# Patient Record
Sex: Female | Born: 1950 | Race: White | Hispanic: No | Marital: Married | Smoking: Former smoker
Health system: Southern US, Community
[De-identification: ages and names within clinical notes are randomized; demographics above are authoritative.]

## PROBLEM LIST (undated history)

## (undated) DIAGNOSIS — H409 Unspecified glaucoma: Secondary | ICD-10-CM

## (undated) DIAGNOSIS — I10 Essential (primary) hypertension: Secondary | ICD-10-CM

## (undated) DIAGNOSIS — E785 Hyperlipidemia, unspecified: Secondary | ICD-10-CM

## (undated) DIAGNOSIS — R0602 Shortness of breath: Secondary | ICD-10-CM

## (undated) DIAGNOSIS — F32A Depression, unspecified: Secondary | ICD-10-CM

## (undated) DIAGNOSIS — K219 Gastro-esophageal reflux disease without esophagitis: Secondary | ICD-10-CM

## (undated) DIAGNOSIS — Z9889 Other specified postprocedural states: Secondary | ICD-10-CM

## (undated) DIAGNOSIS — K589 Irritable bowel syndrome without diarrhea: Secondary | ICD-10-CM

## (undated) DIAGNOSIS — R112 Nausea with vomiting, unspecified: Secondary | ICD-10-CM

## (undated) DIAGNOSIS — F329 Major depressive disorder, single episode, unspecified: Secondary | ICD-10-CM

## (undated) DIAGNOSIS — K429 Umbilical hernia without obstruction or gangrene: Secondary | ICD-10-CM

## (undated) DIAGNOSIS — R9431 Abnormal electrocardiogram [ECG] [EKG]: Secondary | ICD-10-CM

## (undated) DIAGNOSIS — R32 Unspecified urinary incontinence: Secondary | ICD-10-CM

## (undated) DIAGNOSIS — F419 Anxiety disorder, unspecified: Secondary | ICD-10-CM

## (undated) DIAGNOSIS — M1990 Unspecified osteoarthritis, unspecified site: Secondary | ICD-10-CM

## (undated) DIAGNOSIS — M797 Fibromyalgia: Secondary | ICD-10-CM

## (undated) HISTORY — PX: TONSILLECTOMY: SUR1361

## (undated) HISTORY — PX: JOINT REPLACEMENT: SHX530

## (undated) HISTORY — PX: EYE SURGERY: SHX253

## (undated) HISTORY — PX: OTHER SURGICAL HISTORY: SHX169

---
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
## 1988-04-29 HISTORY — PX: ABDOMINAL HYSTERECTOMY: SHX81

## 1989-04-29 HISTORY — PX: OTHER SURGICAL HISTORY: SHX169

## 1998-04-29 HISTORY — PX: OTHER SURGICAL HISTORY: SHX169

## 2001-08-13 ENCOUNTER — Other Ambulatory Visit: Admission: RE | Admit: 2001-08-13 | Discharge: 2001-08-13 | Payer: Self-pay | Admitting: Gynecology

## 2001-10-02 ENCOUNTER — Encounter: Admission: RE | Admit: 2001-10-02 | Discharge: 2001-10-02 | Payer: Self-pay | Admitting: Gynecology

## 2001-10-02 ENCOUNTER — Encounter: Payer: Self-pay | Admitting: Gynecology

## 2001-10-08 ENCOUNTER — Encounter: Payer: Self-pay | Admitting: Gynecology

## 2001-10-08 ENCOUNTER — Encounter: Admission: RE | Admit: 2001-10-08 | Discharge: 2001-10-08 | Payer: Self-pay | Admitting: Gynecology

## 2001-11-16 ENCOUNTER — Inpatient Hospital Stay (HOSPITAL_COMMUNITY): Admission: RE | Admit: 2001-11-16 | Discharge: 2001-11-17 | Payer: Self-pay | Admitting: Gynecology

## 2002-10-12 ENCOUNTER — Encounter: Admission: RE | Admit: 2002-10-12 | Discharge: 2002-10-12 | Payer: Self-pay | Admitting: Gynecology

## 2002-10-12 ENCOUNTER — Encounter: Payer: Self-pay | Admitting: Gynecology

## 2003-10-13 ENCOUNTER — Encounter: Admission: RE | Admit: 2003-10-13 | Discharge: 2003-10-13 | Payer: Self-pay | Admitting: Obstetrics and Gynecology

## 2004-02-07 ENCOUNTER — Ambulatory Visit (HOSPITAL_COMMUNITY): Admission: RE | Admit: 2004-02-07 | Discharge: 2004-02-07 | Payer: Self-pay | Admitting: Gastroenterology

## 2004-10-31 ENCOUNTER — Encounter: Admission: RE | Admit: 2004-10-31 | Discharge: 2004-10-31 | Payer: Self-pay | Admitting: Occupational Therapy

## 2005-04-29 HISTORY — PX: CHOLECYSTECTOMY: SHX55

## 2005-11-06 ENCOUNTER — Encounter: Admission: RE | Admit: 2005-11-06 | Discharge: 2005-11-06 | Payer: Self-pay | Admitting: Family Medicine

## 2005-11-19 ENCOUNTER — Encounter: Admission: RE | Admit: 2005-11-19 | Discharge: 2005-11-19 | Payer: Self-pay | Admitting: Family Medicine

## 2006-05-22 ENCOUNTER — Encounter: Admission: RE | Admit: 2006-05-22 | Discharge: 2006-05-22 | Payer: Self-pay | Admitting: Family Medicine

## 2006-11-10 ENCOUNTER — Encounter: Admission: RE | Admit: 2006-11-10 | Discharge: 2006-11-10 | Payer: Self-pay | Admitting: Family Medicine

## 2007-11-12 ENCOUNTER — Encounter: Admission: RE | Admit: 2007-11-12 | Discharge: 2007-11-12 | Payer: Self-pay | Admitting: Nurse Practitioner

## 2008-11-21 ENCOUNTER — Encounter: Admission: RE | Admit: 2008-11-21 | Discharge: 2008-11-21 | Payer: Self-pay | Admitting: Nurse Practitioner

## 2009-11-22 ENCOUNTER — Encounter: Admission: RE | Admit: 2009-11-22 | Discharge: 2009-11-22 | Payer: Self-pay | Admitting: Nurse Practitioner

## 2010-09-14 NOTE — Discharge Summary (Signed)
NAME:  [name], [name]                       ACCOUNT NO.:  [id number]   MEDICAL RECORD NO.:  [id number]                   PATIENT TYPE:  INP   LOCATION:  1478                                 FACILITY:  [facility]   PHYSICIAN:  Gretta Cool, M.D.              DATE OF BIRTH:  1950/08/01   DATE OF ADMISSION:  11/16/2001  DATE OF DISCHARGE:  11/17/2001                                 DISCHARGE SUMMARY   HISTORY OF PRESENT ILLNESS:  The patient is a 60 year old female, gravida 2,  para 2, who has a history of vaginal childbirth x 2.  She does not recall  difficult vaginal delivery or obstetric injury.  The patient reports over  the last six months or so she has had a progressive bulge at her vagina.  On  examination it was noted that she had a grade 3 rectocele and enterocele.  She was fitted for a pessary, but the device did not relieve her symptoms.  She has a mixed pattern of incontinence with both urgency and stress  components.  This was dramatically improved on vaginal estrogen only over  the last two to three months.  She has a history of vaginal hysterectomy for  endometriosis and a history of laparoscopic surgery for adhesions.  She is  now admitted for definitive therapy by vaginal vault suspension, rectocele  and enterocele repairs.  She understands that on careful testing and bladder  studies she only had 10-15 degrees of rotation with Q-tip test.  She has  reasonably well-supported anterior vaginal vault with no incontinence  demonstrated in the supine position.  She does have evidence of urologic  injury with diminished reflexes on the right, both anterior and posterior,  and the left was intact.  She has been cautioned about repeatedly straining  at stool and is to have bowel habits established at the time of the  procedure and to continue throughout.  Alternative forms of therapy have  been discussed as well as return to estrogen vaginal cream after surgery.   PHYSICAL  EXAMINATION:  CHEST:  Clear to A&P.  HEART:  Rate and rhythm are regular.  Without murmurs, gallops, or cardiac  enlargement.  ABDOMEN:  Soft and scaphoid.  No masses or organomegaly.  There is a  moderate panniculus.  PELVIC:  External genitalia within normal limits for female vagina.  Open  introitus with rectocele immediately noted bulging through the introitus.  The anterior vaginal wall was reasonably well supported.  There is an  enterocele that descends with straining.  The cervix and uterus are  surgically absent, and there is moderate descent of the apex of the cuff.  RECTOVAGINAL:  Confirms.  There is diminished anal sphincter tone.  Urologically the sphincter has diminished reflexes, particularly on the  right.  The left is intact both is weak both anteriorly and posteriorly.   IMPRESSION:  1. Grade 3 enterocele and rectocele.  2.  Vaginal vault descensus.  3. Incontinence, mixed pattern, with history of tension-free vaginal tape     and resolution of her incontinence on vaginal estrogen.  4. Depression, multiagent therapy at [facility].  5. History of vaginal hysterectomy in 1991, without repairs.   PLAN:  Findings and options were discussed with the patient as well as the  risks and benefits of each.  She has failed conservative therapy with  pessary previously.  She is now admitted for enterocele and rectocele  repairs with vaginal vault suspension.   LABORATORY DATA:  Admission hemoglobin 14.2 with hematocrit 40.0.  Chemistries were within normal limits with the exception of an elevated  glucose at 127 preoperatively.   EKG:  Normal sinus rhythm.  Nonspecific changes noted.   HOSPITAL COURSE:  The patient underwent posterior enterocele repairs and  vaginal vault suspension under general anesthesia.  The procedures were  completed without any complications, and the patient was returned to the  recovery room.  Estimated blood loss 300 cc, no replacement needed.  Her   postoperative course was without complications, and she was discharged on  the first postoperative day in excellent condition.   DISCHARGE INSTRUCTIONS:  No heavy lifting or straining.  No vaginal  entrance.  Increase ambulation as tolerated.  She is to call for any fevers  over 100.1 or failure of daily improvement.   DIET:  Regular.   MEDICATIONS:  1. She is to have daily BMs with Dulcolax and Fleet if necessary.  2. Tylox 1 p.o. q.2-4h. p.r.n. discomfort.  3. Vioxx 25 mg daily.   FOLLOW-UP:  She is to return to the office in one week for follow-up.   CONDITION ON DISCHARGE:  Excellent.   DISCHARGE DIAGNOSES:  1. Pelvic organ prolapse with a grade 3 enterocele and rectocele.  2. Vaginal vault descensus.   PROCEDURE:  Posterior enterocele repair and vaginal vault suspension under  general anesthesia.      Matt Holmes, N.P.                          Gretta Cool, M.D.    EMK/MEDQ  D:  11/30/2001  T:  12/05/2001  Job:  [id number]

## 2010-09-14 NOTE — H&P (Signed)
[facility]  Patient:    [name], [name] Visit Number: [id number] MRN: [id number]          Service Type: GYN Location: 4W 0452 01 Attending Physician:  Katrina Stack Dictated by:   Gretta Cool, M.D. Admit Date:  11/16/2001   CC:         Dr. Barnie Alderman, [place]   History and Physical  HISTORY OF PRESENT ILLNESS:  The patient presents for repair of pelvic organ prolapse with enterocele and rectocele predominant.  The patient is a 60 year old gravida 2, para 2, under the primary care of Dr. Mayford Knife of [place], [place], who has a history of vaginal childbirth x 2.  Does not recollect forceps or difficult vaginal delivery or obstetric injury.  Over the last six months or so she has noted a progressive bulge out of her vagina. On evaluation she was noted to have grade 3 enterocele and rectocele.  She was fitted for a pessary but did not like that device even though it helped her symptomatically.  She also had significant difficulty with leakage at night when she was asleep and difficulty with mixed pattern of incontinence with both urgency and stress components.  She was dramatically improved on vaginal estrogen only over the last two to three months.  She also has a history of vaginal hysterectomy by Dr. Pennie Rushing for endometriosis, history of laparoscopy by Dr. Kizzie Bane for adhesions.  She is now admitted for definitive therapy by vault suspension, rectocele, and enterocele repairs.  She understands that on careful testing and bladder studies she has only 10-15 degrees rotation with Q-tip test.  She has reasonably well-supported anterior vaginal wall with no incontinence demonstrable in the supine position.  She does have evidence of neurologic injury with diminished reflexes on the right both anterior and posterior, and the left is weak but intact.  We have discussed the possibility of spinal stenosis and/or obstetric injury with  neurologic impingement.  She understands the significance of those for long-term control.  She has been cautioned repeatedly about straining of stool and is to have daily bowel habits established prior to the procedure and to continue that throughout her recovery and hospitalization.  I have discussed alternative forms of therapy, risks and benefits, short-term and long-term.  I have discussed vaginal estrogen always to help protect her pelvic support weakness.  PAST MEDICAL HISTORY: 1. Usual childhood diseases without sequelae. 2. Most significant medical concern is depression, on multiple medications    for depression and anxiety, including Wellbutrin, clorazepate, Neurontin,    Geodon.  PAST SURGICAL HISTORY: 1. Appendectomy as a child. 2. Hysterectomy in approximately 1991. 3. Laparoscopy and lysis of adhesions in 1992. 4. Total hip replacement, Dr. Trellis Paganini. 5. History of tension-free vaginal tape procedure for incontinence, Dr. Annett Fabian.  PRESENT MEDICATIONS: 1. Medications for depression, as above. 2. Estradiol 1 mg orally. 3. Prilosec 20 mg q.d.  SOCIAL HISTORY:  The patients husband is retired and older.  She is unemployed.  She has two children grown and gone.  HABITS:  Denies ethanol or tobacco use.  Denies recreational drugs.  ALLERGIES:  ERYTHROMYCIN and SULFA.  REVIEW OF SYSTEMS:  HEENT:  Denies symptoms.  CARDIORESPIRATORY:  Denies asthma, bronchitis, shortness of breath.  GASTROINTESTINAL/GENITOURINARY:  The patient has a history of incontinence but mixed pattern, much improved on transdermal estrogen over the last three months, with virtually complete resolution.  History of tension-free vaginal tape suspension procedure, Dr. Jonny Ruiz  [place], [place].  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  PHYSICAL EXAMINATION:  GENERAL:   Well-developed, well-nourished white female.  VITAL SIGNS:  Considerably over ideal weight with high abdomen to hip ratio.  HEENT:  Pupils equal, reactive to light and accommodation.  Fundi not examined.  Oropharynx clear.  NECK:  Supple.  Without mass or thyroid enlargement.  BREASTS:  Soft.  Without mass, nodes, or nipple discharge.  HEART:  Regular rhythm.  Without murmur or cardiac enlargement.  CHEST:  Clear to P&A.  ABDOMEN:  Soft, with moderate panniculus.  Without mass or organomegaly.  PELVIC:  External genitalia normal female.  Vagina, gaping introitus with rectocele immediately noted bulging through the introitus.  The anterior vaginal wall seems reasonably well supported.  She also has an enterocele that descends with straining.  Her cervix and uterus are surgically absent.  There is moderate descent of the apex of the cuff.  RECTOVAGINAL:  Confirms.  There is diminished anal sphincter tone.  NEUROLOGIC:  Diminished reflexes, particularly on the right.  The left is intact but is weak both anterior and posterior.  Neurologic is physiologic.  EXTREMITIES:  Negative.  IMPRESSION: 1. Grade 3 enterocele and rectocele. 2. Vaginal vault descensus. 3. Incontinence, mixed pattern, with history of tension-free vaginal tape and    resolution of her incontinence on vaginal estrogen. 4. Depression, multiagent therapy at [facility]. 5. History of vaginal hysterectomy, Dr. Pennie Rushing, in 1991, without repairs.  PLAN:  Discussed the options with her in total.  I discussed the risks and benefits of each alternative including no therapy.  She has failed pessary previously.  Recommended on to enterocele and rectocele repair with vaginal vault suspension.  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  She understands all  these concerns, is unwilling to continue pessary, and is recommended on to definitive therapy as above. Dictated by:   Gretta Cool, M.D. Attending Physician:  Katrina Stack DD:  11/13/01 TD:  11/17/01 Job: [id number]

## 2010-09-14 NOTE — Op Note (Signed)
[facility]  Patient:    CARSYN, BOSTER Visit Number: [id number] MRN: [id number]          Service Type: GYN Location: 4W 0452 01 Attending Physician:  Katrina Stack Dictated by:   Gretta Cool, M.D. Proc. Date: 11/16/01 Admit Date:  11/16/2001 Discharge Date: 11/17/2001                             Operative Report  PREOPERATIVE DIAGNOSES: 1. Pelvic organ prolapse with grade 3 enterocele and rectocele. 2. Vaginal vault descensus.  POSTOPERATIVE DIAGNOSES: 1. Pelvic organ prolapse with grade 3 enterocele and rectocele. 2. Vaginal vault descensus.  OPERATION:  Posterior enterocele repairs and vaginal vault suspension.  SURGEON:  Gretta Cool, M.D.  ASSISTANT:  Guy Sandifer. Arleta Creek, M.D.  ANESTHESIA:  General orotracheal.  DESCRIPTION OF PROCEDURE:  Under excellent general anesthesia with the patient prepped and draped in lithotomy position in [employer] stirrups, the vagina was grasped at the introitus with Allis clamps on the hymenal ring.  An incision was made posteriorly as a V-shaped incision to excise the scarring of her previous episiotomies.  The mucosa was then undermined from the introitus to the apex of the vault.  At this point, Allises were placed on the cut edge and the perirectal fascia dissected from the mucosa.  Once the defects had been identified, an Lavena Bullion was used to measure the mobility of the fascia.  It was mobilized adequately to allow the fascia that had detached from the apex of the cuff to be returned and sutured to the cardinal uterosacral complex and to the anterior vaginal vault fascia.  At this point the uterosacral cardinal complex was identified on each side.  A suture of 0-Ethibond was then used to secure the uterosacral cardinal complex.  The suture was then passed through the detached fascia and the fascia pulled all the way to the apex of the cuff. The suture was then secured to the ______  cavernosus fascia and the white line fascia of the pelvis.  It was then secured again to the transverse fascia.  At this point, the uterosacral cardinal complex suspension sutures were tied.  The fascia was returned to its previous location from which it had detached with childbirth.  A complete envelope of fascia was then secured to the cuff fascia with interrupted sutures of 0-Ethibond.  At this point, the fascia was plicated in the midline from the apex of the vault to the introitus with a running suture of 0-Vicryl.  A second suture of 0-Vicryl was used to plicate the upper layers of endopelvic fascia and trimmed mucosa.  The perineal body muscles were then repaired and the perineal body closed with subcuticular sutures.  The introitus remained adequate by digital.  At this point, the procedure was terminated without complication.  Estimated blood loss was 300 cc, replacement none.  Complications none. Dictated by:   Gretta Cool, M.D. Attending Physician:  Katrina Stack DD:  11/16/01 TD:  11/18/01 Job: [id number]

## 2010-10-30 ENCOUNTER — Other Ambulatory Visit: Payer: Self-pay | Admitting: Nurse Practitioner

## 2010-10-30 DIAGNOSIS — Z1231 Encounter for screening mammogram for malignant neoplasm of breast: Secondary | ICD-10-CM

## 2010-12-05 ENCOUNTER — Ambulatory Visit
Admission: RE | Admit: 2010-12-05 | Discharge: 2010-12-05 | Disposition: A | Discharge status: Home / Self Care | Payer: PRIVATE HEALTH INSURANCE | Source: Ambulatory Visit | Attending: Nurse Practitioner | Admitting: Nurse Practitioner

## 2010-12-05 DIAGNOSIS — Z1231 Encounter for screening mammogram for malignant neoplasm of breast: Secondary | ICD-10-CM

## 2010-12-11 ENCOUNTER — Other Ambulatory Visit: Payer: Self-pay | Admitting: Nurse Practitioner

## 2010-12-11 DIAGNOSIS — R928 Other abnormal and inconclusive findings on diagnostic imaging of breast: Secondary | ICD-10-CM

## 2010-12-20 ENCOUNTER — Ambulatory Visit
Admission: RE | Admit: 2010-12-20 | Discharge: 2010-12-20 | Disposition: A | Discharge status: Home / Self Care | Payer: PRIVATE HEALTH INSURANCE | Source: Ambulatory Visit | Attending: Nurse Practitioner | Admitting: Nurse Practitioner

## 2010-12-20 DIAGNOSIS — R928 Other abnormal and inconclusive findings on diagnostic imaging of breast: Secondary | ICD-10-CM

## 2011-01-31 ENCOUNTER — Other Ambulatory Visit: Payer: Self-pay | Admitting: Orthopedic Surgery

## 2011-01-31 ENCOUNTER — Encounter (HOSPITAL_COMMUNITY): Payer: PRIVATE HEALTH INSURANCE

## 2011-01-31 ENCOUNTER — Ambulatory Visit (HOSPITAL_COMMUNITY)
Admission: RE | Admit: 2011-01-31 | Discharge: 2011-01-31 | Disposition: A | Discharge status: Home / Self Care | Payer: PRIVATE HEALTH INSURANCE | Source: Ambulatory Visit | Attending: Orthopedic Surgery | Admitting: Orthopedic Surgery

## 2011-01-31 ENCOUNTER — Other Ambulatory Visit (HOSPITAL_COMMUNITY): Payer: Self-pay | Admitting: Orthopedic Surgery

## 2011-01-31 DIAGNOSIS — Z01810 Encounter for preprocedural cardiovascular examination: Secondary | ICD-10-CM | POA: Insufficient documentation

## 2011-01-31 DIAGNOSIS — Z01818 Encounter for other preprocedural examination: Secondary | ICD-10-CM

## 2011-01-31 DIAGNOSIS — M1710 Unilateral primary osteoarthritis, unspecified knee: Secondary | ICD-10-CM | POA: Insufficient documentation

## 2011-01-31 DIAGNOSIS — Z01812 Encounter for preprocedural laboratory examination: Secondary | ICD-10-CM | POA: Insufficient documentation

## 2011-01-31 DIAGNOSIS — I10 Essential (primary) hypertension: Secondary | ICD-10-CM | POA: Insufficient documentation

## 2011-01-31 LAB — URINALYSIS, ROUTINE W REFLEX MICROSCOPIC
Bilirubin Urine: NEGATIVE
Glucose, UA: NEGATIVE mg/dL
Hgb urine dipstick: NEGATIVE
Ketones, ur: NEGATIVE mg/dL
Leukocytes, UA: NEGATIVE
Nitrite: NEGATIVE
Protein, ur: NEGATIVE mg/dL
Specific Gravity, Urine: 1.02 (ref 1.005–1.030)
Urobilinogen, UA: 0.2 mg/dL (ref 0.0–1.0)
pH: 6 (ref 5.0–8.0)

## 2011-01-31 LAB — CBC
HCT: 37.5 % (ref 36.0–46.0)
Hemoglobin: 12.7 g/dL (ref 12.0–15.0)
MCH: 30.2 pg (ref 26.0–34.0)
MCHC: 33.9 g/dL (ref 30.0–36.0)
MCV: 89.3 fL (ref 78.0–100.0)
Platelets: 261 10*3/uL (ref 150–400)
RBC: 4.2 MIL/uL (ref 3.87–5.11)
RDW: 12.2 % (ref 11.5–15.5)
WBC: 6.7 10*3/uL (ref 4.0–10.5)

## 2011-01-31 LAB — PROTIME-INR
INR: 1.02 (ref 0.00–1.49)
Prothrombin Time: 13.6 seconds (ref 11.6–15.2)

## 2011-01-31 LAB — BASIC METABOLIC PANEL
BUN: 14 mg/dL (ref 6–23)
CO2: 27 mEq/L (ref 19–32)
Calcium: 9.9 mg/dL (ref 8.4–10.5)
Chloride: 101 mEq/L (ref 96–112)
Creatinine, Ser: 0.7 mg/dL (ref 0.50–1.10)
GFR calc Af Amer: >90 mL/min (ref >90)
GFR calc non Af Amer: >90 mL/min (ref >90)
Glucose, Bld: 90 mg/dL (ref 70–99)
Potassium: 3.3 mEq/L — ABNORMAL LOW (ref 3.5–5.1)
Sodium: 139 mEq/L (ref 135–145)

## 2011-01-31 LAB — DIFFERENTIAL
Basophils Absolute: 0 10*3/uL (ref 0.0–0.1)
Basophils Relative: 0 % (ref 0–1)
Eosinophils Absolute: 0.2 10*3/uL (ref 0.0–0.7)
Eosinophils Relative: 3 % (ref 0–5)
Lymphocytes Relative: 30 % (ref 12–46)
Lymphs Abs: 2 10*3/uL (ref 0.7–4.0)
Monocytes Absolute: 0.4 10*3/uL (ref 0.1–1.0)
Monocytes Relative: 7 % (ref 3–12)
Neutro Abs: 4 10*3/uL (ref 1.7–7.7)
Neutrophils Relative %: 60 % (ref 43–77)

## 2011-01-31 LAB — APTT: aPTT: 30 seconds (ref 24–37)

## 2011-01-31 LAB — SURGICAL PCR SCREEN
MRSA, PCR: NEGATIVE
Staphylococcus aureus: NEGATIVE

## 2011-02-05 ENCOUNTER — Inpatient Hospital Stay (HOSPITAL_COMMUNITY)
Admission: RE | Admit: 2011-02-05 | Discharge: 2011-02-07 | DRG: 470 | Disposition: A | Discharge status: Home Health | Payer: PRIVATE HEALTH INSURANCE | Source: Ambulatory Visit | Attending: Orthopedic Surgery | Admitting: Orthopedic Surgery

## 2011-02-05 DIAGNOSIS — H409 Unspecified glaucoma: Secondary | ICD-10-CM | POA: Diagnosis present

## 2011-02-05 DIAGNOSIS — Z01810 Encounter for preprocedural cardiovascular examination: Secondary | ICD-10-CM

## 2011-02-05 DIAGNOSIS — Z79890 Hormone replacement therapy (postmenopausal): Secondary | ICD-10-CM

## 2011-02-05 DIAGNOSIS — F341 Dysthymic disorder: Secondary | ICD-10-CM | POA: Diagnosis present

## 2011-02-05 DIAGNOSIS — Z01812 Encounter for preprocedural laboratory examination: Secondary | ICD-10-CM

## 2011-02-05 DIAGNOSIS — I10 Essential (primary) hypertension: Secondary | ICD-10-CM | POA: Diagnosis present

## 2011-02-05 DIAGNOSIS — K3184 Gastroparesis: Secondary | ICD-10-CM | POA: Diagnosis present

## 2011-02-05 DIAGNOSIS — M1710 Unilateral primary osteoarthritis, unspecified knee: Principal | ICD-10-CM | POA: Diagnosis present

## 2011-02-05 DIAGNOSIS — K589 Irritable bowel syndrome without diarrhea: Secondary | ICD-10-CM | POA: Diagnosis present

## 2011-02-05 DIAGNOSIS — Z87891 Personal history of nicotine dependence: Secondary | ICD-10-CM

## 2011-02-05 DIAGNOSIS — E669 Obesity, unspecified: Secondary | ICD-10-CM | POA: Diagnosis present

## 2011-02-05 DIAGNOSIS — IMO0001 Reserved for inherently not codable concepts without codable children: Secondary | ICD-10-CM | POA: Diagnosis present

## 2011-02-05 DIAGNOSIS — Z79899 Other long term (current) drug therapy: Secondary | ICD-10-CM

## 2011-02-05 DIAGNOSIS — R32 Unspecified urinary incontinence: Secondary | ICD-10-CM | POA: Diagnosis present

## 2011-02-05 DIAGNOSIS — Z96649 Presence of unspecified artificial hip joint: Secondary | ICD-10-CM

## 2011-02-05 DIAGNOSIS — Z7982 Long term (current) use of aspirin: Secondary | ICD-10-CM

## 2011-02-05 DIAGNOSIS — K219 Gastro-esophageal reflux disease without esophagitis: Secondary | ICD-10-CM | POA: Diagnosis present

## 2011-02-05 LAB — TYPE AND SCREEN
ABO/RH(D): A POS
Antibody Screen: NEGATIVE

## 2011-02-05 LAB — ABO/RH: ABO/RH(D): A POS

## 2011-02-05 NOTE — Op Note (Signed)
[name], [name]             ACCOUNT NO.:  [id number]  MEDICAL RECORD NO.:  [id number]  LOCATION:  0005                         FACILITY:  [facility]  PHYSICIAN:  Madlyn Frankel. Charlann Boxer, M.D.  DATE OF BIRTH:  1950/09/24  DATE OF PROCEDURE:  02/05/2011 DATE OF DISCHARGE:                              OPERATIVE REPORT   PREOPERATIVE DIAGNOSIS:  Right knee osteoarthritis.  POSTOPERATIVE DIAGNOSIS:  Right knee osteoarthritis.  PROCEDURE:  Right total knee replacement.  COMPONENTS USED:  DePuy rotating platform posterior stabilized knee system with size 3 femur, 2.5 tibia, 10 mm insert to match the 3 femur and a 35 patellar button.  SURGEON:  Madlyn Frankel. Charlann Boxer, M.D.  ASSISTANT:  Jaquelyn Bitter. Chabon, P.A.  ANESTHESIA:  Spinal.  SPECIMENS:  None.  COMPLICATION:  None.  DRAINS:  One Hemovac.  BLOOD LOSS:  550 cc.  TOURNIQUET:  Let down after 28 minutes at 250 mmHg.  INDICATION FOR PROCEDURE:  [name] is a 60 year old patient of mine, I have been following for sometime.  She has had advanced degenerative changes in right knee that we were trying to manage conservatively.  She got into the point where she had failed to respond to conservative measures.  At this point, she was ready for more definitive measures. We discussed the risks of infection, DVT, component failure, need for revision surgery, in addition to the postoperative course and expectations.  She at this point was ready to proceed.  Consent was obtained for benefit of pain relief.  PROCEDURE IN DETAIL:  The patient was brought to operative theater. Once adequate anesthesia, preoperative antibiotics, Ancef administered, the patient was positioned supine with the right thigh tourniquet placed.  The right lower extremity was then prepped and draped in sterile fashion with the right foot placed in [facility] leg holder.  Time- out was performed identifying the patient, planned procedure, and extremity.  A midline incision  was made followed by a median arthrotomy, following initial exposure of proximal medial peel and debridement, attention was first directed to the patella.  Precut measurement was 22 mm.  I resected down to about 14 mm and used a 38 patellar button to restore patellar height as well as to cover the cut surface.  Lug holes were drilled and a metal shim was placed to protect the patella.  Attention was now directed to the femur where she was noted to have bone- on-bone changes in the medial compartment of the knee as well as significant changes within the patellofemoral compartment.  The femoral canal was opened with a drill, irrigated to try to prevent fat emboli. An intramedullary rod was passed at 3 degrees of valgus, 10 mm of bone was resected off the distal femur.  The tibia was then subluxated anteriorly and using an extramedullary guide, a measured resection of 10 mm of proximal lateral tibia was carried out.  At this point, we confirmed the gap would be stable medially and laterally with a 10 mm insert as well as confirmed the cut was perpendicular in the coronal plane, checking with an alignment rod.  I then sized the femur to be a size 3 and size 3 rotation block was pinned in  position, anterior reference using the C-clamp to set rotation.  The anterior, posterior, and chamfer cuts were then made without difficulty nor notching.  Final box cut made off the lateral aspect of distal femur.  The tibia was resubluxated anteriorly and the cut surface was best fit with size 2.5 tibial tray, was pinned through the medial third of the tubercle, drilled and keel punched.  Trial reduction was carried out with 3 femur, 2.5 tibia, and a 10 mm insert. The patella was noted to track through the trochlea without application of pressure.  The medial and lateral collateral ligaments were intact and balanced and stable from extension and flexion.  Given all these findings, the trial components  were removed.  Following the synovectomy, the synovial capsule junction of  knee was injected with 0.25% Marcaine with epinephrine and 1 cc of Toradol.  The final components were opened and cement was  mixed.  The final components were then cemented on to clean and dried cut surfaces of bone.  The knee was brought to extension with a 10-mm trial insert.  The extruded cement was removed.  Once the cement had fully cured and excessive cement was removed throughout the knee, the final 10-mm insert was chosen and placed into the knee.  Tourniquet had been let down after 28 minutes without significant hemostasis required.  The medium Hemovac drain was placed deep.  The knee was re-irrigated with normal saline solution and pulse lavage and the capsule was then reapproximated using #1 Vicryl with the knee in flexion.  The remaining wound was closed with 2-0 Vicryl and running 4-0 Monocryl.  Please note, physician assistant, Jaquelyn Bitter. Chabon, was present for the entirety of the case, utilized for preoperative positioning and preoperative retractor management and general facilitation of the case. He was also involved with primary wound closure.   Madlyn Frankel Charlann Boxer, M.D.     MDO/MEDQ  D:  02/05/2011  T:  02/05/2011  Job:  [id number]  Electronically Signed by Durene Romans M.D. on 02/05/2011 04:16:26 PM

## 2011-02-06 LAB — BASIC METABOLIC PANEL WITH GFR
BUN: 8 mg/dL (ref 6–23)
CO2: 31 meq/L (ref 19–32)
Calcium: 8.1 mg/dL — ABNORMAL LOW (ref 8.4–10.5)
Chloride: 99 meq/L (ref 96–112)
Creatinine, Ser: 0.55 mg/dL (ref 0.50–1.10)
GFR calc Af Amer: >90 mL/min
GFR calc non Af Amer: >90 mL/min
Glucose, Bld: 120 mg/dL — ABNORMAL HIGH (ref 70–99)
Potassium: 3.6 meq/L (ref 3.5–5.1)
Sodium: 135 meq/L (ref 135–145)

## 2011-02-06 LAB — CBC
HCT: 28.7 % — ABNORMAL LOW (ref 36.0–46.0)
Hemoglobin: 9.7 g/dL — ABNORMAL LOW (ref 12.0–15.0)
MCH: 30.4 pg (ref 26.0–34.0)
MCHC: 33.8 g/dL (ref 30.0–36.0)
MCV: 90 fL (ref 78.0–100.0)
Platelets: 217 10*3/uL (ref 150–400)
RBC: 3.19 MIL/uL — ABNORMAL LOW (ref 3.87–5.11)
RDW: 12.2 % (ref 11.5–15.5)
WBC: 5.5 10*3/uL (ref 4.0–10.5)

## 2011-02-07 LAB — BASIC METABOLIC PANEL
Calcium: 8.1 mg/dL — ABNORMAL LOW (ref 8.4–10.5)
GFR calc Af Amer: >90 mL/min (ref >90)
GFR calc non Af Amer: >90 mL/min (ref >90)
Sodium: 131 mEq/L — ABNORMAL LOW (ref 135–145)

## 2011-02-07 LAB — CBC
MCH: 31 pg (ref 26.0–34.0)
MCHC: 34.8 g/dL (ref 30.0–36.0)
Platelets: 216 10*3/uL (ref 150–400)
RBC: 3.03 MIL/uL — ABNORMAL LOW (ref 3.87–5.11)
RDW: 12.3 % (ref 11.5–15.5)

## 2011-02-11 NOTE — Discharge Summary (Signed)
[name], [name]             ACCOUNT NO.:  [id number]  MEDICAL RECORD NO.:  [id number]  LOCATION:  1620                         FACILITY:  [facility]  PHYSICIAN:  Madlyn Frankel. Charlann Boxer, M.D.  DATE OF BIRTH:  1950/07/23  DATE OF ADMISSION:  02/05/2011 DATE OF DISCHARGE:  02/07/2011                              DISCHARGE SUMMARY   ADMITTING DIAGNOSIS:  Right knee osteoarthritis.  DISCHARGE DIAGNOSES: 1. Right knee osteoarthritis. 2. Anxiety/depression. 3. Hypertension. 4. Reflux disease. 5. History of urinary incontinence. 6. Fibromyalgia.  BRIEF HISTORY:  [name] is a 60 year old patient of mine, longstanding with various arthritic conditions.  She has had right knee osteoarthritis for a longstanding period of time that we have currently been trying to manage conservatively.  She has failed conservative measures at this point including injections, viscosupplementation.  She was ready to proceed with more definitive measures.  Risks and benefits were reviewed in the office setting, consent was obtained based on these discussions.  HOSPITAL COURSE:  The patient admitted for same-day surgery on February 05, 2011.  Please see dictated operative note for full details of the procedure.  She underwent an uncomplicated right total knee replacement. After routine stay in the recovery room, she was transferred to the orthopedic ward where she remained for a 2-day hospital stay.  Postop day 1, she had a Hemovac drain removed and Foley catheter removed.  She was seen and evaluated by Physical Therapy.  She was placed initially on Norco 7.5/325, which was not managing her pain well based on her chronic pain issues and I changed this up to 10 mg tablets 1 to 2 every 4 to 6 hours.  By postop day 2, she was doing well with regards to her pain.  Her hematocrit was stable at 27.  Her platelets were 216.  She did have a potassium of 3.0 which I replaced with the K-Dur prior to discharge.  On  postop day 2, she was ready for discharge home.  Ace wrap was removed, her dressing was dry.  She has a current Aquacel dressing in place.  DISCHARGE CONDITION:  Stable at time of discharge.  DISCHARGE INSTRUCTIONS:  The patient is to be seen and evaluated by home health.  Physical Therapy will work on range of motion, strengthening, and gait training.  She will keep her Aquacel dressing in place for a total of 8 days, then remove and cover with gauze and tape.  She will keep her wound dry until followup.  She will return to see Dr. Durene Romans at [facility], [phone], in 2 weeks.  Time of appointment already set.  DISCHARGE MEDICATIONS: 1. Colace 100 mg p.o. b.i.d. as needed for constipation while on pain     medicine. 2. Aspirin 325 mg b.i.d. for 30 days.  After her 2 weeks of 325 mg of     aspirin, she will return to an 81 mg dose. 3. Iron 325 mg 2 to 3 times a day as tolerated for 2 weeks. 4. Norco 10/325 one to two tablets every 4 to 6 hours as needed for     pain. 5. Robaxin 500 mg p.o. q.6 hours as needed for muscle spasm  and pain. 6. MiraLax 17 g once or twice a day as needed for constipation while     on pain medicine. 7. Calcium twice a day. 8. Celebrex once or twice a day as needed for pain. 9. Clonazepam 0.5 mg 1 to 2 tablets as needed. 10.Fish oil 1000 mg b.i.d. 11.Lisinopril/hydrochlorothiazide 20/12.5 two tablets daily. 12.Loratadine 10 mg at bedtime. 13.Multivitamin p.o. daily. 14.Neurontin 600 mg b.i.d. 15.Pravachol 40 mg at bedtime. 16.Prilosec 20 mg q.a.m. 17.Proctozone cream topically daily. 18.Ranitidine 300 mg at bedtime. 19.Risperdal 0.25 mg at bedtime. 20.Vitamin D3 at bedtime. 21.Vivelle transdermal patches. 22.Wellbutrin SR 150 mg b.i.d. 23.Zoloft 150 mg q.a.m. Questions were encouraged, answers were reviewed with her at the time of discharge.     Madlyn Frankel Charlann Boxer, M.D.     MDO/MEDQ  D:  02/07/2011  T:  02/07/2011  Job:   [id number]  Electronically Signed by Durene Romans M.D. on 02/11/2011 08:50:34 AM

## 2011-02-18 NOTE — H&P (Signed)
[name], [name]             ACCOUNT NO.:  [id number]  MEDICAL RECORD NO.:  [id number]  LOCATION:                               FACILITY:  [facility]  PHYSICIAN:  Madlyn Frankel. Charlann Boxer, M.D.  DATE OF BIRTH:  03-17-1951  DATE OF ADMISSION:  02/05/2011 DATE OF DISCHARGE:                             HISTORY & PHYSICAL   DATE OF SURGERY:  February 05, 2011.  CHIEF COMPLAINT:  Right knee osteoarthritis.  HISTORY OF PRESENT ILLNESS:  The patient is a 60 year old white female in no acute distress.  The patient states that she has had right knee pain for about 5 years that has been progressing since that time.  The patient has had anti-inflammatories and steroid injections, which helped at first.  This became less effective as time went on.  The patient has also had series of Synvisc injections, which were ineffective in controlling her symptoms.  X-rays in the clinic do show end-stage osteoarthritis of the right knee.  Various options were discussed with the patient.  The patient wished to proceed with surgery.  Risks, benefits, and expectations of procedure were discussed with the patient. The patient understands the risks, benefits, and expectations and wishes to proceed with a right total knee arthroplasty per Dr. Charlann Boxer at [facility].  Upon discharge, the patient will be going home.  The patient was given her postop medications of aspirin, Robaxin, iron, Colace, and MiraLax. The patient is a candidate for tranexamic acid and will be given this prior to surgery.  PRIMARY CARE PHYSICIAN:  Ninfa Linden, FNP  PAST MEDICAL HISTORY: 1. Anxiety/depression. 2. Glaucoma. 3. Hypertension. 4. Reflux disease. 5. IBS 6. Urinary incontinence. 7. Fibromyalgia.  PAST SURGICAL HISTORY: 1. Appendectomy. 2. Hysterectomy. 3. Laparotomy - scar tissue removal. 4. Total hip replacement on the left. 5. Transvaginal tape. 6. Vaginal repair. 7. Laser eye surgery. 8.  Cholecystectomy.  MEDICATIONS: 1. Clonazepam 0.5 mg q.i.d. p.r.n. 2. Bupropion 150 mg b.i.d. 3. Sertraline 50 mg t.i.d. 4. Risperidone 0.25 mg daily. 5. Pravastatin 40 mg 1 p.o. daily. 6. Lisinopril/HCTZ 2 daily. 7. Celebrex 200 mg daily (stop before surgery). 8. Omeprazole 20 mg daily. 9. Multivitamin daily. 10.Calcium plus D 600 mg daily. 11.Ranitidine 300 mg q.h.s. 12.Fish oil 1200 mg daily (start prior to surgery). 13.CoQ10, 50 mg p.o. daily (stop before surgery). 14.Vivelle-Dot 1 mg patch 2 per week 15.Aspirin 81 mg 1 p.o. daily (start prior to surgery). 16.D3, 2000 international units 1 p.o. daily.  ALLERGIES: 1. ERYTHROMYCIN. 2. SULFA  SOCIAL HISTORY:  The patient admits to smoke in the past.  Denies smoking at this current time.  The patient also denies the use of alcohol.  REVIEW OF SYSTEMS:  HEENT:  The patient complains of balance problems. GI:  Complains of heartburn.  GU:  Complains of incontinence. MUSCULOSKELETAL:  Complains of joint pain, back pain, morning stiffness.  PHYSICAL EXAMINATION:  GENERAL:  The patient is a 60 year old white female in no acute distress. VITAL SIGNS:  Stable.  Blood pressure in left arm is 132/86, respirations 18, pulse is 84. HEENT:  Pupils are equal, round, reactive to light and accommodation. Throat is clear. NECK:  Supple.  No JVD.  No carotid bruits noted.  No lymphadenopathy noted. CARDIAC:  Normal-appearing S1 and S2.  No murmur appreciated. RESPIRATORY:  Lungs clear to auscultation bilaterally. NEURO:  The patient is oriented x3. ORTHO:  Pertaining to the right knee, the patient does have some pain on palpation of the anteromedial tibial surface.  No pain of the medial lateral joint lines.  No tenderness over the quadriceps tendon, patellar tendon, or patella.  The patient has extension to about 5 degrees, flexion to about  110.  Painful range of motion, ambulation.  The patient is distally neurovascularly intact.   Posterior dorsalis pedis pulse.  Surgical site looks clear.  IMPRESSION:  Right knee osteoarthritis.  PLAN:  The patient admitted to the hospital to undergo right total knee arthroplasty per Dr. Charlann Boxer at [facility] on February 05, 2011. Risks, benefits, and expectations of the procedure were discussed with the patient.  The patient understands the risks, benefits, and expectations, and wishes to proceed with surgery.    ______________________________ Lanney Gins, PA   ______________________________ Madlyn Frankel. Charlann Boxer, M.D.    MB/MEDQ  D:  01/30/2011  T:  01/30/2011  Job:  [id number]  Electronically Signed by Lanney Gins PA on 02/12/2011 08:29:47 AM Electronically Signed by Durene Romans M.D. on 02/18/2011 12:28:38 PM

## 2011-08-16 ENCOUNTER — Other Ambulatory Visit: Payer: Self-pay | Admitting: Otolaryngology

## 2011-08-16 ENCOUNTER — Ambulatory Visit
Admission: RE | Admit: 2011-08-16 | Discharge: 2011-08-16 | Disposition: A | Discharge status: Home / Self Care | Payer: PRIVATE HEALTH INSURANCE | Source: Ambulatory Visit | Attending: Otolaryngology | Admitting: Otolaryngology

## 2011-08-16 DIAGNOSIS — J320 Chronic maxillary sinusitis: Secondary | ICD-10-CM

## 2011-08-28 ENCOUNTER — Other Ambulatory Visit: Payer: Self-pay | Admitting: Otolaryngology

## 2011-11-12 ENCOUNTER — Other Ambulatory Visit: Payer: Self-pay | Admitting: Nurse Practitioner

## 2011-11-12 DIAGNOSIS — Z1231 Encounter for screening mammogram for malignant neoplasm of breast: Secondary | ICD-10-CM

## 2011-12-09 ENCOUNTER — Ambulatory Visit
Admission: RE | Admit: 2011-12-09 | Discharge: 2011-12-09 | Disposition: A | Discharge status: Home / Self Care | Payer: PRIVATE HEALTH INSURANCE | Source: Ambulatory Visit | Attending: Nurse Practitioner | Admitting: Nurse Practitioner

## 2011-12-09 DIAGNOSIS — Z1231 Encounter for screening mammogram for malignant neoplasm of breast: Secondary | ICD-10-CM

## 2012-02-14 ENCOUNTER — Encounter (HOSPITAL_COMMUNITY): Payer: Self-pay | Admitting: Pharmacy Technician

## 2012-02-17 ENCOUNTER — Encounter (HOSPITAL_COMMUNITY): Payer: Self-pay

## 2012-02-18 ENCOUNTER — Encounter (HOSPITAL_COMMUNITY): Payer: Self-pay

## 2012-02-18 ENCOUNTER — Ambulatory Visit (HOSPITAL_COMMUNITY)
Admission: RE | Admit: 2012-02-18 | Discharge: 2012-02-18 | Disposition: A | Discharge status: Home / Self Care | Payer: PRIVATE HEALTH INSURANCE | Source: Ambulatory Visit | Attending: Orthopedic Surgery | Admitting: Orthopedic Surgery

## 2012-02-18 ENCOUNTER — Encounter (HOSPITAL_COMMUNITY)
Admission: RE | Admit: 2012-02-18 | Discharge: 2012-02-18 | Disposition: A | Discharge status: Home / Self Care | Payer: PRIVATE HEALTH INSURANCE | Source: Ambulatory Visit | Attending: Orthopedic Surgery | Admitting: Orthopedic Surgery

## 2012-02-18 DIAGNOSIS — I10 Essential (primary) hypertension: Secondary | ICD-10-CM | POA: Insufficient documentation

## 2012-02-18 HISTORY — DX: Irritable bowel syndrome, unspecified: K58.9

## 2012-02-18 HISTORY — DX: Other specified postprocedural states: R11.2

## 2012-02-18 HISTORY — DX: Major depressive disorder, single episode, unspecified: F32.9

## 2012-02-18 HISTORY — DX: Other specified postprocedural states: Z98.890

## 2012-02-18 HISTORY — DX: Hyperlipidemia, unspecified: E78.5

## 2012-02-18 HISTORY — DX: Abnormal electrocardiogram (ECG) (EKG): R94.31

## 2012-02-18 HISTORY — DX: Essential (primary) hypertension: I10

## 2012-02-18 HISTORY — DX: Unspecified glaucoma: H40.9

## 2012-02-18 HISTORY — DX: Umbilical hernia without obstruction or gangrene: K42.9

## 2012-02-18 HISTORY — DX: Gastro-esophageal reflux disease without esophagitis: K21.9

## 2012-02-18 HISTORY — DX: Shortness of breath: R06.02

## 2012-02-18 HISTORY — DX: Anxiety disorder, unspecified: F41.9

## 2012-02-18 HISTORY — DX: Unspecified urinary incontinence: R32

## 2012-02-18 HISTORY — DX: Depression, unspecified: F32.A

## 2012-02-18 HISTORY — DX: Unspecified osteoarthritis, unspecified site: M19.90

## 2012-02-18 HISTORY — DX: Fibromyalgia: M79.7

## 2012-02-18 LAB — BASIC METABOLIC PANEL
Calcium: 9.6 mg/dL (ref 8.4–10.5)
GFR calc non Af Amer: 87 mL/min — ABNORMAL LOW (ref >90)
Potassium: 3.6 mEq/L (ref 3.5–5.1)
Sodium: 135 mEq/L (ref 135–145)

## 2012-02-18 LAB — TYPE AND SCREEN
ABO/RH(D): A POS
Antibody Screen: NEGATIVE

## 2012-02-18 LAB — SURGICAL PCR SCREEN: Staphylococcus aureus: INVALID — AB

## 2012-02-18 LAB — CBC
Hemoglobin: 12.5 g/dL (ref 12.0–15.0)
Platelets: 247 10*3/uL (ref 150–400)
RBC: 4.06 MIL/uL (ref 3.87–5.11)
WBC: 5 10*3/uL (ref 4.0–10.5)

## 2012-02-18 LAB — PROTIME-INR
INR: 0.98 (ref 0.00–1.49)
Prothrombin Time: 12.9 seconds (ref 11.6–15.2)

## 2012-02-18 LAB — APTT: aPTT: 27 seconds (ref 24–37)

## 2012-02-18 LAB — URINALYSIS, ROUTINE W REFLEX MICROSCOPIC
Bilirubin Urine: NEGATIVE
Hgb urine dipstick: NEGATIVE
Ketones, ur: NEGATIVE mg/dL
Protein, ur: NEGATIVE mg/dL
Specific Gravity, Urine: 1.01 (ref 1.005–1.030)
Urobilinogen, UA: 0.2 mg/dL (ref 0.0–1.0)

## 2012-02-18 NOTE — Pre-Procedure Instructions (Signed)
EKG REPORT ON PT'S CHART FROM [facility]-IT WAS DONE 02/10/12. ECHOCARDIOGRAM REPORT AND CARDIOLOGY OFFICE NOTE ON PT'S CHART FROM 02/14/12 AND NOTE OF CARDIAC CLEARANCE. CBC, BMET, PT, PTT, UA, CXR WERE DONE TODAY - PREOP- AT [facility] AS PER ORDERS DR. OLIN AND ANESTHESIOLOGIST'S GUIDELINES.

## 2012-02-18 NOTE — H&P (Signed)
TOTAL KNEE ADMISSION H&P  Patient is being admitted for left total knee arthroplasty.  Subjective:  Chief Complaint:  Left knee OA / pain.  HPI: [name], 61 y.o. female, has a history of pain and functional disability in the left knee due to arthritis and has failed non-surgical conservative treatments for greater than 12 weeks to includeNSAID's and/or analgesics, corticosteriod injections, use of assistive devices and activity modification.  Onset of symptoms was gradual, starting 2 years ago with rapidlly worsening course since that time. The patient noted no past surgery on the left knee(s).  Patient currently rates pain in the left knee(s) at 9 out of 10 with activity. Patient has pain that interferes with activities of daily living and pain with passive range of motion.  Patient has evidence of periarticular osteophytes and joint space narrowing by imaging studies. Risks, benefits and expectations were discussed with the patient. Patient understand the risks, benefits and expectations and wishes to proceed with surgery.   D/C Plans:  Home with HHPT  Post-op Meds:  No Rx given   Tranexamic Acid:  Not to be given - questionable cardiac disease possible prev MI  Decadron:   To be given   Past Medical History  Diagnosis Date  . Anxiety   . Depression   . Glaucoma   . Hypertension   . GERD (gastroesophageal reflux disease)   . IBS (irritable bowel syndrome)   . Incontinence of urine   . Fibromyalgia   . Hyperlipidemia   . Shortness of breath     WITH EXERTION  . Abnormal EKG     PREOP EKG -NSR, CANNOT RULE OUT INFERIOR INFARCT AGE -INDETERMIINATE--FOLLOWED UP BY PT'S MEDICAL DOCTOR WITH ECHOCARDIOGRAM AND PT CLEARED FOR LEFT TOTAL KNEE ARTHROPLASTY SURGERY.  Marland Kitchen PONV (postoperative nausea and vomiting)   . Umbilical hernia     NO PAIN  . Arthritis     Past Surgical History  Procedure Date  . Transvaginal tape 2000  . Appendectomy 1955  . Abdominal hysterectomy 1990   . Laparoscopy to remove scar tissue 1991  . Joint replacement     LEFT TOTAL HIP REPLACEMENT 1998,  RIGHT TOTAL KNEE REPLACEMENT OCT 2012  . Eye surgery     LASER EYE SURGERY -LEFT FOR TX GLAUCOMA  . Cholecystectomy 2007  . Rt shoulder arthroscopy  2011   . Left knee arthroscopy 2012     No prescriptions prior to admission   Allergies  Allergen Reactions  . Erythromycin Other (See Comments)    Upset stomach  . Sulfa Antibiotics Other (See Comments)    Turns eyes red    History  Substance Use Topics  . Smoking status: Former Smoker -- 5 years    Types: Cigarettes  . Smokeless tobacco: Not on file   Comment: QUIT SMOKING 18 YRS AGO  1995  . Alcohol Use: No    No family history on file.   Review of Systems  Constitutional: Negative.   HENT: Negative.   Eyes: Negative.   Respiratory: Negative.   Cardiovascular: Negative.   Gastrointestinal: Positive for heartburn and constipation.  Genitourinary: Negative.   Musculoskeletal: Positive for myalgias and joint pain.  Skin: Negative.   Neurological: Negative.   Endo/Heme/Allergies: Positive for environmental allergies.  Psychiatric/Behavioral: Negative.     Objective:  Physical Exam  Constitutional: She is oriented to person, place, and time. She appears well-developed and well-nourished.  HENT:  Head: Normocephalic and atraumatic.  Nose: Nose normal.  Mouth/Throat: Oropharynx is  clear and moist.  Eyes: Pupils are equal, round, and reactive to light.  Neck: Neck supple. No JVD present. No tracheal deviation present. No thyromegaly present.  Cardiovascular: Normal rate, regular rhythm and intact distal pulses.   Respiratory: Effort normal and breath sounds normal. No respiratory distress. She has no wheezes.  GI: Soft. There is no tenderness. There is no guarding.  Musculoskeletal:       Left knee: She exhibits decreased range of motion, swelling and bony tenderness. She exhibits no effusion, no ecchymosis and no  deformity. tenderness found.  Lymphadenopathy:    She has no cervical adenopathy.  Neurological: She is alert and oriented to person, place, and time.  Skin: Skin is warm and dry.    Vital signs in last 24 hours: Temp:  [97 F (36.1 C)] 97 F (36.1 C) (10/22 1515) Pulse Rate:  [85] 85  (10/22 1515) Resp:  [20] 20  (10/22 1515) BP: (139)/(85) 139/85 mmHg (10/22 1515) SpO2:  [99 %] 99 % (10/22 1515) Weight:  [83.008 kg (183 lb)] 83.008 kg (183 lb) (10/22 1515)    Imaging Review Plain radiographs demonstrate moderate degenerative joint disease of the left knee(s). The overall alignment isneutral. The bone quality appears to be good for age and reported activity level.  Assessment/Plan:  End stage arthritis, left knee   The patient history, physical examination, clinical judgment of the provider and imaging studies are consistent with end stage degenerative joint disease of the left knee(s) and total knee arthroplasty is deemed medically necessary. The treatment options including medical management, injection therapy arthroscopy and arthroplasty were discussed at length. The risks and benefits of total knee arthroplasty were presented and reviewed. The risks due to aseptic loosening, infection, stiffness, patella tracking problems, thromboembolic complications and other imponderables were discussed. The patient acknowledged the explanation, agreed to proceed with the plan and consent was signed. Patient is being admitted for inpatient treatment for surgery, pain control, PT, OT, prophylactic antibiotics, VTE prophylaxis, progressive ambulation and ADL's and discharge planning. The patient is planning to be discharged home with home health services.    Anastasio Auerbach Liesl Simons   PAC  02/18/2012, 7:32 PM

## 2012-02-18 NOTE — Patient Instructions (Signed)
YOUR SURGERY IS SCHEDULED AT [facility]  ON:   Thursday  10/24  AT 9:30 AM  REPORT TO [facility] AT:  7:00 AM      PHONE # FOR SHORT STAY IS [phone]  DO NOT EAT OR DRINK ANYTHING AFTER MIDNIGHT THE NIGHT BEFORE YOUR SURGERY.  YOU MAY BRUSH YOUR TEETH, RINSE OUT YOUR MOUTH--BUT NO WATER, NO FOOD, NO CHEWING GUM, NO MINTS, NO CANDIES, NO CHEWING TOBACCO.  PLEASE TAKE THE FOLLOWING MEDICATIONS THE AM OF YOUR SURGERY WITH A FEW SIPS OF WATER:  WELLBUTRIN, CLONAZEPAM, NEURONTIN, PRILOSEC, ZOLOFT.   IF YOU USE INHALERS--USE YOUR INHALERS THE AM OF YOUR SURGERY AND BRING INHALERS TO THE HOSPITAL -TAKE TO SURGERY.    IF YOU ARE DIABETIC:  DO NOT TAKE ANY DIABETIC MEDICATIONS THE AM OF YOUR SURGERY.  IF YOU TAKE INSULIN IN THE EVENINGS--PLEASE ONLY TAKE 1/2 NORMAL EVENING DOSE THE NIGHT BEFORE YOUR SURGERY.  NO INSULIN THE AM OF YOUR SURGERY.  IF YOU HAVE SLEEP APNEA AND USE CPAP OR BIPAP--PLEASE BRING THE MASK AND THE TUBING.  DO NOT BRING YOUR MACHINE.  DO NOT BRING VALUABLES, MONEY, CREDIT CARDS.  DO NOT WEAR JEWELRY, MAKE-UP, NAIL POLISH AND NO METAL PINS OR CLIPS IN YOUR HAIR. CONTACT LENS, DENTURES / PARTIALS, GLASSES SHOULD NOT BE WORN TO SURGERY AND IN MOST CASES-HEARING AIDS WILL NEED TO BE REMOVED.  BRING YOUR GLASSES CASE, ANY EQUIPMENT NEEDED FOR YOUR CONTACT LENS. FOR PATIENTS ADMITTED TO THE HOSPITAL--CHECK OUT TIME THE DAY OF DISCHARGE IS 11:00 AM.  ALL INPATIENT ROOMS ARE PRIVATE - WITH BATHROOM, TELEPHONE, TELEVISION AND WIFI INTERNET.  IF YOU ARE BEING DISCHARGED THE SAME DAY OF YOUR SURGERY--YOU CAN NOT DRIVE YOURSELF HOME--AND SHOULD NOT GO HOME ALONE BY TAXI OR BUS.  NO DRIVING OR OPERATING MACHINERY FOR 24 HOURS FOLLOWING ANESTHESIA / PAIN MEDICATIONS.  PLEASE MAKE ARRANGEMENTS FOR SOMEONE TO BE WITH YOU AT HOME THE FIRST 24 HOURS AFTER SURGERY. RESPONSIBLE DRIVER'S NAME___________________________  PHONE #   _______________________                                  PLEASE READ OVER ANY  FACT SHEETS THAT YOU WERE GIVEN: MRSA INFORMATION, BLOOD TRANSFUSION INFORMATION, INCENTIVE SPIROMETER INFORMATION.

## 2012-02-20 ENCOUNTER — Encounter (HOSPITAL_COMMUNITY): Payer: Self-pay | Admitting: *Deleted

## 2012-02-20 ENCOUNTER — Inpatient Hospital Stay (HOSPITAL_COMMUNITY)
Admission: RE | Admit: 2012-02-20 | Discharge: 2012-02-22 | DRG: 470 | Disposition: A | Discharge status: Home Health | Payer: PRIVATE HEALTH INSURANCE | Source: Ambulatory Visit | Attending: Orthopedic Surgery | Admitting: Orthopedic Surgery

## 2012-02-20 ENCOUNTER — Encounter (HOSPITAL_COMMUNITY)
Admission: RE | Disposition: A | Discharge status: Home Health | Payer: Self-pay | Source: Ambulatory Visit | Attending: Orthopedic Surgery

## 2012-02-20 ENCOUNTER — Encounter (HOSPITAL_COMMUNITY): Payer: Self-pay | Admitting: Anesthesiology

## 2012-02-20 ENCOUNTER — Inpatient Hospital Stay (HOSPITAL_COMMUNITY): Payer: PRIVATE HEALTH INSURANCE | Admitting: Anesthesiology

## 2012-02-20 DIAGNOSIS — D62 Acute posthemorrhagic anemia: Secondary | ICD-10-CM | POA: Diagnosis not present

## 2012-02-20 DIAGNOSIS — F3289 Other specified depressive episodes: Secondary | ICD-10-CM | POA: Diagnosis present

## 2012-02-20 DIAGNOSIS — F329 Major depressive disorder, single episode, unspecified: Secondary | ICD-10-CM | POA: Diagnosis present

## 2012-02-20 DIAGNOSIS — IMO0001 Reserved for inherently not codable concepts without codable children: Secondary | ICD-10-CM | POA: Diagnosis present

## 2012-02-20 DIAGNOSIS — E669 Obesity, unspecified: Secondary | ICD-10-CM | POA: Diagnosis present

## 2012-02-20 DIAGNOSIS — M1710 Unilateral primary osteoarthritis, unspecified knee: Principal | ICD-10-CM | POA: Diagnosis present

## 2012-02-20 DIAGNOSIS — E871 Hypo-osmolality and hyponatremia: Secondary | ICD-10-CM | POA: Diagnosis not present

## 2012-02-20 DIAGNOSIS — D500 Iron deficiency anemia secondary to blood loss (chronic): Secondary | ICD-10-CM

## 2012-02-20 DIAGNOSIS — E785 Hyperlipidemia, unspecified: Secondary | ICD-10-CM | POA: Diagnosis present

## 2012-02-20 DIAGNOSIS — Z96659 Presence of unspecified artificial knee joint: Secondary | ICD-10-CM

## 2012-02-20 DIAGNOSIS — H409 Unspecified glaucoma: Secondary | ICD-10-CM | POA: Diagnosis present

## 2012-02-20 DIAGNOSIS — F411 Generalized anxiety disorder: Secondary | ICD-10-CM | POA: Diagnosis present

## 2012-02-20 DIAGNOSIS — K219 Gastro-esophageal reflux disease without esophagitis: Secondary | ICD-10-CM | POA: Diagnosis present

## 2012-02-20 HISTORY — PX: TOTAL KNEE ARTHROPLASTY: SHX125

## 2012-02-20 SURGERY — ARTHROPLASTY, KNEE, TOTAL
Anesthesia: Spinal | Site: Knee | Laterality: Left | Wound class: Clean

## 2012-02-20 MED ORDER — FAMOTIDINE 40 MG PO TABS
40.0000 mg | ORAL_TABLET | Freq: Every day | ORAL | Status: DC
  Administered 2012-02-20 – 2012-02-21 (×2): 40 mg via ORAL
  Filled 2012-02-20 (×3): qty 1

## 2012-02-20 MED ORDER — HYDROCODONE-ACETAMINOPHEN 7.5-325 MG PO TABS
1.0000 | ORAL_TABLET | ORAL | Status: DC
  Administered 2012-02-20: 1 via ORAL
  Administered 2012-02-20 – 2012-02-22 (×9): 2 via ORAL
  Filled 2012-02-20 (×13): qty 2

## 2012-02-20 MED ORDER — FERROUS SULFATE 325 (65 FE) MG PO TABS
325.0000 mg | ORAL_TABLET | Freq: Three times a day (TID) | ORAL | Status: DC
  Administered 2012-02-20 – 2012-02-22 (×6): 325 mg via ORAL
  Filled 2012-02-20 (×9): qty 1

## 2012-02-20 MED ORDER — MENTHOL 3 MG MT LOZG: 1.0000 | LOZENGE | OROMUCOSAL | Status: DC | PRN

## 2012-02-20 MED ORDER — KETOROLAC TROMETHAMINE 30 MG/ML IJ SOLN
INTRAMUSCULAR | Status: DC | PRN
  Administered 2012-02-20: 30 mg

## 2012-02-20 MED ORDER — CLONAZEPAM 0.5 MG PO TABS
0.5000 mg | ORAL_TABLET | Freq: Two times a day (BID) | ORAL | Status: DC
  Administered 2012-02-20 – 2012-02-21 (×3): 0.5 mg via ORAL
  Filled 2012-02-20 (×3): qty 1

## 2012-02-20 MED ORDER — LACTATED RINGERS IV SOLN: INTRAVENOUS | Status: DC

## 2012-02-20 MED ORDER — CEFAZOLIN SODIUM-DEXTROSE 2-3 GM-% IV SOLR
2.0000 g | INTRAVENOUS | Status: AC
  Administered 2012-02-20: 2 g via INTRAVENOUS

## 2012-02-20 MED ORDER — RIVAROXABAN 10 MG PO TABS
10.0000 mg | ORAL_TABLET | ORAL | Status: DC
  Administered 2012-02-21 – 2012-02-22 (×2): 10 mg via ORAL
  Filled 2012-02-20 (×3): qty 1

## 2012-02-20 MED ORDER — DEXAMETHASONE SODIUM PHOSPHATE 10 MG/ML IJ SOLN
10.0000 mg | Freq: Once | INTRAMUSCULAR | Status: AC
  Administered 2012-02-21: 10 mg via INTRAVENOUS
  Filled 2012-02-20: qty 1

## 2012-02-20 MED ORDER — OXYMETAZOLINE HCL 0.05 % NA SOLN: 2.0000 | Freq: Two times a day (BID) | NASAL | Status: DC | PRN

## 2012-02-20 MED ORDER — KETOROLAC TROMETHAMINE 15 MG/ML IJ SOLN
INTRAMUSCULAR | Status: AC
  Filled 2012-02-20: qty 1

## 2012-02-20 MED ORDER — PROPOFOL 10 MG/ML IV BOLUS
INTRAVENOUS | Status: DC | PRN
  Administered 2012-02-20: 20 mg via INTRAVENOUS
  Administered 2012-02-20: 10 mg via INTRAVENOUS

## 2012-02-20 MED ORDER — MIDAZOLAM HCL 5 MG/5ML IJ SOLN
INTRAMUSCULAR | Status: DC | PRN
  Administered 2012-02-20: 2 mg via INTRAVENOUS

## 2012-02-20 MED ORDER — SIMVASTATIN 20 MG PO TABS
20.0000 mg | ORAL_TABLET | Freq: Every day | ORAL | Status: DC
  Administered 2012-02-20 – 2012-02-21 (×2): 20 mg via ORAL
  Filled 2012-02-20 (×3): qty 1

## 2012-02-20 MED ORDER — BUPIVACAINE HCL (PF) 0.75 % IJ SOLN
INTRAMUSCULAR | Status: DC | PRN
  Administered 2012-02-20: 13 mg

## 2012-02-20 MED ORDER — FENTANYL CITRATE 0.05 MG/ML IJ SOLN
INTRAMUSCULAR | Status: DC | PRN
  Administered 2012-02-20 (×2): 50 ug via INTRAVENOUS

## 2012-02-20 MED ORDER — DEXAMETHASONE SODIUM PHOSPHATE 10 MG/ML IJ SOLN: 10.0000 mg | Freq: Once | INTRAMUSCULAR | Status: DC

## 2012-02-20 MED ORDER — SERTRALINE HCL 100 MG PO TABS
200.0000 mg | ORAL_TABLET | Freq: Every morning | ORAL | Status: DC
  Filled 2012-02-20 (×2): qty 2

## 2012-02-20 MED ORDER — DIPHENHYDRAMINE HCL 25 MG PO CAPS: 25.0000 mg | ORAL_CAPSULE | Freq: Four times a day (QID) | ORAL | Status: DC | PRN

## 2012-02-20 MED ORDER — PROPOFOL INFUSION 10 MG/ML OPTIME
INTRAVENOUS | Status: DC | PRN
  Administered 2012-02-20: 75 ug/kg/min via INTRAVENOUS

## 2012-02-20 MED ORDER — PANTOPRAZOLE SODIUM 40 MG PO TBEC
40.0000 mg | DELAYED_RELEASE_TABLET | Freq: Every day | ORAL | Status: DC
  Administered 2012-02-21 – 2012-02-22 (×2): 40 mg via ORAL
  Filled 2012-02-20 (×3): qty 1

## 2012-02-20 MED ORDER — CELECOXIB 200 MG PO CAPS
200.0000 mg | ORAL_CAPSULE | Freq: Two times a day (BID) | ORAL | Status: DC
  Administered 2012-02-20 – 2012-02-22 (×5): 200 mg via ORAL
  Filled 2012-02-20 (×6): qty 1

## 2012-02-20 MED ORDER — ZOLPIDEM TARTRATE 5 MG PO TABS: 5.0000 mg | ORAL_TABLET | Freq: Every evening | ORAL | Status: DC | PRN

## 2012-02-20 MED ORDER — POLYETHYLENE GLYCOL 3350 17 G PO PACK
17.0000 g | PACK | Freq: Two times a day (BID) | ORAL | Status: DC
  Administered 2012-02-20 – 2012-02-21 (×4): 17 g via ORAL

## 2012-02-20 MED ORDER — CLONAZEPAM 0.5 MG PO TABS: 0.5000 mg | ORAL_TABLET | Freq: Three times a day (TID) | ORAL | Status: DC

## 2012-02-20 MED ORDER — HYDROCORTISONE 2.5 % RE CREA
TOPICAL_CREAM | Freq: Two times a day (BID) | RECTAL | Status: DC
  Administered 2012-02-22: 10:00:00 via RECTAL
  Filled 2012-02-20: qty 28.35

## 2012-02-20 MED ORDER — CLONAZEPAM 1 MG PO TABS
1.0000 mg | ORAL_TABLET | Freq: Every day | ORAL | Status: DC
  Administered 2012-02-20 – 2012-02-21 (×2): 1 mg via ORAL
  Filled 2012-02-20 (×2): qty 1

## 2012-02-20 MED ORDER — ACETAMINOPHEN 10 MG/ML IV SOLN
INTRAVENOUS | Status: DC | PRN
  Administered 2012-02-20: 1000 mg via INTRAVENOUS

## 2012-02-20 MED ORDER — CHLORHEXIDINE GLUCONATE 4 % EX LIQD: 60.0000 mL | Freq: Once | CUTANEOUS | Status: DC

## 2012-02-20 MED ORDER — SODIUM CHLORIDE 0.9 % IR SOLN
Status: DC | PRN
  Administered 2012-02-20: 1000 mL

## 2012-02-20 MED ORDER — HYDROMORPHONE HCL PF 1 MG/ML IJ SOLN
0.5000 mg | INTRAMUSCULAR | Status: DC | PRN
  Administered 2012-02-20: 1 mg via INTRAVENOUS
  Administered 2012-02-20: 2 mg via INTRAVENOUS
  Filled 2012-02-20: qty 2
  Filled 2012-02-20: qty 1

## 2012-02-20 MED ORDER — ALUM & MAG HYDROXIDE-SIMETH 200-200-20 MG/5ML PO SUSP: 30.0000 mL | ORAL | Status: DC | PRN

## 2012-02-20 MED ORDER — DOCUSATE SODIUM 100 MG PO CAPS
100.0000 mg | ORAL_CAPSULE | Freq: Two times a day (BID) | ORAL | Status: DC
  Administered 2012-02-20 – 2012-02-22 (×5): 100 mg via ORAL

## 2012-02-20 MED ORDER — ONDANSETRON HCL 4 MG/2ML IJ SOLN
4.0000 mg | Freq: Four times a day (QID) | INTRAMUSCULAR | Status: DC | PRN
  Administered 2012-02-20 – 2012-02-21 (×2): 4 mg via INTRAVENOUS
  Filled 2012-02-20 (×2): qty 2

## 2012-02-20 MED ORDER — DEXAMETHASONE SODIUM PHOSPHATE 10 MG/ML IJ SOLN
INTRAMUSCULAR | Status: DC | PRN
  Administered 2012-02-20: 10 mg via INTRAVENOUS

## 2012-02-20 MED ORDER — BUPROPION HCL ER (SR) 150 MG PO TB12
150.0000 mg | ORAL_TABLET | Freq: Two times a day (BID) | ORAL | Status: DC
  Administered 2012-02-20 – 2012-02-22 (×4): 150 mg via ORAL
  Filled 2012-02-20 (×6): qty 1

## 2012-02-20 MED ORDER — FLEET ENEMA 7-19 GM/118ML RE ENEM: 1.0000 | ENEMA | Freq: Once | RECTAL | Status: AC | PRN

## 2012-02-20 MED ORDER — HYDROMORPHONE HCL PF 1 MG/ML IJ SOLN: 0.2500 mg | INTRAMUSCULAR | Status: DC | PRN

## 2012-02-20 MED ORDER — BUPIVACAINE-EPINEPHRINE PF 0.25-1:200000 % IJ SOLN
INTRAMUSCULAR | Status: DC | PRN
  Administered 2012-02-20: 50 mL

## 2012-02-20 MED ORDER — GABAPENTIN 600 MG PO TABS
900.0000 mg | ORAL_TABLET | Freq: Every day | ORAL | Status: DC
  Administered 2012-02-20: 900 mg via ORAL
  Filled 2012-02-20 (×2): qty 1.5

## 2012-02-20 MED ORDER — PHENOL 1.4 % MT LIQD: 1.0000 | OROMUCOSAL | Status: DC | PRN

## 2012-02-20 MED ORDER — MEPERIDINE HCL 50 MG/ML IJ SOLN: 6.2500 mg | INTRAMUSCULAR | Status: DC | PRN

## 2012-02-20 MED ORDER — LACTATED RINGERS IV SOLN
INTRAVENOUS | Status: DC
  Administered 2012-02-20: 1000 mL via INTRAVENOUS
  Administered 2012-02-20: 10:00:00 via INTRAVENOUS

## 2012-02-20 MED ORDER — SODIUM CHLORIDE 0.9 % IV SOLN
INTRAVENOUS | Status: AC
  Administered 2012-02-20 – 2012-02-21 (×2): via INTRAVENOUS
  Filled 2012-02-20 (×4): qty 1000

## 2012-02-20 MED ORDER — PROMETHAZINE HCL 25 MG/ML IJ SOLN: 6.2500 mg | INTRAMUSCULAR | Status: DC | PRN

## 2012-02-20 MED ORDER — CEFAZOLIN SODIUM-DEXTROSE 2-3 GM-% IV SOLR
2.0000 g | Freq: Four times a day (QID) | INTRAVENOUS | Status: AC
  Administered 2012-02-20: 2 g via INTRAVENOUS
  Filled 2012-02-20 (×2): qty 50

## 2012-02-20 MED ORDER — BISACODYL 10 MG RE SUPP: 10.0000 mg | Freq: Every day | RECTAL | Status: DC | PRN

## 2012-02-20 MED ORDER — 0.9 % SODIUM CHLORIDE (POUR BTL) OPTIME
TOPICAL | Status: DC | PRN
  Administered 2012-02-20: 1000 mL

## 2012-02-20 MED ORDER — ONDANSETRON HCL 4 MG PO TABS: 4.0000 mg | ORAL_TABLET | Freq: Four times a day (QID) | ORAL | Status: DC | PRN

## 2012-02-20 MED ORDER — RISPERIDONE 0.25 MG PO TABS
0.2500 mg | ORAL_TABLET | Freq: Every day | ORAL | Status: DC
  Administered 2012-02-20 – 2012-02-21 (×2): 0.25 mg via ORAL
  Filled 2012-02-20 (×3): qty 1

## 2012-02-20 SURGICAL SUPPLY — 59 items
ADH SKN CLS APL DERMABOND .7 (GAUZE/BANDAGES/DRESSINGS) ×1
BAG SPEC THK2 15X12 ZIP CLS (MISCELLANEOUS) ×1
BAG ZIPLOCK 12X15 (MISCELLANEOUS) ×2 IMPLANT
BANDAGE ELASTIC 6 VELCRO ST LF (GAUZE/BANDAGES/DRESSINGS) ×2 IMPLANT
BANDAGE ESMARK 6X9 LF (GAUZE/BANDAGES/DRESSINGS) ×1 IMPLANT
BLADE SAW SGTL 13.0X1.19X90.0M (BLADE) ×2 IMPLANT
BNDG CMPR 9X6 STRL LF SNTH (GAUZE/BANDAGES/DRESSINGS) ×1
BNDG ESMARK 6X9 LF (GAUZE/BANDAGES/DRESSINGS) ×2
BOWL SMART MIX CTS (DISPOSABLE) ×2 IMPLANT
CEMENT HV SMART SET (Cement) ×2 IMPLANT
CLOTH BEACON ORANGE TIMEOUT ST (SAFETY) ×2 IMPLANT
CUFF TOURN SGL QUICK 34 (TOURNIQUET CUFF) ×2
CUFF TRNQT CYL 34X4X40X1 (TOURNIQUET CUFF) ×1 IMPLANT
DECANTER SPIKE VIAL GLASS SM (MISCELLANEOUS) ×2 IMPLANT
DERMABOND ADVANCED (GAUZE/BANDAGES/DRESSINGS) ×1
DERMABOND ADVANCED .7 DNX12 (GAUZE/BANDAGES/DRESSINGS) ×1 IMPLANT
DRAPE EXTREMITY T 121X128X90 (DRAPE) ×2 IMPLANT
DRAPE POUCH INSTRU U-SHP 10X18 (DRAPES) ×2 IMPLANT
DRAPE U-SHAPE 47X51 STRL (DRAPES) ×2 IMPLANT
DRSG AQUACEL AG ADV 3.5X 6 (GAUZE/BANDAGES/DRESSINGS) ×1 IMPLANT
DRSG AQUACEL AG ADV 3.5X10 (GAUZE/BANDAGES/DRESSINGS) ×2 IMPLANT
DRSG TEGADERM 4X4.75 (GAUZE/BANDAGES/DRESSINGS) ×2 IMPLANT
DURAPREP 26ML APPLICATOR (WOUND CARE) ×2 IMPLANT
ELECT REM PT RETURN 9FT ADLT (ELECTROSURGICAL) ×2
ELECTRODE REM PT RTRN 9FT ADLT (ELECTROSURGICAL) ×1 IMPLANT
EVACUATOR 1/8 PVC DRAIN (DRAIN) ×2 IMPLANT
FACESHIELD LNG OPTICON STERILE (SAFETY) ×10 IMPLANT
GAUZE SPONGE 2X2 8PLY STRL LF (GAUZE/BANDAGES/DRESSINGS) ×1 IMPLANT
GLOVE BIOGEL PI IND STRL 7.5 (GLOVE) ×1 IMPLANT
GLOVE BIOGEL PI IND STRL 8 (GLOVE) ×1 IMPLANT
GLOVE BIOGEL PI INDICATOR 7.5 (GLOVE) ×1
GLOVE BIOGEL PI INDICATOR 8 (GLOVE) ×1
GLOVE ECLIPSE 8.0 STRL XLNG CF (GLOVE) ×2 IMPLANT
GLOVE ORTHO TXT STRL SZ7.5 (GLOVE) ×4 IMPLANT
GOWN BRE IMP PREV XXLGXLNG (GOWN DISPOSABLE) ×4 IMPLANT
GOWN STRL NON-REIN LRG LVL3 (GOWN DISPOSABLE) ×2 IMPLANT
HANDPIECE INTERPULSE COAX TIP (DISPOSABLE) ×2
IMMOBILIZER KNEE 20 (SOFTGOODS) ×2
IMMOBILIZER KNEE 20 THIGH 36 (SOFTGOODS) IMPLANT
KIT BASIN OR (CUSTOM PROCEDURE TRAY) ×2 IMPLANT
MANIFOLD NEPTUNE II (INSTRUMENTS) ×2 IMPLANT
NDL SAFETY ECLIPSE 18X1.5 (NEEDLE) ×1 IMPLANT
NEEDLE HYPO 18GX1.5 SHARP (NEEDLE) ×2
NS IRRIG 1000ML POUR BTL (IV SOLUTION) ×4 IMPLANT
PACK TOTAL JOINT (CUSTOM PROCEDURE TRAY) ×2 IMPLANT
POSITIONER SURGICAL ARM (MISCELLANEOUS) ×2 IMPLANT
SET HNDPC FAN SPRY TIP SCT (DISPOSABLE) ×1 IMPLANT
SET PAD KNEE POSITIONER (MISCELLANEOUS) ×2 IMPLANT
SPONGE GAUZE 2X2 STER 10/PKG (GAUZE/BANDAGES/DRESSINGS) ×1
SUCTION FRAZIER 12FR DISP (SUCTIONS) ×2 IMPLANT
SUT MNCRL AB 4-0 PS2 18 (SUTURE) ×2 IMPLANT
SUT VIC AB 1 CT1 36 (SUTURE) ×6 IMPLANT
SUT VIC AB 2-0 CT1 27 (SUTURE) ×6
SUT VIC AB 2-0 CT1 TAPERPNT 27 (SUTURE) ×3 IMPLANT
SYR 50ML LL SCALE MARK (SYRINGE) ×2 IMPLANT
TOWEL OR 17X26 10 PK STRL BLUE (TOWEL DISPOSABLE) ×4 IMPLANT
TRAY FOLEY CATH 14FRSI W/METER (CATHETERS) ×2 IMPLANT
WATER STERILE IRR 1500ML POUR (IV SOLUTION) ×2 IMPLANT
WRAP KNEE MAXI GEL POST OP (GAUZE/BANDAGES/DRESSINGS) ×2 IMPLANT

## 2012-02-20 NOTE — Transfer of Care (Signed)
Immediate Anesthesia Transfer of Care Note  Patient: [name]  Procedure(s) Performed: Procedure(s) (LRB): TOTAL KNEE ARTHROPLASTY (Left)  Patient Location: PACU  Anesthesia Type: Regional  Level of Consciousness: sedated, patient cooperative and responds to stimulaton  Airway & Oxygen Therapy: Patient Spontanous Breathing and Patient connected to face mask oxgen  Post-op Assessment: Report given to PACU RN and Post -op Vital signs reviewed and stable  Post vital signs: Reviewed and stable  Complications: No apparent anesthesia complications L2 level on release to staff, no pain noted slight movement to lower ext.

## 2012-02-20 NOTE — Op Note (Signed)
NAME:  BETHANNIE IGLEHART                      MEDICAL RECORD NO.:  [id number]                             FACILITY:  [facility]      PHYSICIAN:  Madlyn Frankel. Charlann Boxer, M.D.  DATE OF BIRTH:  Jul 29, 1950      DATE OF PROCEDURE:  02/20/2012                                     OPERATIVE REPORT         PREOPERATIVE DIAGNOSIS:  Left knee osteoarthritis.      POSTOPERATIVE DIAGNOSIS:  Left knee osteoarthritis.      FINDINGS:  The patient was noted to have complete loss of cartilage and   bone-on-bone arthritis with associated osteophytes in the medial and patellofemoral compartments of   the knee with a significant synovitis and associated effusion.      PROCEDURE:  Left total knee replacement.      COMPONENTS USED:  DePuy rotating platform posterior stabilized knee   system, a size 3 femur, 2.5 tibia, 12.5 mm PS insert, and 35 patellar   button.      SURGEON:  Madlyn Frankel. Charlann Boxer, M.D.      ASSISTANT:  Lanney Gins, PA-C.      ANESTHESIA:  Spinal.      SPECIMENS:  None.      COMPLICATION:  None.      DRAINS:  One Hemovac.  EBL: <150cc      TOURNIQUET TIME:   Total Tourniquet Time Documented: Thigh (Left) - 26 minutes .      The patient was stable to the recovery room.      INDICATION FOR PROCEDURE:  ARMELIA PENTON is a 61 y.o. female patient of   mine.  The patient had been seen, evaluated, and treated conservatively in the   office with medication, activity modification, and injections.  The patient had   radiographic changes of bone-on-bone arthritis with endplate sclerosis and osteophytes noted.      The patient failed conservative measures including medication, injections, and activity modification, and at this point was ready for more definitive measures.   Based on the radiographic changes and failed conservative measures, the patient   decided to proceed with total knee replacement.  Risks of infection,   DVT, component failure, need for revision surgery, postop course,  and   expectations were all   discussed and reviewed.  Consent was obtained for benefit of pain   relief.      PROCEDURE IN DETAIL:  The patient was brought to the operative theater.   Once adequate anesthesia, preoperative antibiotics, 2 gm of Ancef administered, the patient was positioned supine with the left thigh tourniquet placed.  The  left lower extremity was prepped and draped in sterile fashion.  A time-   out was performed identifying the patient, planned procedure, and   extremity.      The left lower extremity was placed in the [facility] leg holder.  The leg was   exsanguinated, tourniquet elevated to 250 mmHg.  A midline incision was   made followed by median parapatellar arthrotomy.  Following initial   exposure, attention was first directed to the patella.  Precut  measurement was noted to be 24 mm.  I resected down to 14 mm and used a   35 patellar button to restore patellar height as well as cover the cut   surface.      The lug holes were drilled and a metal shim was placed to protect the   patella from retractors and saw blades.      At this point, attention was now directed to the femur.  The femoral   canal was opened with a drill, irrigated to try to prevent fat emboli.  An   intramedullary rod was passed at 3 degrees valgus, 10 mm of bone was   resected off the distal femur.  Following this resection, the tibia was   subluxated anteriorly.  Using the extramedullary guide, 10 mm of bone was resected off   the proximal lateral tibia.  We confirmed the gap would be   stable medially and laterally with a 10 mm insert as well as confirmed   the cut was perpendicular in the coronal plane, checking with an alignment rod.      Once this was done, I sized the femur to be a size 3 in the anterior-   posterior dimension, chose a standard component based on medial and   lateral dimension.  The size 3 rotation block was then pinned in   position anterior referenced using the  C-clamp to set rotation.  The   anterior, posterior, and  chamfer cuts were made without difficulty nor   notching making certain that I was along the anterior cortex to help   with flexion gap stability.      The final box cut was made off the lateral aspect of distal femur.      At this point, the tibia was sized to be a size 2.5, the size 2.5 tray was   then pinned in position through the medial third of the tubercle,   drilled, and keel punched.  Trial reduction was now carried with a 3 femur,  2.5 tibia, a 12.5 mm insert, and the 35 patella botton.  The knee was brought to   extension, full extension with good flexion stability with the patella   tracking through the trochlea without application of pressure.  Given   all these findings, the trial components removed.  Final components were   opened and cement was mixed.  The knee was irrigated with normal saline   solution and pulse lavage.  The synovial lining was   then injected with 0.25% Marcaine with epinephrine and 1 cc of Toradol,   total of 61 cc.      The knee was irrigated.  Final implants were then cemented onto clean and   dried cut surfaces of bone with the knee brought to extension with a 12.5 mm trial insert.      Once the cement had fully cured, the excess cement was removed   throughout the knee.  I confirmed I was satisfied with the range of   motion and stability, and the final 12.5 mm PS insert was chosen.  It was   placed into the knee.      The tourniquet had been let down at 25 minutes.  No significant   hemostasis required.  The medium Hemovac drain was placed deep.  The   extensor mechanism was then reapproximated using #1 Vicryl with the knee   in flexion.  The   remaining wound was closed with 2-0 Vicryl and  running 4-0 Monocryl.   The knee was cleaned, dried, dressed sterilely using Dermabond and   Aquacel dressing.  Drain site dressed separately.  The patient was then   brought to recovery room in  stable condition, tolerating the procedure   well.   Please note that Physician Assistant, Lanney Gins, was present for the entirety of the case, and was utilized for pre-operative positioning, peri-operative retractor management, general facilitation of the procedure.  He was also utilized for primary wound closure at the end of the case.              Madlyn Frankel Charlann Boxer, M.D.

## 2012-02-20 NOTE — Interval H&P Note (Signed)
History and Physical Interval Note:  02/20/2012 9:57 AM  [name]  has presented today for surgery, with the diagnosis of left knee osteoarthritis  The various methods of treatment have been discussed with the patient and family. After consideration of risks, benefits and other options for treatment, the patient has consented to  Procedure(s) (LRB) with comments: TOTAL KNEE ARTHROPLASTY (Left) as a surgical intervention .  The patient's history has been reviewed, patient examined, no change in status, stable for surgery.  I have reviewed the patient's chart and labs.  Questions were answered to the patient's satisfaction.     Shelda Pal

## 2012-02-20 NOTE — Anesthesia Procedure Notes (Addendum)
Spinal  Patient location during procedure: OR Start time: 02/20/2012 10:27 AM End time: 02/20/2012 10:32 AM Staffing CRNA/Resident: Paris Lore Performed by: resident/CRNA  Preanesthetic Checklist Completed: patient identified, site marked, surgical consent, pre-op evaluation, timeout performed, IV checked, risks and benefits discussed and monitors and equipment checked Spinal Block Patient position: sitting Prep: Betadine Patient monitoring: heart rate, continuous pulse ox and blood pressure Approach: midline Location: L2-3 Injection technique: single-shot Needle Needle type: Sprotte  Needle gauge: 25 G Needle length: 9 cm Assessment Sensory level: T4 Additional Notes Expiration date of kit checked and confirmed. Patient tolerated procedure well, without complications.

## 2012-02-20 NOTE — Plan of Care (Signed)
Problem: Consults Goal: Diagnosis- Total Joint Replacement Outcome: Completed/Met Date Met:  02/20/12 Primary Total Knee Left

## 2012-02-20 NOTE — Anesthesia Postprocedure Evaluation (Signed)
  Anesthesia Post-op Note  Patient: [name]  Procedure(s) Performed: Procedure(s) (LRB): TOTAL KNEE ARTHROPLASTY (Left)  Patient Location: PACU  Anesthesia Type: Spinal  Level of Consciousness: awake and alert   Airway and Oxygen Therapy: Patient Spontanous Breathing  Post-op Pain: mild  Post-op Assessment: Post-op Vital signs reviewed, Patient's Cardiovascular Status Stable, Respiratory Function Stable, Patent Airway and No signs of Nausea or vomiting  Post-op Vital Signs: stable  Complications: No apparent anesthesia complications

## 2012-02-20 NOTE — Anesthesia Preprocedure Evaluation (Signed)
Anesthesia Evaluation  Patient identified by MRN, date of birth, ID band Patient awake    Reviewed: Allergy & Precautions, H&P , NPO status , Patient's Chart, lab work & pertinent test results  History of Anesthesia Complications (+) PONV  Airway Mallampati: II TM Distance: >3 FB Neck ROM: Full    Dental No notable dental hx.    Pulmonary neg pulmonary ROS,  breath sounds clear to auscultation  Pulmonary exam normal       Cardiovascular hypertension, Pt. on medications negative cardio ROS  Rhythm:Regular Rate:Normal     Neuro/Psych PSYCHIATRIC DISORDERS Anxiety Depression negative neurological ROS  negative psych ROS   GI/Hepatic negative GI ROS, Neg liver ROS, GERD-  ,  Endo/Other  negative endocrine ROS  Renal/GU negative Renal ROS  negative genitourinary   Musculoskeletal negative musculoskeletal ROS (+) Fibromyalgia -  Abdominal   Peds negative pediatric ROS (+)  Hematology negative hematology ROS (+)   Anesthesia Other Findings Two upper front crowns  Reproductive/Obstetrics negative OB ROS                           Anesthesia Physical Anesthesia Plan  ASA: II  Anesthesia Plan: Spinal   Post-op Pain Management:    Induction:   Airway Management Planned: Simple Face Mask  Additional Equipment:   Intra-op Plan:   Post-operative Plan: Extubation in OR  Informed Consent: I have reviewed the patients History and Physical, chart, labs and discussed the procedure including the risks, benefits and alternatives for the proposed anesthesia with the patient or authorized representative who has indicated his/her understanding and acceptance.   Dental advisory given  Plan Discussed with: CRNA  Anesthesia Plan Comments:         Anesthesia Quick Evaluation

## 2012-02-21 ENCOUNTER — Encounter (HOSPITAL_COMMUNITY): Payer: Self-pay | Admitting: Orthopedic Surgery

## 2012-02-21 DIAGNOSIS — D500 Iron deficiency anemia secondary to blood loss (chronic): Secondary | ICD-10-CM

## 2012-02-21 DIAGNOSIS — E871 Hypo-osmolality and hyponatremia: Secondary | ICD-10-CM

## 2012-02-21 DIAGNOSIS — E669 Obesity, unspecified: Secondary | ICD-10-CM

## 2012-02-21 LAB — CBC
Hemoglobin: 9.2 g/dL — ABNORMAL LOW (ref 12.0–15.0)
MCHC: 34.6 g/dL (ref 30.0–36.0)
Platelets: 203 10*3/uL (ref 150–400)
RBC: 2.99 MIL/uL — ABNORMAL LOW (ref 3.87–5.11)

## 2012-02-21 LAB — MRSA CULTURE

## 2012-02-21 LAB — BASIC METABOLIC PANEL
GFR calc non Af Amer: >90 mL/min (ref >90)
Glucose, Bld: 123 mg/dL — ABNORMAL HIGH (ref 70–99)
Potassium: 4 mEq/L (ref 3.5–5.1)
Sodium: 134 mEq/L — ABNORMAL LOW (ref 135–145)

## 2012-02-21 MED ORDER — FERROUS SULFATE 325 (65 FE) MG PO TABS: 325.0000 mg | ORAL_TABLET | Freq: Three times a day (TID) | ORAL | Status: DC

## 2012-02-21 MED ORDER — SERTRALINE HCL 100 MG PO TABS
200.0000 mg | ORAL_TABLET | Freq: Every day | ORAL | Status: DC
  Administered 2012-02-22: 200 mg via ORAL
  Filled 2012-02-21: qty 2

## 2012-02-21 MED ORDER — GABAPENTIN 300 MG PO CAPS
900.0000 mg | ORAL_CAPSULE | Freq: Every day | ORAL | Status: DC
  Administered 2012-02-21 – 2012-02-22 (×2): 900 mg via ORAL
  Filled 2012-02-21 (×2): qty 3

## 2012-02-21 MED ORDER — HYDROCODONE-ACETAMINOPHEN 7.5-325 MG PO TABS: 1.0000 | ORAL_TABLET | ORAL | Status: DC | PRN

## 2012-02-21 MED ORDER — POLYETHYLENE GLYCOL 3350 17 G PO PACK: 17.0000 g | PACK | Freq: Two times a day (BID) | ORAL | Status: DC

## 2012-02-21 MED ORDER — SERTRALINE HCL 100 MG PO TABS
200.0000 mg | ORAL_TABLET | Freq: Every day | ORAL | Status: DC
  Administered 2012-02-21: 200 mg via ORAL
  Filled 2012-02-21 (×2): qty 2

## 2012-02-21 MED ORDER — DSS 100 MG PO CAPS: 100.0000 mg | ORAL_CAPSULE | Freq: Two times a day (BID) | ORAL | Status: DC

## 2012-02-21 MED ORDER — ASPIRIN EC 325 MG PO TBEC: 325.0000 mg | DELAYED_RELEASE_TABLET | Freq: Two times a day (BID) | ORAL | Status: DC

## 2012-02-21 MED ORDER — DIPHENHYDRAMINE HCL 25 MG PO CAPS: 25.0000 mg | ORAL_CAPSULE | Freq: Four times a day (QID) | ORAL | Status: DC | PRN

## 2012-02-21 NOTE — Progress Notes (Signed)
OT Note:  Pt screened for OT.  She had other knee done and has no OT needs.  [place], OTR/L [phone] 02/21/2012

## 2012-02-21 NOTE — Progress Notes (Signed)
   Subjective: 1 Day Post-Op Procedure(s) (LRB): TOTAL KNEE ARTHROPLASTY (Left)   Patient reports pain as mild, pain well controlled. No events throughout the night.   Objective:   VITALS:   Filed Vitals:   02/21/12  BP: 114/73  Pulse: 78  Temp: 97.5 F (36.4 C)   Resp: 16    Neurovascular intact Dorsiflexion/Plantar flexion intact Incision: dressing C/D/I No cellulitis present Compartment soft  LABS  Basename 02/21/12 0428 02/18/12 1430  HGB 9.2* 12.5  HCT 26.6* 36.1  WBC 10.7* 5.0  PLT 203 247     Basename 02/21/12 0428 02/18/12 1430  NA 134* 135  K 4.0 3.6  BUN 13 15  CREATININE 0.69 0.78  GLUCOSE 123* 98     Assessment/Plan: 1 Day Post-Op Procedure(s) (LRB): TOTAL KNEE ARTHROPLASTY (Left) HV drain d/c'ed Foley cath d/c'ed Advance diet Up with therapy D/C IV fluids Discharge home with home health eventually, possibly Saturday if she does well with PT.  Expected ABLA  Treated with iron and will observe  Obese (BMI 30-39.9)  Estimated Body mass index is 33.47 kg/(m^2) as calculated from the following:   Height as of this encounter: 5\' 2" (1.575 m).   Weight as of this encounter: 183 lb(83.008 kg). Patient also counseled that weight may inhibit the healing process Patient counseled that losing weight will help with future health issues  Hyponatremia Treated with IV fluids and will observe     [name]. [name]   PAC  02/21/2012, 9:04 AM

## 2012-02-21 NOTE — Evaluation (Signed)
Physical Therapy Evaluation Patient Details Name: [name] MRN: [id number] DOB: 1950/11/26 Today's Date: 02/21/2012 Time: 1191-4782 PT Time Calculation (min): 25 min  PT Assessment / Plan / Recommendation Clinical Impression  Pt s/o L TKR presents with decreased L LE strength/ROM limiting functional mobility    PT Assessment  Patient needs continued PT services    Follow Up Recommendations  Home health PT    Does the patient have the potential to tolerate intense rehabilitation      Barriers to Discharge None      Equipment Recommendations  None recommended by PT    Recommendations for Other Services OT consult   Frequency 7X/week    Precautions / Restrictions Precautions Precautions: Knee Restrictions Weight Bearing Restrictions: No Other Position/Activity Restrictions: WBAT         Mobility  Bed Mobility Bed Mobility: Supine to Sit Supine to Sit: 4: Min assist Transfers Transfers: Sit to Stand;Stand to Sit Sit to Stand: 4: Min assist Stand to Sit: 4: Min assist Details for Transfer Assistance: cues for LE management and use of UEs to self assist Ambulation/Gait Ambulation/Gait Assistance: 4: Min assist Ambulation Distance (Feet): 53 Feet Assistive device: Rolling walker Ambulation/Gait Assistance Details: cues for sequence, posture, position from RW, increased knee ext and heel contact on L  Gait Pattern: Step-to pattern    Shoulder Instructions     Exercises Total Joint Exercises Ankle Circles/Pumps: AROM;Both;10 reps;Supine Quad Sets: AROM;10 reps;Both;Supine Heel Slides: AAROM;10 reps;Supine;Left Straight Leg Raises: AROM;AAROM;15 reps;Left;Supine   PT Diagnosis: Difficulty walking  PT Problem List: Decreased strength;Decreased range of motion;Decreased activity tolerance;Decreased mobility;Decreased knowledge of use of DME;Pain PT Treatment Interventions: DME instruction;Gait training;Stair training;Functional mobility  training;Therapeutic activities;Therapeutic exercise;Patient/family education   PT Goals Acute Rehab PT Goals PT Goal Formulation: With patient Time For Goal Achievement: 02/25/12 Potential to Achieve Goals: Good Pt will go Supine/Side to Sit: with supervision PT Goal: Supine/Side to Sit - Progress: Goal set today Pt will go Sit to Supine/Side: with supervision PT Goal: Sit to Supine/Side - Progress: Goal set today Pt will go Sit to Stand: with supervision PT Goal: Sit to Stand - Progress: Goal set today Pt will go Stand to Sit: with supervision PT Goal: Stand to Sit - Progress: Goal set today Pt will Ambulate: 51 - 150 feet;with supervision;with rolling walker PT Goal: Ambulate - Progress: Goal set today Pt will Go Up / Down Stairs: 1-2 stairs;with min assist;with rolling walker PT Goal: Up/Down Stairs - Progress: Goal set today  Visit Information  Last PT Received On: 02/21/12 Assistance Needed: +1    Subjective Data  Subjective: I'm ready to walk Patient Stated Goal: Resume previous lifestyle with decreased pain   Prior Functioning  Home Living Lives With: Spouse Available Help at Discharge: Family Type of Home: House Home Access: Stairs to enter Secretary/administrator of Steps: 1 Entrance Stairs-Rails: None Home Layout: One level Home Adaptive Equipment: Walker - rolling Prior Function Level of Independence: Independent Able to Take Stairs?: Yes Driving: Yes Vocation: Retired Musician: No difficulties    Cognition  Overall Cognitive Status: Appears within functional limits for tasks assessed/performed Arousal/Alertness: Awake/alert Orientation Level: Appears intact for tasks assessed Behavior During Session: [facility] for tasks performed    Extremity/Trunk Assessment Right Upper Extremity Assessment RUE ROM/Strength/Tone: [facility] for tasks assessed Left Upper Extremity Assessment LUE ROM/Strength/Tone: [facility] for tasks assessed Right Lower Extremity  Assessment RLE ROM/Strength/Tone: [facility] for tasks assessed Left Lower Extremity Assessment LLE ROM/Strength/Tone: Deficits LLE ROM/Strength/Tone Deficits: Quads  3/5 wtih aarom at knee -8 - 75   Balance    End of Session PT - End of Session Activity Tolerance: Patient tolerated treatment well Patient left: in chair;with call bell/phone within reach Nurse Communication: Mobility status  GP     Shereese Bonnie 02/21/2012, 5:31 PM

## 2012-02-21 NOTE — Plan of Care (Signed)
Problem: Phase I Progression Outcomes Goal: Dangle or out of bed evening of surgery Outcome: Not Met (add Reason) Nausea not in control state.

## 2012-02-21 NOTE — Care Management Note (Signed)
    Page 1 of 2   02/22/2012     11:03:28 AM   CARE MANAGEMENT NOTE 02/22/2012  Patient:  [name], [name]   Account Number:  [id number]  Date Initiated:  02/21/2012  Documentation initiated by:  Colleen Can  Subjective/Objective Assessment:   dx left knee osteoarthritis; total knee replacemnt on day of admision     Action/Plan:   CM spoke with patient. Plans are for patient to return to her home in [place], [place] where daughter will be caregiver. She already has dme -RW, cane, commode from previous surgery. Wants to use [facility] for [facility] services and wishes to use same PT   Anticipated DC Date:  02/23/2012   Anticipated DC Plan:  HOME W HOME HEALTH SERVICES  In-house referral  NA      DC Planning Services  CM consult      [facility] Choice  HOME HEALTH   Choice offered to / List presented to:  C-1 Patient   DME arranged  NA      DME agency  NA     HH arranged  HH-2 PT      HH agency  OTHER - SEE NOTE   Status of service:  Completed, signed off Medicare Important Message given?   (If response is "NO", the following Medicare IM given date fields will be blank) Date Medicare IM given:   Date Additional Medicare IM given:    Discharge Disposition:  HOME W HOME HEALTH SERVICES  Per UR Regulation:    If discussed at Long Length of Stay Meetings, dates discussed:    Comments:  02/22/12 [name] Yniguez RN,BSN NCM [phone] D/C HOME W/HH [facility] TO START 02/23/12,FAXED D/C SUMMARY,& HHPT ORDER  W/CONFIRMATION TO [phone].PATIENT ALREADY HAD RW,CANE,3N1.  02/21/2012 Damaris Schooner RN CCM [phone] TCT [facility]-intake; spoke with deborah who states they can take provide services for patient. Face sheet, hh orders, op note and h&P faxed to [phone] with confirmation. Services will start Sunday 02/23/2012. Contact information for [facility] given to patient-[phone].

## 2012-02-21 NOTE — Progress Notes (Signed)
Physical Therapy Treatment Patient Details Name: [name] MRN: [id number] DOB: 1950-12-27 Today's Date: 02/21/2012 Time: 6578-4696 PT Time Calculation (min): 17 min  PT Assessment / Plan / Recommendation Comments on Treatment Session       Follow Up Recommendations  Home health PT     Does the patient have the potential to tolerate intense rehabilitation     Barriers to Discharge None      Equipment Recommendations  None recommended by PT    Recommendations for Other Services OT consult  Frequency 7X/week   Plan Discharge plan remains appropriate    Precautions / Restrictions Precautions Precautions: Knee Restrictions Weight Bearing Restrictions: No Other Position/Activity Restrictions: WBAT   Pertinent Vitals/Pain     Mobility  Bed Mobility Bed Mobility: Sit to Supine Supine to Sit: 4: Min assist Sit to Supine: 4: Min guard Details for Bed Mobility Assistance: min cues for sequence Transfers Transfers: Sit to Stand;Stand to Sit Sit to Stand: 4: Min assist Stand to Sit: 4: Min assist Details for Transfer Assistance: cues for LE management and use of UEs to self assist Ambulation/Gait Ambulation/Gait Assistance: 4: Min assist Ambulation Distance (Feet): 106 Feet Assistive device: Rolling walker Ambulation/Gait Assistance Details: min cues for posture and position from RW Gait Pattern: Step-to pattern    Exercises Total Joint Exercises Ankle Circles/Pumps: AROM;Both;10 reps;Supine Quad Sets: AROM;10 reps;Both;Supine Heel Slides: AAROM;10 reps;Supine;Left Straight Leg Raises: AROM;AAROM;15 reps;Left;Supine   PT Diagnosis: Difficulty walking  PT Problem List: Decreased strength;Decreased range of motion;Decreased activity tolerance;Decreased mobility;Decreased knowledge of use of DME;Pain PT Treatment Interventions: DME instruction;Gait training;Stair training;Functional mobility training;Therapeutic activities;Therapeutic exercise;Patient/family  education   PT Goals Acute Rehab PT Goals PT Goal Formulation: With patient Time For Goal Achievement: 02/25/12 Potential to Achieve Goals: Good Pt will go Supine/Side to Sit: with supervision PT Goal: Supine/Side to Sit - Progress: Progressing toward goal Pt will go Sit to Supine/Side: with supervision PT Goal: Sit to Supine/Side - Progress: Progressing toward goal Pt will go Sit to Stand: with supervision PT Goal: Sit to Stand - Progress: Progressing toward goal Pt will go Stand to Sit: with supervision PT Goal: Stand to Sit - Progress: Progressing toward goal Pt will Ambulate: 51 - 150 feet;with supervision;with rolling walker PT Goal: Ambulate - Progress: Progressing toward goal Pt will Go Up / Down Stairs: 1-2 stairs;with min assist;with rolling walker PT Goal: Up/Down Stairs - Progress: Goal set today  Visit Information  Last PT Received On: 02/21/12 Assistance Needed: +1    Subjective Data  Subjective: I'm ready to walk Patient Stated Goal: Resume previous lifestyle with decreased pain   Cognition  Overall Cognitive Status: Appears within functional limits for tasks assessed/performed Arousal/Alertness: Awake/alert Orientation Level: Appears intact for tasks assessed Behavior During Session: [facility] for tasks performed    Balance     End of Session PT - End of Session Activity Tolerance: Patient tolerated treatment well Patient left: in bed;with call bell/phone within reach Nurse Communication: Mobility status   GP     [name] 02/21/2012, 5:34 PM

## 2012-02-22 LAB — BASIC METABOLIC PANEL
BUN: 14 mg/dL (ref 6–23)
Calcium: 8.2 mg/dL — ABNORMAL LOW (ref 8.4–10.5)
Creatinine, Ser: 0.84 mg/dL (ref 0.50–1.10)
GFR calc Af Amer: 84 mL/min — ABNORMAL LOW (ref >90)
GFR calc non Af Amer: 72 mL/min — ABNORMAL LOW (ref >90)

## 2012-02-22 LAB — CBC
HCT: 23.6 % — ABNORMAL LOW (ref 36.0–46.0)
MCHC: 34.3 g/dL (ref 30.0–36.0)
MCV: 90.4 fL (ref 78.0–100.0)
Platelets: 177 10*3/uL (ref 150–400)
RDW: 12.3 % (ref 11.5–15.5)

## 2012-02-22 NOTE — Progress Notes (Signed)
Physical Therapy Treatment Patient Details Name: [name] MRN: [id number] DOB: 12-11-1950 Today's Date: 02/22/2012 Time: 6213-0865 PT Time Calculation (min): 41 min  PT Assessment / Plan / Recommendation Comments on Treatment Session       Follow Up Recommendations  Home health PT     Does the patient have the potential to tolerate intense rehabilitation     Barriers to Discharge        Equipment Recommendations  None recommended by PT    Recommendations for Other Services OT consult  Frequency 7X/week   Plan Discharge plan remains appropriate    Precautions / Restrictions Precautions Precautions: Knee Restrictions Weight Bearing Restrictions: No Other Position/Activity Restrictions: WBAT   Pertinent Vitals/Pain     Mobility  Transfers Transfers: Sit to Stand;Stand to Sit Sit to Stand: 5: Supervision Stand to Sit: 5: Supervision Details for Transfer Assistance: cues for LE management and use of UEs to self assist Ambulation/Gait Ambulation/Gait Assistance: 4: Min guard;5: Supervision Ambulation Distance (Feet): 136 Feet Assistive device: Rolling walker Ambulation/Gait Assistance Details: cues for posture and position from RW Gait Pattern: Step-to pattern Stairs: Yes Stairs Assistance: 4: Min assist Stairs Assistance Details (indicate cue type and reason): min cues for sequence and foot/RW placement Stair Management Technique: No rails;Forwards;With walker Number of Stairs: 1     Exercises Total Joint Exercises Ankle Circles/Pumps: AROM;Both;Supine;20 reps Quad Sets: AROM;Both;Supine;20 reps Heel Slides: AAROM;Supine;Left;20 reps Straight Leg Raises: AROM;20 reps;Left;Supine Long Arc Quad: AROM;20 reps;Seated;Both   PT Diagnosis:    PT Problem List:   PT Treatment Interventions:     PT Goals Acute Rehab PT Goals PT Goal Formulation: With patient Time For Goal Achievement: 02/25/12 Potential to Achieve Goals: Good Pt will go Supine/Side to  Sit: with supervision PT Goal: Supine/Side to Sit - Progress: Progressing toward goal Pt will go Sit to Supine/Side: with supervision PT Goal: Sit to Supine/Side - Progress: Progressing toward goal Pt will go Sit to Stand: with supervision PT Goal: Sit to Stand - Progress: Met Pt will go Stand to Sit: with supervision PT Goal: Stand to Sit - Progress: Met Pt will Ambulate: 51 - 150 feet;with supervision;with rolling walker PT Goal: Ambulate - Progress: Met Pt will Go Up / Down Stairs: 1-2 stairs;with min assist;with rolling walker PT Goal: Up/Down Stairs - Progress: Met  Visit Information  Last PT Received On: 02/22/12 Assistance Needed: +1    Subjective Data  Subjective: As soon as we are done, I get to go home Patient Stated Goal: Resume previous lifestyle with decreased pain   Cognition  Overall Cognitive Status: Appears within functional limits for tasks assessed/performed Arousal/Alertness: Awake/alert Orientation Level: Appears intact for tasks assessed Behavior During Session: [facility] for tasks performed    Balance     End of Session PT - End of Session Activity Tolerance: Patient tolerated treatment well Patient left: with call bell/phone within reach;in chair;with family/visitor present Nurse Communication: Mobility status   GP     Zekiah Caruth 02/22/2012, 12:41 PM

## 2012-02-22 NOTE — Progress Notes (Signed)
Pt to d/c home. AVS reviewed and "My Chart" discussed with pt. Pt capable of verbalizing medications and follow-up appointments. Remains hemodynamically stable. No signs and symptoms of distress. Educated pt to return to ER in the case of SOB, dizziness, or chest pain.  

## 2012-02-22 NOTE — Progress Notes (Signed)
Patient ID: [name], female   DOB: [birth year], 61 y.o.   MRN: [id number] Subjective: 2 Days Post-Op Procedure(s) (LRB): TOTAL KNEE ARTHROPLASTY (Left)    Patient reports pain as mild to moderate.  Feels that she is doing better with this knee compared to her right knee replacement  Objective:   VITALS:   Filed Vitals:   02/22/12 0412  BP: 124/72  Pulse: 93  Temp: 97.8 F (36.6 C)  Resp: 16    Neurovascular intact Incision: no drainage quad strength intact  LABS  Basename 02/22/12 0521 02/21/12 0428  HGB 8.1* 9.2*  HCT 23.6* 26.6*  WBC 6.2 10.7*  PLT 177 203     Basename 02/22/12 0521 02/21/12 0428  NA 140 134*  K 3.8 4.0  BUN 14 13  CREATININE 0.84 0.69  GLUCOSE 121* 123*    No results found for this basename: LABPT:2,INR:2 in the last 72 hours   Assessment/Plan: 2 Days Post-Op Procedure(s) (LRB): TOTAL KNEE ARTHROPLASTY (Left)   Up with therapy Discharge home with home health today after therapy

## 2012-02-22 NOTE — Discharge Summary (Signed)
Physician Discharge Summary  Patient ID: [name] MRN: [id number] DOB/AGE: [birth year] 61 y.o.  Admit date: 02/20/2012 Discharge date:  02/22/2012  Procedures:  Procedure(s) (LRB): TOTAL KNEE ARTHROPLASTY (Left)  Attending Physician:  Dr. Durene Romans   Admission Diagnoses:   Left knee OA / pain  Discharge Diagnoses:  Principal Problem:  *S/P left TKA Active Problems:  Expected blood loss anemia  Obese  Hyponatremia Anxiety   Depression   Glaucoma   Hypertension   GERD   IBS   Incontinence of urine   Fibromyalgia   Hyperlipidemia   Shortness of breath - WITH EXERTION   Abnormal EKG  PREOP EKG -NSR, CANNOT RULE OUT INFERIOR INFARCT AGE -INDETERMIINATE--FOLLOWED UP BY PT'S MEDICAL DOCTOR WITH ECHOCARDIOGRAM AND PT CLEARED FOR LEFT TOTAL KNEE ARTHROPLASTY SURGERY.   PONV (postoperative nausea and vomiting)   Umbilical hernia   Arthritis   HPI: [name], 61 y.o. female, has a history of pain and functional disability in the left knee due to arthritis and has failed non-surgical conservative treatments for greater than 12 weeks to includeNSAID's and/or analgesics, corticosteriod injections, use of assistive devices and activity modification. Onset of symptoms was gradual, starting 2 years ago with rapidlly worsening course since that time. The patient noted no past surgery on the left knee(s). Patient currently rates pain in the left knee(s) at 9 out of 10 with activity. Patient has pain that interferes with activities of daily living and pain with passive range of motion. Patient has evidence of periarticular osteophytes and joint space narrowing by imaging studies. Risks, benefits and expectations were discussed with the patient. Patient understand the risks, benefits and expectations and wishes to proceed with surgery.  PCP: Ninfa Linden, FNP   Discharged Condition: good  Hospital Course:  Patient underwent the above stated procedure on 02/20/2012.  Patient tolerated the procedure well and brought to the recovery room in good condition and subsequently to the floor.  POD #1 BP: 114/73 ; Pulse: 78 ; Temp: 97.5 F (36.4 C) ; Resp: 16  Pt's foley was removed, as well as the hemovac drain removed. IV was changed to a saline lock. Patient reports pain as mild, pain well controlled. No events throughout the night. Neurovascular intact, dorsiflexion/plantar flexion intact, incision: dressing C/D/I, no cellulitis present and compartment soft.   LABS  Basename  02/21/12 0428   HGB  9.2  HCT  26.6    POD #2  BP: 124/72 ; Pulse: 93 ; Temp: 97.8 F (36.6 C) ; Resp: 16  Patient reports pain as mild to moderate. Feels that she is doing better with this knee compared to her right knee replacement Neurovascular intact, dorsiflexion/plantar flexion intact, incision: dressing C/D/I, no cellulitis present, compartment soft and quad strength intact.  LABS  Basename  02/22/12 0521   HGB  8.1  HCT  23.6    Discharge Exam: General appearance: alert, cooperative and no distress Extremities: Homans sign is negative, no sign of DVT, no edema, redness or tenderness in the calves or thighs and no ulcers, gangrene or trophic changes  Disposition: Home-Health Care Svc with follow up in 2 weeks   Follow-up Information    Follow up with Shelda Pal, MD. In 2 weeks.   Contact information:   [facility] [address] [place] [address] [phone]          Discharge Orders    Future Orders Please Complete By Expires   Diet - low sodium  heart healthy      Call MD / Call 911      Comments:   If you experience chest pain or shortness of breath, CALL 911 and be transported to the hospital emergency room.  If you develope a fever above 101 F, pus (white drainage) or increased drainage or redness at the wound, or calf pain, call your surgeon's office.   Discharge instructions      Comments:   Maintain surgical  dressing for 10-14 days, then replace with gauze and tape. Keep the area dry and clean until follow up. Follow up in 2 weeks at [facility]. Call with any questions or concerns.   Constipation Prevention      Comments:   Drink plenty of fluids.  Prune juice may be helpful.  You may use a stool softener, such as Colace (over the counter) 100 mg twice a day.  Use MiraLax (over the counter) for constipation as needed.   Increase activity slowly as tolerated      TED hose      Comments:   Use stockings (TED hose) for 2 weeks on both leg(s).  You may remove them at night for sleeping.   Change dressing      Comments:   Maintain surgical dressing for 10-14 days, then change the dressing daily with sterile 4 x 4 inch gauze dressing and tape. Keep the area dry and clean.      Current Discharge Medication List    START taking these medications   Details  diphenhydrAMINE (BENADRYL) 25 mg capsule Take 1 capsule (25 mg total) by mouth every 6 (six) hours as needed for itching, allergies or sleep. Qty: 30 capsule    ferrous sulfate 325 (65 FE) MG tablet Take 1 tablet (325 mg total) by mouth 3 (three) times daily after meals.    HYDROcodone-acetaminophen (NORCO) 7.5-325 MG per tablet Take 1-2 tablets by mouth every 4 (four) hours as needed for pain. Qty: 120 tablet, Refills: 0    polyethylene glycol (MIRALAX / GLYCOLAX) packet Take 17 g by mouth 2 (two) times daily. Qty: 14 each      CONTINUE these medications which have CHANGED   Details  aspirin EC 325 MG tablet Take 1 tablet (325 mg total) by mouth 2 (two) times daily. X 4 weeks Qty: 60 tablet, Refills: 0    docusate sodium 100 MG CAPS Take 100 mg by mouth 2 (two) times daily. Qty: 10 capsule      CONTINUE these medications which have NOT CHANGED   Details  buPROPion (WELLBUTRIN SR) 150 MG 12 hr tablet Take 150 mg by mouth 2 (two) times daily.    Calcium Carbonate-Vitamin D (CALCIUM 600 + D PO) Take 1 tablet by mouth 2  (two) times daily.    celecoxib (CELEBREX) 200 MG capsule Take 200 mg by mouth daily.    Cholecalciferol (VITAMIN D3) 2000 UNITS TABS Take 2,000 Units by mouth daily.    clonazePAM (KLONOPIN) 0.5 MG tablet Take 0.5-1 mg by mouth 3 (three) times daily. 1 tablet bid and 2 tablets at 8 PM    estradiol (VIVELLE-DOT) 0.1 MG/24HR Place 1 patch onto the skin 2 (two) times a week. Wednesday and Saturday    gabapentin (NEURONTIN) 600 MG tablet Take 900 mg by mouth daily. 300MG  AFTER BRK AND AFTER DINNER TAKES ONE 600MG     hydrocortisone (ANUSOL-HC) 2.5 % rectal cream Place rectally 2 (two) times daily. TOPICAL TO HEMORRHOIDS IF NEEDED    lisinopril-hydrochlorothiazide (  PRINZIDE,ZESTORETIC) 20-12.5 MG per tablet Take 2 tablets by mouth every morning.    Multiple Vitamin (MULTIVITAMIN WITH MINERALS) TABS Take 1 tablet by mouth daily.    Omega-3 Fatty Acids (FISH OIL) 1200 MG CAPS Take 1,200 mg by mouth daily.    omeprazole (PRILOSEC) 20 MG capsule Take 20 mg by mouth daily. BEFORE BKF    pravastatin (PRAVACHOL) 40 MG tablet Take 40 mg by mouth at bedtime.    ranitidine (ZANTAC) 300 MG tablet Take 300 mg by mouth at bedtime.    risperiDONE (RISPERDAL) 0.25 MG tablet Take 0.25 mg by mouth daily. AT 8 PM    sertraline (ZOLOFT) 50 MG tablet Take 200 mg by mouth every morning.    Vitamins C E (CRANBERRY CONCENTRATE PO) Take 1 tablet by mouth daily.    oxymetazoline (AFRIN 12 HOUR) 0.05 % nasal spray Place 2 sprays into the nose 2 (two) times daily.         Signed: Anastasio Auerbach. Werner Labella   PAC  02/22/2012, 7:43 AM

## 2012-05-07 ENCOUNTER — Encounter: Payer: Self-pay | Admitting: Gastroenterology

## 2012-10-23 ENCOUNTER — Encounter: Payer: Self-pay | Admitting: Gastroenterology

## 2012-11-20 ENCOUNTER — Other Ambulatory Visit: Payer: Self-pay

## 2012-11-20 DIAGNOSIS — Z1231 Encounter for screening mammogram for malignant neoplasm of breast: Secondary | ICD-10-CM

## 2012-12-15 ENCOUNTER — Ambulatory Visit
Admission: RE | Admit: 2012-12-15 | Discharge: 2012-12-15 | Disposition: A | Discharge status: Home / Self Care | Payer: PRIVATE HEALTH INSURANCE | Source: Ambulatory Visit

## 2012-12-15 DIAGNOSIS — Z1231 Encounter for screening mammogram for malignant neoplasm of breast: Secondary | ICD-10-CM

## 2013-07-24 ENCOUNTER — Emergency Department (HOSPITAL_COMMUNITY): Payer: PRIVATE HEALTH INSURANCE

## 2013-07-24 ENCOUNTER — Encounter (HOSPITAL_COMMUNITY): Payer: Self-pay | Admitting: Emergency Medicine

## 2013-07-24 ENCOUNTER — Emergency Department (HOSPITAL_COMMUNITY)
Admission: EM | Admit: 2013-07-24 | Discharge: 2013-07-24 | Disposition: A | Discharge status: Home / Self Care | Payer: PRIVATE HEALTH INSURANCE | Attending: Emergency Medicine | Admitting: Emergency Medicine

## 2013-07-24 DIAGNOSIS — R1032 Left lower quadrant pain: Secondary | ICD-10-CM | POA: Insufficient documentation

## 2013-07-24 DIAGNOSIS — I10 Essential (primary) hypertension: Secondary | ICD-10-CM | POA: Insufficient documentation

## 2013-07-24 DIAGNOSIS — K5900 Constipation, unspecified: Secondary | ICD-10-CM | POA: Insufficient documentation

## 2013-07-24 DIAGNOSIS — Z79899 Other long term (current) drug therapy: Secondary | ICD-10-CM | POA: Insufficient documentation

## 2013-07-24 DIAGNOSIS — Z8669 Personal history of other diseases of the nervous system and sense organs: Secondary | ICD-10-CM | POA: Insufficient documentation

## 2013-07-24 DIAGNOSIS — F3289 Other specified depressive episodes: Secondary | ICD-10-CM | POA: Insufficient documentation

## 2013-07-24 DIAGNOSIS — F329 Major depressive disorder, single episode, unspecified: Secondary | ICD-10-CM | POA: Insufficient documentation

## 2013-07-24 DIAGNOSIS — F411 Generalized anxiety disorder: Secondary | ICD-10-CM | POA: Insufficient documentation

## 2013-07-24 DIAGNOSIS — R110 Nausea: Secondary | ICD-10-CM | POA: Insufficient documentation

## 2013-07-24 DIAGNOSIS — E785 Hyperlipidemia, unspecified: Secondary | ICD-10-CM | POA: Insufficient documentation

## 2013-07-24 DIAGNOSIS — Z8739 Personal history of other diseases of the musculoskeletal system and connective tissue: Secondary | ICD-10-CM | POA: Insufficient documentation

## 2013-07-24 DIAGNOSIS — Z87891 Personal history of nicotine dependence: Secondary | ICD-10-CM | POA: Insufficient documentation

## 2013-07-24 DIAGNOSIS — IMO0002 Reserved for concepts with insufficient information to code with codable children: Secondary | ICD-10-CM | POA: Insufficient documentation

## 2013-07-24 DIAGNOSIS — R3919 Other difficulties with micturition: Secondary | ICD-10-CM | POA: Insufficient documentation

## 2013-07-24 DIAGNOSIS — K219 Gastro-esophageal reflux disease without esophagitis: Secondary | ICD-10-CM | POA: Insufficient documentation

## 2013-07-24 LAB — CBC WITH DIFFERENTIAL/PLATELET
Basophils Absolute: 0 10*3/uL (ref 0.0–0.1)
Basophils Relative: 0 % (ref 0–1)
EOS ABS: 0.3 10*3/uL (ref 0.0–0.7)
EOS PCT: 5 % (ref 0–5)
HEMATOCRIT: 34.3 % — AB (ref 36.0–46.0)
HEMOGLOBIN: 11.5 g/dL — AB (ref 12.0–15.0)
LYMPHS ABS: 1.1 10*3/uL (ref 0.7–4.0)
LYMPHS PCT: 21 % (ref 12–46)
MCH: 29.4 pg (ref 26.0–34.0)
MCHC: 33.5 g/dL (ref 30.0–36.0)
MCV: 87.7 fL (ref 78.0–100.0)
MONOS PCT: 8 % (ref 3–12)
Monocytes Absolute: 0.5 10*3/uL (ref 0.1–1.0)
Neutro Abs: 3.6 10*3/uL (ref 1.7–7.7)
Neutrophils Relative %: 66 % (ref 43–77)
Platelets: 252 10*3/uL (ref 150–400)
RBC: 3.91 MIL/uL (ref 3.87–5.11)
RDW: 12.7 % (ref 11.5–15.5)
WBC: 5.5 10*3/uL (ref 4.0–10.5)

## 2013-07-24 LAB — URINALYSIS, ROUTINE W REFLEX MICROSCOPIC
BILIRUBIN URINE: NEGATIVE
Glucose, UA: NEGATIVE mg/dL
Hgb urine dipstick: NEGATIVE
KETONES UR: NEGATIVE mg/dL
LEUKOCYTES UA: NEGATIVE
NITRITE: NEGATIVE
PH: 6.5 (ref 5.0–8.0)
Protein, ur: NEGATIVE mg/dL
UROBILINOGEN UA: 0.2 mg/dL (ref 0.0–1.0)

## 2013-07-24 LAB — COMPREHENSIVE METABOLIC PANEL
ALK PHOS: 91 U/L (ref 39–117)
ALT: 18 U/L (ref 0–35)
AST: 24 U/L (ref 0–37)
Albumin: 4 g/dL (ref 3.5–5.2)
BUN: 17 mg/dL (ref 6–23)
CALCIUM: 9.5 mg/dL (ref 8.4–10.5)
CO2: 28 meq/L (ref 19–32)
Chloride: 101 mEq/L (ref 96–112)
Creatinine, Ser: 0.91 mg/dL (ref 0.50–1.10)
GFR, EST AFRICAN AMERICAN: 76 mL/min — AB (ref >90)
GFR, EST NON AFRICAN AMERICAN: 65 mL/min — AB (ref >90)
GLUCOSE: 100 mg/dL — AB (ref 70–99)
Potassium: 3.7 mEq/L (ref 3.7–5.3)
Sodium: 141 mEq/L (ref 137–147)
TOTAL PROTEIN: 7.2 g/dL (ref 6.0–8.3)
Total Bilirubin: 0.3 mg/dL (ref 0.3–1.2)

## 2013-07-24 MED ORDER — SODIUM CHLORIDE 0.9 % IV SOLN
INTRAVENOUS | Status: DC
  Administered 2013-07-24: 14:00:00 via INTRAVENOUS

## 2013-07-24 MED ORDER — IOHEXOL 300 MG/ML  SOLN
50.0000 mL | Freq: Once | INTRAMUSCULAR | Status: AC | PRN
  Administered 2013-07-24: 50 mL via ORAL

## 2013-07-24 MED ORDER — IOHEXOL 300 MG/ML  SOLN
100.0000 mL | Freq: Once | INTRAMUSCULAR | Status: AC | PRN
  Administered 2013-07-24: 100 mL via INTRAVENOUS

## 2013-07-24 MED ORDER — ONDANSETRON HCL 4 MG/2ML IJ SOLN
4.0000 mg | Freq: Once | INTRAMUSCULAR | Status: AC
  Administered 2013-07-24: 4 mg via INTRAVENOUS
  Filled 2013-07-24: qty 2

## 2013-07-24 MED ORDER — SODIUM CHLORIDE 0.9 % IJ SOLN
INTRAMUSCULAR | Status: AC
  Filled 2013-07-24: qty 1000

## 2013-07-24 MED ORDER — FENTANYL CITRATE 0.05 MG/ML IJ SOLN
25.0000 ug | Freq: Once | INTRAMUSCULAR | Status: AC
  Administered 2013-07-24: 25 ug via INTRAVENOUS
  Filled 2013-07-24: qty 2

## 2013-07-24 NOTE — ED Notes (Signed)
Pt c/o left lower quad abd pain that started yesterday with nausea, denies any vomiting or diarrhea, pt states that she has been having problems with urinating, states that when she attempts to urinate she had to bend over before she can urinate,

## 2013-07-24 NOTE — Discharge Instructions (Signed)
Take your miralax 1 dose in 8 ounces of fluid every 30 minutes for 2-3 hrs or until you start getting good results then once a day. Recheck if you get a fever, vomiting or your pain feels worse.    Constipation, Adult Constipation is when a person has fewer than 3 bowel movements a week; has difficulty having a bowel movement; or has stools that are dry, hard, or larger than normal. As people grow older, constipation is more common. If you try to fix constipation with medicines that make you have a bowel movement (laxatives), the problem may get worse. Long-term laxative use may cause the muscles of the colon to become weak. A low-fiber diet, not taking in enough fluids, and taking certain medicines may make constipation worse. CAUSES   Certain medicines, such as antidepressants, pain medicine, iron supplements, antacids, and water pills.   Certain diseases, such as diabetes, irritable bowel syndrome (IBS), thyroid disease, or depression.   Not drinking enough water.   Not eating enough fiber-rich foods.   Stress or travel.  Lack of physical activity or exercise.  Not going to the restroom when there is the urge to have a bowel movement.  Ignoring the urge to have a bowel movement.  Using laxatives too much. SYMPTOMS   Having fewer than 3 bowel movements a week.   Straining to have a bowel movement.   Having hard, dry, or larger than normal stools.   Feeling full or bloated.   Pain in the lower abdomen.  Not feeling relief after having a bowel movement. DIAGNOSIS  Your caregiver will take a medical history and perform a physical exam. Further testing may be done for severe constipation. Some tests may include:   A barium enema X-ray to examine your rectum, colon, and sometimes, your small intestine.  A sigmoidoscopy to examine your lower colon.  A colonoscopy to examine your entire colon. TREATMENT  Treatment will depend on the severity of your constipation and  what is causing it. Some dietary treatments include drinking more fluids and eating more fiber-rich foods. Lifestyle treatments may include regular exercise. If these diet and lifestyle recommendations do not help, your caregiver may recommend taking over-the-counter laxative medicines to help you have bowel movements. Prescription medicines may be prescribed if over-the-counter medicines do not work.  HOME CARE INSTRUCTIONS   Increase dietary fiber in your diet, such as fruits, vegetables, whole grains, and beans. Limit high-fat and processed sugars in your diet, such as Jamaica[place] fries, hamburgers, cookies, candies, and soda.   A fiber supplement may be added to your diet if you cannot get enough fiber from foods.   Drink enough fluids to keep your urine clear or pale yellow.   Exercise regularly or as directed by your caregiver.   Go to the restroom when you have the urge to go. Do not hold it.  Only take medicines as directed by your caregiver. Do not take other medicines for constipation without talking to your caregiver first. SEEK IMMEDIATE MEDICAL CARE IF:   You have bright red blood in your stool.   Your constipation lasts for more than 4 days or gets worse.   You have abdominal or rectal pain.   You have thin, pencil-like stools.  You have unexplained weight loss. MAKE SURE YOU:   Understand these instructions.  Will watch your condition.  Will get help right away if you are not doing well or get worse. Document Released: 01/12/2004 Document Revised: 07/08/2011 Document Reviewed: 01/25/2013  Exit[employer]® Patient Information ©2014 [employer]. ° °

## 2013-07-24 NOTE — ED Provider Notes (Signed)
CSN: [id number]     Arrival date & time 07/24/13  1223 History  This chart was scribed for Ward Givens, MD by Quintella Reichert, ED scribe.  This patient was seen in room APA01/APA01 and the patient's care was started at 1:08 PM.   Chief Complaint  Patient presents with  . Abdominal Pain    The history is provided by the patient. No language interpreter was used.    HPI Comments: [name] is a 63 y.o. female with h/o IBS, GERD, umbilical hernia, fibromyalgia, HTN and hyperlipidemia who presents to the Emergency Department complaining of LLQ abdominal pain that began today after waking.  Pt describes pain as "throbbing" and not worsened by anything.  Pt also reports nausea but denies vomiting.  She also states her abdomen feels "heavy" and bloated, as if she has gained weight although she has not objectively gained a large amount of weight.  She denies diarrhea, fever, loss of appetite, constipation, or abdominal distension.  Pt notes that for the past 2 months she has had progressively-worsening urinary difficulty, and for the last month she has had to lean forward while on the toilet in order to empty her bladder.  Pt denies prior h/o similar pain.  She has had a hernia on the right side for several years but has never been advised to f/u with a Careers adviser.  She has had a colonoscopy within the past several years and was not diagnosed with diverticulosis to her knowledge.  She denies recent changes to diet or eating popcorn.  She admits to h/o multiple abdominal surgeries including TAH/BSO, appendectomy, laparoscopy, and cholecystectomy.  Pt is a former smoker.  She uses a hormone patch.    PCP [facility]   Past Medical History  Diagnosis Date  . Anxiety   . Depression   . Glaucoma   . Hypertension   . GERD (gastroesophageal reflux disease)   . IBS (irritable bowel syndrome)   . Incontinence of urine   . Fibromyalgia   . Hyperlipidemia   . Shortness of breath      WITH EXERTION  . Abnormal EKG     PREOP EKG -NSR, CANNOT RULE OUT INFERIOR INFARCT AGE -INDETERMIINATE--FOLLOWED UP BY PT'S MEDICAL DOCTOR WITH ECHOCARDIOGRAM AND PT CLEARED FOR LEFT TOTAL KNEE ARTHROPLASTY SURGERY.  Marland Kitchen PONV (postoperative nausea and vomiting)   . Umbilical hernia     NO PAIN  . Arthritis     Past Surgical History  Procedure Laterality Date  . Transvaginal tape  2000  . Appendectomy  1955  . Abdominal hysterectomy  1990  . Laparoscopy to remove scar tissue  1991  . Joint replacement      LEFT TOTAL HIP REPLACEMENT 1998,  RIGHT TOTAL KNEE REPLACEMENT OCT 2012  . Eye surgery      LASER EYE SURGERY -LEFT FOR TX GLAUCOMA  . Cholecystectomy  2007  . Rt shoulder arthroscopy  2011    . Left knee arthroscopy 2012    . Total knee arthroplasty  02/20/2012    Procedure: TOTAL KNEE ARTHROPLASTY;  Surgeon: Shelda Pal, MD;  Location: [facility];  Service: Orthopedics;  Laterality: Left;    No family history on file.   History  Substance Use Topics  . Smoking status: Former Smoker -- 5 years    Types: Cigarettes  . Smokeless tobacco: Not on file     Comment: QUIT SMOKING 18 YRS AGO  1995  . Alcohol Use: No  lives  at home Lives with spouse  OB History   Grav Para Term Preterm Abortions TAB SAB Ect Mult Living                   Review of Systems  Constitutional: Negative for fever and appetite change.  Gastrointestinal: Positive for nausea and abdominal pain. Negative for vomiting, diarrhea and constipation.       Abdominal "heaviness"  Genitourinary: Positive for difficulty urinating. Negative for dysuria.  All other systems reviewed and are negative.      Allergies  Erythromycin; Methocarbamol; and Sulfa antibiotics  Home Medications   Current Outpatient Rx  Name  Route  Sig  Dispense  Refill  . amLODipine (NORVASC) 5 MG tablet   Oral   Take 5 mg by mouth daily.         Marland Kitchen. buPROPion (WELLBUTRIN SR) 150 MG 12 hr tablet   Oral   Take 150 mg by  mouth 2 (two) times daily.         . Calcium Carbonate-Vitamin D (CALCIUM 600 + D PO)   Oral   Take 1 tablet by mouth 2 (two) times daily.         . Cholecalciferol (VITAMIN D3) 2000 UNITS TABS   Oral   Take 2,000 Units by mouth daily.         . clonazePAM (KLONOPIN) 0.5 MG tablet   Oral   Take 0.25 mg by mouth at bedtime.          Tery Sanfilippo. Docusate Sodium (DSS) 100 MG CAPS   Oral   Take 300 mg by mouth at bedtime.         Marland Kitchen. estradiol (VIVELLE-DOT) 0.1 MG/24HR   Transdermal   Place 1 patch onto the skin 2 (two) times a week. Wednesday and Saturday         . gabapentin (NEURONTIN) 600 MG tablet   Oral   Take 300-600 mg by mouth 2 (two) times daily.         . hydrocortisone (ANUSOL-HC) 2.5 % rectal cream   Rectal   Place rectally 2 (two) times daily. TOPICAL TO HEMORRHOIDS IF NEEDED         . lisinopril-hydrochlorothiazide (PRINZIDE,ZESTORETIC) 20-12.5 MG per tablet   Oral   Take 2 tablets by mouth every morning.         . Multiple Vitamin (MULTIVITAMIN WITH MINERALS) TABS   Oral   Take 1 tablet by mouth daily.         Marland Kitchen. omeprazole (PRILOSEC) 20 MG capsule   Oral   Take 20 mg by mouth daily. BEFORE BKF         . piroxicam (FELDENE) 20 MG capsule   Oral   Take 20 mg by mouth daily.         . polyethylene glycol (MIRALAX / GLYCOLAX) packet   Oral   Take 17 g by mouth daily as needed for moderate constipation.         . pravastatin (PRAVACHOL) 40 MG tablet   Oral   Take 40 mg by mouth at bedtime.         . ranitidine (ZANTAC) 300 MG tablet   Oral   Take 300 mg by mouth at bedtime.         . sertraline (ZOLOFT) 50 MG tablet   Oral   Take 100 mg by mouth every morning.          . Vitamins C E (CRANBERRY CONCENTRATE PO)  Oral   Take 1 tablet by mouth daily.         . diphenhydrAMINE (BENADRYL) 25 mg capsule   Oral   Take 1 capsule (25 mg total) by mouth every 6 (six) hours as needed for itching, allergies or sleep.   30  capsule       BP 112/83  Pulse 92  Temp(Src) 97.8 F (36.6 C) (Oral)  Resp 20  SpO2 97%  Vital signs normal    Physical Exam  Nursing note and vitals reviewed. Constitutional: She is oriented to person, place, and time. She appears well-developed and well-nourished.  Non-toxic appearance. She does not appear ill. No distress.  HENT:  Head: Normocephalic and atraumatic.  Right Ear: External ear normal.  Left Ear: External ear normal.  Nose: Nose normal. No mucosal edema or rhinorrhea.  Mouth/Throat: Oropharynx is clear and moist and mucous membranes are normal. No dental abscesses or uvula swelling.  Eyes: Conjunctivae and EOM are normal. Pupils are equal, round, and reactive to light.  Neck: Normal range of motion and full passive range of motion without pain. Neck supple.  Cardiovascular: Normal rate, regular rhythm and normal heart sounds.  Exam reveals no gallop and no friction rub.   No murmur heard. Pulmonary/Chest: Effort normal and breath sounds normal. No respiratory distress. She has no wheezes. She has no rhonchi. She has no rales. She exhibits no tenderness and no crepitus.  Abdominal: Soft. Normal appearance and bowel sounds are normal. She exhibits no distension. There is tenderness in the left lower quadrant. There is no rebound and no guarding.    Pain worse with palpation than release  Musculoskeletal: Normal range of motion. She exhibits no edema and no tenderness.  Moves all extremities well.   Neurological: She is alert and oriented to person, place, and time. She has normal strength. No cranial nerve deficit.  Skin: Skin is warm, dry and intact. No rash noted. No erythema. No pallor.  Psychiatric: She has a normal mood and affect. Her speech is normal and behavior is normal. Her mood appears not anxious.    ED Course  Procedures (including critical care time)  Medications  0.9 %  sodium chloride infusion ( Intravenous New Bag/Given 07/24/13 1333)  sodium  chloride 0.9 % injection (not administered)  fentaNYL (SUBLIMAZE) injection 25 mcg (25 mcg Intravenous Given 07/24/13 1333)  ondansetron (ZOFRAN) injection 4 mg (4 mg Intravenous Given 07/24/13 1333)  iohexol (OMNIPAQUE) 300 MG/ML solution 50 mL (50 mLs Oral Contrast Given 07/24/13 1420)  iohexol (OMNIPAQUE) 300 MG/ML solution 100 mL (100 mLs Intravenous Contrast Given 07/24/13 1420)     DIAGNOSTIC STUDIES: Oxygen Saturation is 97% on room air, normal by my interpretation.    COORDINATION OF CARE: 1:17 PM-Discussed treatment plan which includes bloodwork, IV fluids, pain medication, anti-emetics, and CT-scan with pt at bedside and pt agreed to plan.   3:36 PM-On recheck pt states her pain is almost gone but she still feels nauseated.  Reviewed normal lab results with pt and CT-scan which is normal apart from hernia containing fat.  Discussed treatment plan which includes treatment for constipation with pt at bedside and pt agreed to plan.    I reviewed her CT scan with the patient and she has a  Moderate stool burden in her colon. We discussed treatment for constipation and that should resolve her pain.      Results for orders placed during the hospital encounter of 07/24/13  CBC WITH DIFFERENTIAL  Result Value Ref Range   WBC 5.5  4.0 - 10.5 K/uL   RBC 3.91  3.87 - 5.11 MIL/uL   Hemoglobin 11.5 (*) 12.0 - 15.0 g/dL   HCT 16.1 (*) 09.6 - 04.5 %   MCV 87.7  78.0 - 100.0 fL   MCH 29.4  26.0 - 34.0 pg   MCHC 33.5  30.0 - 36.0 g/dL   RDW 40.9  81.1 - 91.4 %   Platelets 252  150 - 400 K/uL   Neutrophils Relative % 66  43 - 77 %   Neutro Abs 3.6  1.7 - 7.7 K/uL   Lymphocytes Relative 21  12 - 46 %   Lymphs Abs 1.1  0.7 - 4.0 K/uL   Monocytes Relative 8  3 - 12 %   Monocytes Absolute 0.5  0.1 - 1.0 K/uL   Eosinophils Relative 5  0 - 5 %   Eosinophils Absolute 0.3  0.0 - 0.7 K/uL   Basophils Relative 0  0 - 1 %   Basophils Absolute 0.0  0.0 - 0.1 K/uL  COMPREHENSIVE METABOLIC PANEL       Result Value Ref Range   Sodium 141  137 - 147 mEq/L   Potassium 3.7  3.7 - 5.3 mEq/L   Chloride 101  96 - 112 mEq/L   CO2 28  19 - 32 mEq/L   Glucose, Bld 100 (*) 70 - 99 mg/dL   BUN 17  6 - 23 mg/dL   Creatinine, Ser 7.82  0.50 - 1.10 mg/dL   Calcium 9.5  8.4 - 95.6 mg/dL   Total Protein 7.2  6.0 - 8.3 g/dL   Albumin 4.0  3.5 - 5.2 g/dL   AST 24  0 - 37 U/L   ALT 18  0 - 35 U/L   Alkaline Phosphatase 91  39 - 117 U/L   Total Bilirubin 0.3  0.3 - 1.2 mg/dL   GFR calc non Af Amer 65 (*) >90 mL/min   GFR calc Af Amer 76 (*) >90 mL/min  URINALYSIS, ROUTINE W REFLEX MICROSCOPIC      Result Value Ref Range   Color, Urine YELLOW  YELLOW   APPearance CLEAR  CLEAR   Specific Gravity, Urine <1.005 (*) 1.005 - 1.030   pH 6.5  5.0 - 8.0   Glucose, UA NEGATIVE  NEGATIVE mg/dL   Hgb urine dipstick NEGATIVE  NEGATIVE   Bilirubin Urine NEGATIVE  NEGATIVE   Ketones, ur NEGATIVE  NEGATIVE mg/dL   Protein, ur NEGATIVE  NEGATIVE mg/dL   Urobilinogen, UA 0.2  0.0 - 1.0 mg/dL   Nitrite NEGATIVE  NEGATIVE   Leukocytes, UA NEGATIVE  NEGATIVE   Laboratory interpretation all normal except mild anemia  Ct Abdomen Pelvis W Contrast  07/24/2013   CLINICAL DATA:  Left lower quadrant abdominal pain. Difficulty urinating.  EXAM: CT ABDOMEN AND PELVIS WITH CONTRAST  TECHNIQUE: Multidetector CT imaging of the abdomen and pelvis was performed using the standard protocol following bolus administration of intravenous contrast.  CONTRAST:  OMNIPAQUE IOHEXOL 300 MG/ML SOLN, 50mL OMNIPAQUE IOHEXOL 300 MG/ML SOLN  COMPARISON:  None.  FINDINGS: Cholecystectomy clips. Moderate size right ventral hernia containing fat. This is just below the level of the umbilicus. Left total hip prosthesis with associated streak artifacts. The visualized portions of the urinary bladder appear normal. Surgically absent uterus. No adnexal masses or enlarged lymph nodes.  Normal appearing liver, spleen, adrenal glands and  kidneys. Linear calcification at the posterior  margin of the proximal tail of the pancreas. No visible pancreatic mass. No gastrointestinal abnormalities. No evidence of appendicitis or diverticulitis. Clear lung bases. Lumbar and lower thoracic spine degenerative changes.  IMPRESSION: 1. No acute abnormality. 2. Moderate size right ventral hernia containing fat.   Electronically Signed   By: Gordan Payment M.D.   On: 07/24/2013 14:31      MDM   Final diagnoses:  LLQ abdominal pain  Constipation   MEDS OTC miralax  Plan discharge  Devoria Albe, MD, FACEP   I personally performed the services described in this documentation, which was scribed in my presence. The recorded information has been reviewed and considered.  Devoria Albe, MD, FACEP    Ward Givens, MD 07/24/13 [phone]

## 2014-01-25 ENCOUNTER — Other Ambulatory Visit: Payer: Self-pay

## 2014-01-25 DIAGNOSIS — Z1231 Encounter for screening mammogram for malignant neoplasm of breast: Secondary | ICD-10-CM

## 2014-02-07 ENCOUNTER — Ambulatory Visit
Admission: RE | Admit: 2014-02-07 | Discharge: 2014-02-07 | Disposition: A | Discharge status: Home / Self Care | Payer: Medicare Other | Source: Ambulatory Visit

## 2014-02-07 DIAGNOSIS — Z1231 Encounter for screening mammogram for malignant neoplasm of breast: Secondary | ICD-10-CM

## 2014-02-08 ENCOUNTER — Other Ambulatory Visit: Payer: Self-pay | Admitting: Physician Assistant

## 2014-02-08 DIAGNOSIS — R928 Other abnormal and inconclusive findings on diagnostic imaging of breast: Secondary | ICD-10-CM

## 2014-02-21 ENCOUNTER — Ambulatory Visit
Admission: RE | Admit: 2014-02-21 | Discharge: 2014-02-21 | Disposition: A | Discharge status: Home / Self Care | Payer: Medicare Other | Source: Ambulatory Visit | Attending: Physician Assistant | Admitting: Physician Assistant

## 2014-02-21 DIAGNOSIS — R928 Other abnormal and inconclusive findings on diagnostic imaging of breast: Secondary | ICD-10-CM

## 2014-07-15 ENCOUNTER — Other Ambulatory Visit: Payer: Self-pay

## 2014-07-15 DIAGNOSIS — N6001 Solitary cyst of right breast: Secondary | ICD-10-CM

## 2014-07-18 ENCOUNTER — Other Ambulatory Visit: Payer: Self-pay | Admitting: Physician Assistant

## 2014-07-18 DIAGNOSIS — N6001 Solitary cyst of right breast: Secondary | ICD-10-CM

## 2014-08-08 ENCOUNTER — Ambulatory Visit
Admission: RE | Admit: 2014-08-08 | Discharge: 2014-08-08 | Disposition: A | Discharge status: Home / Self Care | Payer: Medicare Other | Source: Ambulatory Visit

## 2014-08-08 DIAGNOSIS — N6001 Solitary cyst of right breast: Secondary | ICD-10-CM

## 2015-01-20 ENCOUNTER — Other Ambulatory Visit: Payer: Self-pay

## 2015-01-20 DIAGNOSIS — Z1231 Encounter for screening mammogram for malignant neoplasm of breast: Secondary | ICD-10-CM

## 2015-02-24 ENCOUNTER — Ambulatory Visit: Payer: Medicare Other

## 2015-03-02 ENCOUNTER — Ambulatory Visit
Admission: RE | Admit: 2015-03-02 | Discharge: 2015-03-02 | Disposition: A | Discharge status: Home / Self Care | Payer: Medicare Other | Source: Ambulatory Visit

## 2015-03-02 DIAGNOSIS — Z1231 Encounter for screening mammogram for malignant neoplasm of breast: Secondary | ICD-10-CM

## 2015-03-07 ENCOUNTER — Other Ambulatory Visit: Payer: Self-pay | Admitting: Physician Assistant

## 2015-03-07 DIAGNOSIS — R928 Other abnormal and inconclusive findings on diagnostic imaging of breast: Secondary | ICD-10-CM

## 2015-03-16 ENCOUNTER — Ambulatory Visit
Admission: RE | Admit: 2015-03-16 | Discharge: 2015-03-16 | Disposition: A | Discharge status: Home / Self Care | Payer: Medicare Other | Source: Ambulatory Visit | Attending: Physician Assistant | Admitting: Physician Assistant

## 2015-03-16 DIAGNOSIS — R928 Other abnormal and inconclusive findings on diagnostic imaging of breast: Secondary | ICD-10-CM

## 2015-05-12 ENCOUNTER — Other Ambulatory Visit (HOSPITAL_COMMUNITY): Payer: Self-pay | Admitting: Neurological Surgery

## 2015-05-12 DIAGNOSIS — M4712 Other spondylosis with myelopathy, cervical region: Secondary | ICD-10-CM

## 2015-05-25 ENCOUNTER — Ambulatory Visit (HOSPITAL_COMMUNITY)
Admission: RE | Admit: 2015-05-25 | Discharge: 2015-05-25 | Disposition: A | Discharge status: Home / Self Care | Payer: Medicare Other | Source: Ambulatory Visit | Attending: Neurological Surgery | Admitting: Neurological Surgery

## 2015-05-25 DIAGNOSIS — M50323 Other cervical disc degeneration at C6-C7 level: Secondary | ICD-10-CM | POA: Diagnosis not present

## 2015-05-25 DIAGNOSIS — M4712 Other spondylosis with myelopathy, cervical region: Secondary | ICD-10-CM | POA: Diagnosis present

## 2015-05-25 DIAGNOSIS — M50322 Other cervical disc degeneration at C5-C6 level: Secondary | ICD-10-CM | POA: Insufficient documentation

## 2015-05-25 DIAGNOSIS — M50321 Other cervical disc degeneration at C4-C5 level: Secondary | ICD-10-CM | POA: Insufficient documentation

## 2016-02-21 ENCOUNTER — Other Ambulatory Visit: Payer: Self-pay | Admitting: Physician Assistant

## 2016-02-21 DIAGNOSIS — Z1231 Encounter for screening mammogram for malignant neoplasm of breast: Secondary | ICD-10-CM

## 2016-03-27 ENCOUNTER — Ambulatory Visit
Admission: RE | Admit: 2016-03-27 | Discharge: 2016-03-27 | Disposition: A | Discharge status: Home / Self Care | Payer: Medicare Other | Source: Ambulatory Visit | Attending: Physician Assistant | Admitting: Physician Assistant

## 2016-03-27 DIAGNOSIS — Z1231 Encounter for screening mammogram for malignant neoplasm of breast: Secondary | ICD-10-CM

## 2016-04-11 DIAGNOSIS — R2689 Other abnormalities of gait and mobility: Secondary | ICD-10-CM | POA: Insufficient documentation

## 2016-04-11 DIAGNOSIS — G250 Essential tremor: Secondary | ICD-10-CM | POA: Insufficient documentation

## 2017-06-01 ENCOUNTER — Emergency Department: Payer: Medicare Other

## 2017-06-01 ENCOUNTER — Emergency Department
Admission: EM | Admit: 2017-06-01 | Discharge: 2017-06-02 | Disposition: A | Discharge status: Home / Self Care | Payer: Medicare Other | Attending: Emergency Medicine | Admitting: Emergency Medicine

## 2017-06-01 ENCOUNTER — Other Ambulatory Visit: Payer: Self-pay

## 2017-06-01 DIAGNOSIS — Y92009 Unspecified place in unspecified non-institutional (private) residence as the place of occurrence of the external cause: Secondary | ICD-10-CM | POA: Diagnosis not present

## 2017-06-01 DIAGNOSIS — Z96642 Presence of left artificial hip joint: Secondary | ICD-10-CM | POA: Insufficient documentation

## 2017-06-01 DIAGNOSIS — Y999 Unspecified external cause status: Secondary | ICD-10-CM | POA: Diagnosis not present

## 2017-06-01 DIAGNOSIS — Y9389 Activity, other specified: Secondary | ICD-10-CM | POA: Insufficient documentation

## 2017-06-01 DIAGNOSIS — W010XXA Fall on same level from slipping, tripping and stumbling without subsequent striking against object, initial encounter: Secondary | ICD-10-CM | POA: Diagnosis not present

## 2017-06-01 DIAGNOSIS — I10 Essential (primary) hypertension: Secondary | ICD-10-CM | POA: Insufficient documentation

## 2017-06-01 DIAGNOSIS — Z87891 Personal history of nicotine dependence: Secondary | ICD-10-CM | POA: Diagnosis not present

## 2017-06-01 DIAGNOSIS — W19XXXA Unspecified fall, initial encounter: Secondary | ICD-10-CM

## 2017-06-01 DIAGNOSIS — R296 Repeated falls: Secondary | ICD-10-CM | POA: Insufficient documentation

## 2017-06-01 DIAGNOSIS — Z96653 Presence of artificial knee joint, bilateral: Secondary | ICD-10-CM | POA: Insufficient documentation

## 2017-06-01 LAB — CBC WITH DIFFERENTIAL/PLATELET
BASOS ABS: 0 10*3/uL (ref 0–0.1)
Basophils Relative: 0 %
Eosinophils Absolute: 0.2 10*3/uL (ref 0–0.7)
Eosinophils Relative: 3 %
HEMATOCRIT: 38 % (ref 35.0–47.0)
Hemoglobin: 12.7 g/dL (ref 12.0–16.0)
LYMPHS ABS: 1.1 10*3/uL (ref 1.0–3.6)
Lymphocytes Relative: 17 %
MCH: 29.2 pg (ref 26.0–34.0)
MCHC: 33.4 g/dL (ref 32.0–36.0)
MCV: 87.6 fL (ref 80.0–100.0)
Monocytes Absolute: 0.4 10*3/uL (ref 0.2–0.9)
Monocytes Relative: 5 %
NEUTROS ABS: 4.9 10*3/uL (ref 1.4–6.5)
Neutrophils Relative %: 75 %
Platelets: 223 10*3/uL (ref 150–440)
RBC: 4.34 MIL/uL (ref 3.80–5.20)
RDW: 13.6 % (ref 11.5–14.5)
WBC: 6.5 10*3/uL (ref 3.6–11.0)

## 2017-06-01 LAB — COMPREHENSIVE METABOLIC PANEL
ALT: 22 U/L (ref 14–54)
AST: 28 U/L (ref 15–41)
Albumin: 4.1 g/dL (ref 3.5–5.0)
Alkaline Phosphatase: 99 U/L (ref 38–126)
Anion gap: 8 (ref 5–15)
BILIRUBIN TOTAL: 0.4 mg/dL (ref 0.3–1.2)
BUN: 16 mg/dL (ref 6–20)
CALCIUM: 9.4 mg/dL (ref 8.9–10.3)
CO2: 26 mmol/L (ref 22–32)
Chloride: 108 mmol/L (ref 101–111)
Creatinine, Ser: 0.97 mg/dL (ref 0.44–1.00)
GFR calc Af Amer: >60 mL/min (ref >60)
GFR, EST NON AFRICAN AMERICAN: 59 mL/min — AB (ref >60)
Glucose, Bld: 106 mg/dL — ABNORMAL HIGH (ref 65–99)
Potassium: 3.5 mmol/L (ref 3.5–5.1)
Sodium: 142 mmol/L (ref 135–145)
TOTAL PROTEIN: 7.2 g/dL (ref 6.5–8.1)

## 2017-06-01 LAB — URINALYSIS, COMPLETE (UACMP) WITH MICROSCOPIC
Bacteria, UA: NONE SEEN
Bilirubin Urine: NEGATIVE
GLUCOSE, UA: NEGATIVE mg/dL
Hgb urine dipstick: NEGATIVE
Ketones, ur: NEGATIVE mg/dL
Leukocytes, UA: NEGATIVE
Nitrite: NEGATIVE
PROTEIN: NEGATIVE mg/dL
Specific Gravity, Urine: 1.014 (ref 1.005–1.030)
pH: 6 (ref 5.0–8.0)

## 2017-06-01 LAB — TROPONIN I

## 2017-06-01 LAB — GLUCOSE, CAPILLARY: GLUCOSE-CAPILLARY: 103 mg/dL — AB (ref 65–99)

## 2017-06-01 MED ORDER — ONDANSETRON HCL 4 MG/2ML IJ SOLN
4.0000 mg | Freq: Once | INTRAMUSCULAR | Status: AC
  Administered 2017-06-01: 4 mg via INTRAVENOUS

## 2017-06-01 MED ORDER — ONDANSETRON HCL 4 MG/2ML IJ SOLN
INTRAMUSCULAR | Status: AC
  Filled 2017-06-01: qty 2

## 2017-06-01 NOTE — ED Notes (Signed)
Family (son and his wife) reports: that pt calls them on the phone and sometimes is slurring but pt doesn't drink, pt's mother had Parkinson's and pt has tremors, pt walks with a shuffling gait, pt has poor social skills, increasingly isolated, and "refuses to eat right and exercise", pt has seen 3 neurologists and they have been unable to identify biological cause for pt's falls

## 2017-06-01 NOTE — ED Triage Notes (Signed)
Pt fell today while talking with her neighbor, pt was fearful that the horse she was holding was going to turn into her, pt remembers what happened and denies loc, however pt's son states the ground was flat and doesn't think she should have fallen because of the circumstance and states further concern that she has had multi[ple falls in the past week and has used her life alert button multiple times

## 2017-06-01 NOTE — ED Provider Notes (Addendum)
[facility] Emergency Department Provider Note       Time seen: ----------------------------------------- 7:27 PM on 06/01/2017 -----------------------------------------   I have reviewed the triage vital signs and the nursing notes.  HISTORY   Chief Complaint Fall and Head Injury    HPI [name] is a 67 y.o. female with a history of depression, anxiety, fibromyalgia, hyperlipidemia, hypertension, IBS who presents to the ED for a fall that occurred yesterday.  Family states she has been falling a lot.  She wears a life alert bracelet and has been using it to have EMS come to her house and help her stand up.  Patient states she fell onto gravel yesterday.  She does not think she hit her head or loss consciousness.  She denies any recent illness or changes in her medicine.  Past Medical History:  Diagnosis Date  . Abnormal EKG    PREOP EKG -NSR, CANNOT RULE OUT INFERIOR INFARCT AGE -INDETERMIINATE--FOLLOWED UP BY PT'S MEDICAL DOCTOR WITH ECHOCARDIOGRAM AND PT CLEARED FOR LEFT TOTAL KNEE ARTHROPLASTY SURGERY.  Marland Kitchen. Anxiety   . Arthritis   . Depression   . Fibromyalgia   . GERD (gastroesophageal reflux disease)   . Glaucoma   . Hyperlipidemia   . Hypertension   . IBS (irritable bowel syndrome)   . Incontinence of urine   . PONV (postoperative nausea and vomiting)   . Shortness of breath    WITH EXERTION  . Umbilical hernia    NO PAIN    Patient Active Problem List   Diagnosis Date Noted  . Expected blood loss anemia 02/21/2012  . Obese 02/21/2012  . Hyponatremia 02/21/2012  . 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/2012    Past Surgical History:  Procedure Laterality Date  . ABDOMINAL HYSTERECTOMY  1990  . APPENDECTOMY  1955  . CHOLECYSTECTOMY  2007  . EYE SURGERY     LASER EYE SURGERY -LEFT FOR TX GLAUCOMA  . JOINT REPLACEMENT     LEFT TOTAL HIP REPLACEMENT 1998,  RIGHT TOTAL KNEE REPLACEMENT OCT 2012  . LAPAROSCOPY TO REMOVE SCAR TISSUE  1991  . LEFT  KNEE ARTHROSCOPY 2012    . RT SHOULDER ARTHROSCOPY  2011    . TOTAL KNEE ARTHROPLASTY  02/20/2012   Procedure: TOTAL KNEE ARTHROPLASTY;  Surgeon: Shelda PalMatthew D Olin, MD;  Location: [facility];  Service: Orthopedics;  Laterality: Left;  . TRANSVAGINAL TAPE  2000    Allergies Erythromycin; Methocarbamol; and Sulfa antibiotics  Social History Social History   Tobacco Use  . Smoking status: Former Smoker    Years: 5.00    Types: Cigarettes  . Tobacco comment: QUIT SMOKING 18 YRS AGO  1995  Substance Use Topics  . Alcohol use: No  . Drug use: No    Review of Systems Constitutional: Negative for fever. Cardiovascular: Negative for chest pain. Respiratory: Negative for shortness of breath. Gastrointestinal: Negative for abdominal pain, vomiting and diarrhea. Musculoskeletal: Negative for back pain. Skin: Negative for rash. Neurological: Negative for headaches, focal weakness or numbness.  Positive for frequent falls.  All systems negative/normal/unremarkable except as stated in the HPI  ____________________________________________   PHYSICAL EXAM:  VITAL SIGNS: ED Triage Vitals [06/01/17 1853]  Enc Vitals Group     BP 131/73     Pulse Rate 85     Resp 18     Temp 98.5 F (36.9 C)     Temp Source Oral     SpO2 97 %     Weight 198 lb (  89.8 kg)     Height 5\' 2"  (1.575 m)     Head Circumference      Peak Flow      Pain Score      Pain Loc      Pain Edu?      Excl. in GC?    Constitutional: Alert and oriented. Well appearing and in no distress. Eyes: Conjunctivae are normal. Normal extraocular movements. ENT   Head: Normocephalic and atraumatic.   Nose: No congestion/rhinnorhea.   Mouth/Throat: Mucous membranes are moist.   Neck: No stridor. Cardiovascular: Normal rate, regular rhythm. No murmurs, rubs, or gallops. Respiratory: Normal respiratory effort without tachypnea nor retractions. Breath sounds are clear and equal bilaterally. No  wheezes/rales/rhonchi. Gastrointestinal: Soft and nontender. Normal bowel sounds Musculoskeletal: Nontender with normal range of motion in extremities. No lower extremity tenderness nor edema. Neurologic:  Normal speech and language. No gross focal neurologic deficits are appreciated.  Skin:  Skin is warm, dry and intact. No rash noted. Psychiatric: Mood and affect are normal. Speech and behavior are normal.  ____________________________________________  EKG: Interpreted by me.  Sinus rhythm rate 87 bpm, normal PR interval, normal QRS, normal QT.  ____________________________________________  ED COURSE:  As part of my medical decision making, I reviewed the following data within the electronic MEDICAL RECORD NUMBER History obtained from family if available, nursing notes, old chart and ekg, as well as notes from prior ED visits. Patient presented for frequent falls and recent fall yesterday, we will assess with labs and imaging as indicated at this time.   Procedures ____________________________________________   LABS (pertinent positives/negatives)  Labs Reviewed  GLUCOSE, CAPILLARY - Abnormal; Notable for the following components:      Result Value   Glucose-Capillary 103 (*)    All other components within normal limits  CBC WITH DIFFERENTIAL/PLATELET  COMPREHENSIVE METABOLIC PANEL  TROPONIN I  URINALYSIS, COMPLETE (UACMP) WITH MICROSCOPIC    RADIOLOGY Images were viewed by me  CT head IMPRESSION: Chronic appearing small vessel ischemic disease of periventricular white matter. No acute intracranial abnormality. ____________________________________________  DIFFERENTIAL DIAGNOSIS   Subdural hematoma, skull fracture, occult infection, dehydration, electrolyte abnormality, medication side effect, medication noncompliance  FINAL ASSESSMENT AND PLAN  Fall   Plan: Patient had presented for frequent falls with a fall yesterday. Patient's labs thus far are negative but are  still pending. Patient's imaging did reveal any acute process.  She is stable for outpatient follow-up.   Ulice Dash, MD   Note: This note was generated in part or whole with voice recognition software. Voice recognition is usually quite accurate but there are transcription errors that can and very often do occur. I apologize for any typographical errors that were not detected and corrected.     Emily Filbert, MD 06/01/17 2009    Emily Filbert, MD 06/01/17 2009

## 2017-06-01 NOTE — ED Notes (Signed)
Patient transported to CT 

## 2017-06-02 NOTE — ED Notes (Signed)
Pt given lemon lime soda and crackers upon request, states that she is ready to go home as soon as the dr is ready, son at bedside

## 2017-06-02 NOTE — ED Provider Notes (Signed)
-----------------------------------------   1:21 AM on 06/02/2017 -----------------------------------------   Blood pressure 138/85, pulse 83, temperature 98.5 F (36.9 C), temperature source Oral, resp. rate 16, height 5\' 2"  (1.575 m), weight 89.8 kg (198 lb), SpO2 92 %.  Assuming care from Dr. Alphonzo LemmingsMcShane.  In short, Delrae AlfredChristie S Rosario is a 67 y.o. female with a chief complaint of Fall and Head Injury .  Refer to the original H&P for additional details.  The current plan of care is to follow up the results of the urinalysis and reassess the patient.     The patient's family arrived.  I did going until the patient that her workup was unremarkable.  She states that she is ready to go.  The family was wondering what was making her fall but we explained that that would be better evaluated by her specialist who is been evaluating her for falls.  Otherwise I did discuss with the patient that she should use her walker for more stability at home.   Rebecka ApleyWebster, Keltie Labell P, MD 06/02/17 [phone]

## 2017-06-02 NOTE — Discharge Instructions (Signed)
Please follow up with your primary care physician for further evaluation of your falls

## 2017-08-06 ENCOUNTER — Encounter: Payer: Self-pay | Admitting: *Deleted

## 2017-08-07 ENCOUNTER — Other Ambulatory Visit: Payer: Self-pay

## 2017-08-07 ENCOUNTER — Ambulatory Visit (INDEPENDENT_AMBULATORY_CARE_PROVIDER_SITE_OTHER): Payer: Medicare Other | Admitting: Neurology

## 2017-08-07 ENCOUNTER — Encounter: Payer: Self-pay | Admitting: Neurology

## 2017-08-07 ENCOUNTER — Telehealth: Payer: Self-pay | Admitting: Neurology

## 2017-08-07 VITALS — BP 118/70 | HR 83 | Ht 62.0 in | Wt 185.0 lb

## 2017-08-07 DIAGNOSIS — E538 Deficiency of other specified B group vitamins: Secondary | ICD-10-CM | POA: Diagnosis not present

## 2017-08-07 DIAGNOSIS — R269 Unspecified abnormalities of gait and mobility: Secondary | ICD-10-CM | POA: Diagnosis not present

## 2017-08-07 DIAGNOSIS — R5382 Chronic fatigue, unspecified: Secondary | ICD-10-CM | POA: Diagnosis not present

## 2017-08-07 NOTE — Patient Instructions (Signed)
WE will get MRI of the brain and start physical therapy.

## 2017-08-07 NOTE — Progress Notes (Signed)
Reason for visit: Gait disorder  Referring physician: Dr. Ernestine McmurrayHarris  [name] is a 67 y.o. female  History of present illness:  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  The patient reports that she has had bilateral total knee replacements within the last 5 years or so, she has also had a left total hip replacement.  The patient indicates that she has some ongoing gait instability, she will fall forward or backwards, she will use a walking stick or cane when she is outside of the house, she occasionally will fall even with the stick.  The patient reports some numbness in the hands but she does not have any numbness in the feet.  She denies any true weakness of the extremities.  She does have some bladder incontinence, but she has had a bladder resuspension procedure in the past.  She does report some low back pain without pain radiating down the legs on either side.  She denies any significant neck pain at this time.  She has undergone MRI of the cervical spine in 2017 that did not show evidence of spinal cord compression.  She does have some degree of spondylosis.  She has recently undergone a CT scan of the brain that does not show significant small vessel ischemic changes.  She apparently has a family history of Parkinson's disease in the mother and a brother. With a description of the tremor, the mother has what sounds like an action tremor rather than a resting tremor that would be most consistent with an essential tremor, not Parkinson's disease.  The patient has not undergone any physical therapy for her gait problems.  She comes into the office today for an evaluation.  She denies any dizziness or syncope.  Past Medical History:  Diagnosis Date  . Abnormal EKG    PREOP EKG -NSR, CANNOT RULE OUT INFERIOR INFARCT AGE -INDETERMIINATE--FOLLOWED UP BY PT'S MEDICAL DOCTOR  WITH ECHOCARDIOGRAM AND PT CLEARED FOR LEFT TOTAL KNEE ARTHROPLASTY SURGERY.  Marland Kitchen. Anxiety   . Arthritis   . Depression   . Fibromyalgia   . GERD (gastroesophageal reflux disease)   . Glaucoma   . Hyperlipidemia   . Hypertension   . IBS (irritable bowel syndrome)   . Incontinence of urine   . PONV (postoperative nausea and vomiting)   . Shortness of breath    WITH EXERTION  . Umbilical hernia    NO PAIN    Past Surgical History:  Procedure Laterality Date  . ABDOMINAL HYSTERECTOMY  1990  . APPENDECTOMY  1955  . CHOLECYSTECTOMY  2007  . EYE SURGERY     LASER EYE SURGERY -LEFT FOR TX GLAUCOMA  . JOINT REPLACEMENT     LEFT TOTAL HIP REPLACEMENT 1998,  RIGHT TOTAL KNEE REPLACEMENT OCT 2012  . LAPAROSCOPY TO REMOVE SCAR TISSUE  1991  . LEFT KNEE ARTHROSCOPY 2012    . RT SHOULDER ARTHROSCOPY  2011    . TONSILLECTOMY    . TOTAL KNEE ARTHROPLASTY  02/20/2012   Procedure: TOTAL KNEE ARTHROPLASTY;  Surgeon: Shelda PalMatthew D Olin, MD;  Location: [facility];  Service: Orthopedics;  Laterality: Left;  . TRANSVAGINAL TAPE  2000    Family History  Problem Relation Age of Onset  . Bone cancer Mother   . Arthritis Mother   . Cancer Mother   . Parkinsonism Mother   . Arthritis Father   .  Stroke Father   . Diabetes Father   . Breast cancer Daughter   . Stroke Sister   . Parkinsonism Brother   . Diabetes Brother     Social history:  reports that she has quit smoking. Her smoking use included cigarettes. She quit after 5.00 years of use. She has never used smokeless tobacco. She reports that she does not drink alcohol or use drugs.  Medications:  Prior to Admission medications   Medication Sig Start Date End Date Taking? Authorizing Provider  amoxicillin (AMOXIL) 500 MG capsule Take 2,000 mg by mouth as directed. Before dental appointments   Yes [provider]  buPROPion (WELLBUTRIN SR) 150 MG 12 hr tablet Take 150 mg by mouth 2 (two) times daily.   Yes [provider]    clonazePAM (KLONOPIN) 0.5 MG tablet Take 0.25-1 mg by mouth 2 (two) times daily. TAKE  TABLET (0.25MG ) BY MOUTH DAILY AT LUNCHTIME AND 2 TABLETS (1MG ) BY MOUTH EVERY EVENING   Yes [provider]  diphenhydrAMINE (BENADRYL) 25 mg capsule Take 1 capsule (25 mg total) by mouth every 6 (six) hours as needed for itching, allergies or sleep. 02/21/12  Yes Babish, Molli Hazard, PA-C  estradiol (VIVELLE-DOT) 0.1 MG/24HR Place 1 patch onto the skin 2 (two) times a week. Wednesday and Saturday   Yes [provider]  gabapentin (NEURONTIN) 600 MG tablet Take 600 mg by mouth 3 (three) times daily.    Yes [provider]  hydrocortisone (ANUSOL-HC) 2.5 % rectal cream Place rectally 2 (two) times daily. TOPICAL TO HEMORRHOIDS IF NEEDED   Yes [provider]  lisinopril-hydrochlorothiazide (PRINZIDE,ZESTORETIC) 20-12.5 MG per tablet Take 1 tablet by mouth daily.    Yes [provider]  Multiple Vitamin (MULTIVITAMIN WITH MINERALS) TABS Take 1 tablet by mouth daily.   Yes [provider]  omeprazole (PRILOSEC) 20 MG capsule Take 20 mg by mouth daily.    Yes [provider]  polyethylene glycol (MIRALAX / GLYCOLAX) packet Take 17 g by mouth daily as needed for moderate constipation. 02/21/12  Yes Babish, Molli Hazard, PA-C  pravastatin (PRAVACHOL) 80 MG tablet Take 80 mg by mouth at bedtime.    Yes [provider]  ranitidine (ZANTAC) 300 MG tablet Take 300 mg by mouth at bedtime.   Yes [provider]  sertraline (ZOLOFT) 100 MG tablet Take 200 mg by mouth every morning.    Yes [provider]      Allergies  Allergen Reactions  . Erythromycin Other (See Comments)    Upset stomach  . Methocarbamol Nausea Only  . Sulfa Antibiotics Other (See Comments)    Turns eyes red    ROS:  Out of a complete 14 system review of symptoms, the patient complains only of the following symptoms, and all other reviewed systems are  negative.  Fatigue Swelling in the legs Snoring Easy bruising Joint pain, joint swelling Allergies Memory loss, numbness, dizziness, tremor Depression, anxiety, disinterest in activities  Blood pressure 118/70, pulse 83, height 5\' 2"  (1.575 m), weight 185 lb (83.9 kg).  Physical Exam  General: The patient is alert and cooperative at the time of the examination.  The patient is moderately obese.  Eyes: Pupils are equal, round, and reactive to light. Discs are flat bilaterally.  Neck: The neck is supple, no carotid bruits are noted.  Respiratory: The respiratory examination is clear.  Cardiovascular: The cardiovascular examination reveals a regular rate and rhythm, no obvious murmurs or rubs are noted.  Skin:  Extremities are with 1+ edema below the knees bilaterally.  Neurologic Exam  Mental status: The patient is alert and oriented x 3 at the time of the examination. The patient has apparent normal recent and remote memory, with an apparently normal attention span and concentration ability.  Cranial nerves: Facial symmetry is present. There is good sensation of the face to pinprick and soft touch bilaterally. The strength of the facial muscles and the muscles to head turning and shoulder shrug are normal bilaterally. Speech is well enunciated, no aphasia or dysarthria is noted. Extraocular movements are full. Visual fields are full. The tongue is midline, and the patient has symmetric elevation of the soft palate. No obvious hearing deficits are noted.  Motor: The motor testing reveals 5 over 5 strength of all 4 extremities, with exception some weakness with hip flexion on the left. Good symmetric motor tone is noted throughout.  Sensory: Sensory testing is intact to pinprick, soft touch, vibration sensation, and position sense on all 4 extremities. No evidence of extinction is noted.  Coordination: Cerebellar testing reveals good finger-nose-finger and heel-to-shin bilaterally.   The patient does have mild intention tremors with finger-nose-finger bilaterally.  Gait and station: Gait is moderately wide-based, the patient can walk independently.  Bilateral arm swing is seen.  Tandem gait is slightly unsteady.  Romberg is negative. No drift is seen.  Reflexes: Deep tendon reflexes are symmetric, but are depressed in the lower extremities bilaterally. Toes are downgoing bilaterally.   Assessment/Plan:  1.  Gait disorder, likely to be multifactorial  The patient does appear to have a mild tremor and she may have an essential tremor syndrome.  She has had a left total hip replacement, she has had bilateral knee replacements which may impair balance as well, the essential tremor syndrome may be associated with gait instability as well.  The patient will go for blood work today, she will be sent for MRI of the brain.  She will be set up for physical therapy for gait training.  She will follow-up in 4 months.  We may consider EMG and nerve conduction study in the future depending on her clinical course.  She does have some mild weakness with hip flexion on the left.  This likely is related to the prior left total hip replacement.  Marlan Palau MD 08/07/2017 12:18 PM  [facility] [address] [place], [place] [address]  Phone [phone] Fax [phone]

## 2017-08-07 NOTE — Telephone Encounter (Signed)
Medicare/GEHA Berkley Harveyauth: NPR Ref # [id number] on 08/07/17 order sent to GI. They will reach out to the patient to schedule.

## 2017-08-08 ENCOUNTER — Telehealth: Payer: Self-pay

## 2017-08-08 LAB — RPR: RPR Ser Ql: NONREACTIVE

## 2017-08-08 LAB — VITAMIN B12: VITAMIN B 12: 679 pg/mL (ref 232–1245)

## 2017-08-08 LAB — TSH: TSH: 2.33 u[IU]/mL (ref 0.450–4.500)

## 2017-08-08 LAB — SEDIMENTATION RATE: Sed Rate: 7 mm/hr (ref 0–40)

## 2017-08-08 NOTE — Telephone Encounter (Signed)
-----   Message from York Spanielharles K Willis, MD sent at 08/08/2017  7:32 AM EDT -----  The blood work results are unremarkable. Please call the patient.  ----- Message ----- From: Nell RangeInterface, [facility] Lab Results In Sent: 08/08/2017   5:42 AM To: York Spanielharles K Willis, MD

## 2017-08-08 NOTE — Telephone Encounter (Signed)
I called pt to discuss her labs. No answer, left a message at her home number asking her to call me back.  Pt's DPR is not available for my review currently.

## 2017-08-11 ENCOUNTER — Telehealth: Payer: Self-pay | Admitting: *Deleted

## 2017-08-11 NOTE — Telephone Encounter (Signed)
I called pt, left a detailed message on pt's home number, per DPR, advising her that Dr. Anne HahnWillis reviewed her labs and found that they were unremarkable. I asked pt to call me back with any questions or concerns.

## 2017-08-11 NOTE — Telephone Encounter (Signed)
-----   Message from Charles K Willis, MD sent at 08/08/2017  7:32 AM EDT -----  The blood work results are unremarkable. Please call the patient.  ----- Message ----- From: Interface, [facility] Lab Results In Sent: 08/08/2017   5:42 AM To: Charles K Willis, MD   

## 2017-08-11 NOTE — Telephone Encounter (Signed)
Error. Duplicate. See other phone note from YaleKristen, CaliforniaRN.

## 2017-12-08 ENCOUNTER — Ambulatory Visit: Payer: Medicare Other | Admitting: Neurology

## 2018-05-19 ENCOUNTER — Emergency Department (HOSPITAL_COMMUNITY): Payer: Medicare Other

## 2018-05-19 ENCOUNTER — Encounter (HOSPITAL_COMMUNITY): Payer: Self-pay | Admitting: Emergency Medicine

## 2018-05-19 ENCOUNTER — Emergency Department (HOSPITAL_COMMUNITY)
Admission: EM | Admit: 2018-05-19 | Discharge: 2018-05-19 | Disposition: A | Discharge status: Home / Self Care | Payer: Medicare Other | Attending: Emergency Medicine | Admitting: Emergency Medicine

## 2018-05-19 ENCOUNTER — Other Ambulatory Visit: Payer: Self-pay

## 2018-05-19 DIAGNOSIS — S0990XA Unspecified injury of head, initial encounter: Secondary | ICD-10-CM | POA: Diagnosis present

## 2018-05-19 DIAGNOSIS — Z87891 Personal history of nicotine dependence: Secondary | ICD-10-CM | POA: Diagnosis not present

## 2018-05-19 DIAGNOSIS — Y999 Unspecified external cause status: Secondary | ICD-10-CM | POA: Diagnosis not present

## 2018-05-19 DIAGNOSIS — Y929 Unspecified place or not applicable: Secondary | ICD-10-CM | POA: Diagnosis not present

## 2018-05-19 DIAGNOSIS — Z96642 Presence of left artificial hip joint: Secondary | ICD-10-CM | POA: Insufficient documentation

## 2018-05-19 DIAGNOSIS — Z79899 Other long term (current) drug therapy: Secondary | ICD-10-CM | POA: Diagnosis not present

## 2018-05-19 DIAGNOSIS — Y939 Activity, unspecified: Secondary | ICD-10-CM | POA: Insufficient documentation

## 2018-05-19 DIAGNOSIS — S0181XA Laceration without foreign body of other part of head, initial encounter: Secondary | ICD-10-CM | POA: Diagnosis not present

## 2018-05-19 DIAGNOSIS — I10 Essential (primary) hypertension: Secondary | ICD-10-CM | POA: Diagnosis not present

## 2018-05-19 DIAGNOSIS — G20 Parkinson's disease: Secondary | ICD-10-CM | POA: Insufficient documentation

## 2018-05-19 DIAGNOSIS — Z7902 Long term (current) use of antithrombotics/antiplatelets: Secondary | ICD-10-CM | POA: Diagnosis not present

## 2018-05-19 DIAGNOSIS — W19XXXA Unspecified fall, initial encounter: Secondary | ICD-10-CM

## 2018-05-19 DIAGNOSIS — Z96641 Presence of right artificial hip joint: Secondary | ICD-10-CM | POA: Insufficient documentation

## 2018-05-19 DIAGNOSIS — W0110XA Fall on same level from slipping, tripping and stumbling with subsequent striking against unspecified object, initial encounter: Secondary | ICD-10-CM | POA: Insufficient documentation

## 2018-05-19 DIAGNOSIS — Y92009 Unspecified place in unspecified non-institutional (private) residence as the place of occurrence of the external cause: Secondary | ICD-10-CM

## 2018-05-19 MED ORDER — LIDOCAINE-EPINEPHRINE (PF) 1 %-1:200000 IJ SOLN
INTRAMUSCULAR | Status: AC
  Administered 2018-05-19: 02:00:00
  Filled 2018-05-19: qty 30

## 2018-05-19 NOTE — ED Provider Notes (Signed)
[facility] EMERGENCY DEPARTMENT Provider Note   CSN: [id number] Arrival date & time: 05/19/18  0000  Time seen 1:05 AM   History   Chief Complaint Chief Complaint  Patient presents with  . Fall    HPI [name] is a 68 y.o. female.  HPI patient states she has been having balance problems lately and has seen a neurologist in [place] who thought she had Parkinson's, a neurologist in [place] who did not give an opinion to her and a neurologist at [facility] that told her he thought it was from her medications.  She states she was putting laundry away and she had dropped a sock and when she bent over to pick it up she just kept on falling and hit a light switch and then hit a low boy.  She hit her left forehead.  She denies any loss of consciousness.  She denies having any pain.  She denies being on a blood thinner other than a baby aspirin a day.  She does not know when her last tetanus booster was, however she is seen by her PCP every 3 months.  We discussed having her check in the morning to see if she has had 1 in the past 10 years.  PCP [facility]   Past Medical History:  Diagnosis Date  . Abnormal EKG    PREOP EKG -NSR, CANNOT RULE OUT INFERIOR INFARCT AGE -INDETERMIINATE--FOLLOWED UP BY PT'S MEDICAL DOCTOR WITH ECHOCARDIOGRAM AND PT CLEARED FOR LEFT TOTAL KNEE ARTHROPLASTY SURGERY.  Marland Kitchen Anxiety   . Arthritis   . Depression   . Fibromyalgia   . GERD (gastroesophageal reflux disease)   . Glaucoma   . Hyperlipidemia   . Hypertension   . IBS (irritable bowel syndrome)   . Incontinence of urine   . PONV (postoperative nausea and vomiting)   . Shortness of breath    WITH EXERTION  . Umbilical hernia    NO PAIN    Patient Active Problem List   Diagnosis Date Noted  . Expected blood loss anemia 02/21/2012  . Obese 02/21/2012  . Hyponatremia 02/21/2012  . 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/2012    Past Surgical History:  Procedure Laterality Date  .  ABDOMINAL HYSTERECTOMY  1990  . APPENDECTOMY  1955  . CHOLECYSTECTOMY  2007  . EYE SURGERY     LASER EYE SURGERY -LEFT FOR TX GLAUCOMA  . JOINT REPLACEMENT     LEFT TOTAL HIP REPLACEMENT 1998,  RIGHT TOTAL KNEE REPLACEMENT OCT 2012  . LAPAROSCOPY TO REMOVE SCAR TISSUE  1991  . LEFT KNEE ARTHROSCOPY 2012    . RT SHOULDER ARTHROSCOPY  2011    . TONSILLECTOMY    . TOTAL KNEE ARTHROPLASTY  02/20/2012   Procedure: TOTAL KNEE ARTHROPLASTY;  Surgeon: Shelda Pal, MD;  Location: [facility];  Service: Orthopedics;  Laterality: Left;  . TRANSVAGINAL TAPE  2000     OB History   No obstetric history on file.      Home Medications    Prior to Admission medications   Medication Sig Start Date End Date Taking? Authorizing Provider  amoxicillin (AMOXIL) 500 MG capsule Take 2,000 mg by mouth as directed. Before dental appointments    [provider]  buPROPion (WELLBUTRIN SR) 150 MG 12 hr tablet Take 150 mg by mouth 2 (two) times daily.    [provider]  clonazePAM (KLONOPIN) 0.5 MG tablet Take 0.25-1 mg by mouth 2 (two) times daily. TAKE  TABLET (0.25MG ) BY MOUTH DAILY AT LUNCHTIME AND 2 TABLETS (1MG ) BY MOUTH EVERY EVENING    [provider]  diphenhydrAMINE (BENADRYL) 25 mg capsule Take 1 capsule (25 mg total) by mouth every 6 (six) hours as needed for itching, allergies or sleep. 02/21/12   Lanney GinsBabish, Matthew, PA-C  estradiol (VIVELLE-DOT) 0.1 MG/24HR Place 1 patch onto the skin 2 (two) times a week. Wednesday and Saturday    [provider]  gabapentin (NEURONTIN) 600 MG tablet Take 600 mg by mouth 3 (three) times daily.     [provider]  hydrocortisone (ANUSOL-HC) 2.5 % rectal cream Place rectally 2 (two) times daily. TOPICAL TO HEMORRHOIDS IF NEEDED    [provider]  lisinopril-hydrochlorothiazide (PRINZIDE,ZESTORETIC) 20-12.5 MG per tablet Take 1 tablet by mouth daily.     [provider]  Multiple Vitamin (MULTIVITAMIN WITH  MINERALS) TABS Take 1 tablet by mouth daily.    [provider]  omeprazole (PRILOSEC) 20 MG capsule Take 20 mg by mouth daily.     [provider]  polyethylene glycol (MIRALAX / GLYCOLAX) packet Take 17 g by mouth daily as needed for moderate constipation. 02/21/12   Lanney GinsBabish, Matthew, PA-C  pravastatin (PRAVACHOL) 80 MG tablet Take 80 mg by mouth at bedtime.     [provider]  ranitidine (ZANTAC) 300 MG tablet Take 300 mg by mouth at bedtime.    [provider]  sertraline (ZOLOFT) 100 MG tablet Take 200 mg by mouth every morning.     [provider]    Family History Family History  Problem Relation Age of Onset  . Bone cancer Mother   . Arthritis Mother   . Cancer Mother   . Parkinsonism Mother   . Arthritis Father   . Stroke Father   . Diabetes Father   . Breast cancer Daughter   . Stroke Sister   . Parkinsonism Brother   . Diabetes Brother     Social History Social History   Tobacco Use  . Smoking status: Former Smoker    Years: 5.00    Types: Cigarettes  . Smokeless tobacco: Never Used  . Tobacco comment: QUIT SMOKING 18 YRS AGO  1995  Substance Use Topics  . Alcohol use: No  . Drug use: No  lives at home Lives alone   Allergies   Erythromycin; Methocarbamol; and Sulfa antibiotics   Review of Systems Review of Systems  All other systems reviewed and are negative.    Physical Exam Updated Vital Signs BP (!) 156/91 (BP Location: Left Arm)   Pulse 73   Temp 98.3 F (36.8 C) (Oral)   Resp 20   Ht 5\' 2"  (1.575 m)   Wt 81.6 kg   SpO2 96%   BMI 32.92 kg/m   Vital signs normal except for hypertension   Physical Exam Vitals signs and nursing note reviewed.  Constitutional:      General: She is not in acute distress.    Appearance: Normal appearance. She is well-developed. She is not ill-appearing or toxic-appearing.  HENT:     Head: Normocephalic.     Comments: Patient has a 1.8 cm laceration in her  left forehead with a lot of swelling in the same area. Hair soaked with blood    Right Ear: External ear normal.     Left Ear: External ear normal.     Nose: Nose normal. No mucosal edema or rhinorrhea.     Mouth/Throat:     Mouth:  Mucous membranes are moist.     Dentition: No dental abscesses.     Pharynx: No uvula swelling.  Eyes:     Conjunctiva/sclera: Conjunctivae normal.     Pupils: Pupils are equal, round, and reactive to light.  Neck:     Musculoskeletal: Full passive range of motion without pain, normal range of motion and neck supple. No muscular tenderness.  Cardiovascular:     Rate and Rhythm: Normal rate and regular rhythm.     Heart sounds: Normal heart sounds. No murmur. No friction rub. No gallop.   Pulmonary:     Effort: Pulmonary effort is normal. No respiratory distress.     Breath sounds: Normal breath sounds. No wheezing, rhonchi or rales.  Chest:     Chest wall: No tenderness or crepitus.  Abdominal:     General: Bowel sounds are normal. There is no distension.     Palpations: Abdomen is soft.     Tenderness: There is no abdominal tenderness. There is no guarding or rebound.  Musculoskeletal: Normal range of motion.        General: No tenderness.     Comments: Moves all extremities well.   Skin:    General: Skin is warm and dry.     Coloration: Skin is not pale.     Findings: No erythema or rash.  Neurological:     General: No focal deficit present.     Mental Status: She is alert and oriented to person, place, and time.     Cranial Nerves: No cranial nerve deficit.     Comments: Grips are equal, there is no pronator drift, there is no focal motor weakness.  Psychiatric:        Mood and Affect: Mood is not anxious.        Speech: Speech normal.        Behavior: Behavior normal.        ED Treatments / Results  Labs (all labs ordered are listed, but only abnormal results are displayed) Labs Reviewed - No data to  display  EKG None  Radiology Ct Head Wo Contrast  Result Date: 05/19/2018 CLINICAL DATA:  Dizzy, hit forehead with laceration EXAM: CT HEAD WITHOUT CONTRAST TECHNIQUE: Contiguous axial images were obtained from the base of the skull through the vertex without intravenous contrast. COMPARISON:  06/01/2017 FINDINGS: Brain: No acute territorial infarction, hemorrhage or intracranial mass. Atrophy and small vessel ischemic changes of the white matter. Stable ventricle size. Vascular: Hyperdense vessels.  Carotid vascular calcification Skull: Normal. Negative for fracture or focal lesion. Sinuses/Orbits: No acute finding. Other: Small left forehead laceration IMPRESSION: 1. No CT evidence for acute intracranial abnormality. 2. Atrophy and small vessel ischemic changes of the white matter Electronically Signed   By: Jasmine PangKim  Fujinaga M.D.   On: 05/19/2018 02:47    Procedures Procedures (including critical care time)  Medications Ordered in ED Medications  lidocaine-EPINEPHrine (XYLOCAINE-EPINEPHrine) 1 %-1:200000 (PF) injection (  Given by Other 05/19/18 0133)     Initial Impression / Assessment and Plan / ED Course  I have reviewed the triage vital signs and the nursing notes.  Pertinent labs & imaging results that were available during my care of the patient were reviewed by me and considered in my medical decision making (see chart for details).     My PA, Beverely PaceBryant, sutured patient's laceration.  Patient lives alone, so head CT was done.  She does not complain of pain but she will not have someone to  keep a close eye on her.  Patient's head CT  was normal and she was discharged home.    Final Clinical Impressions(s) / ED Diagnoses   Final diagnoses:  Fall in home, initial encounter  Laceration of forehead, initial encounter    ED Discharge Orders    None    OTC acetaminophen  Plan discharge  Devoria Albe, MD, Concha Pyo, MD 05/19/18 [phone]

## 2018-05-19 NOTE — Progress Notes (Signed)
Patient was wearing earrings x2 on arrival to CT. Removed two earrings placed in plastic bag and returned with patient to emergency room.

## 2018-05-19 NOTE — ED Triage Notes (Signed)
Pt fell at home and sustained a laceration above L. Eyebrow.

## 2018-05-19 NOTE — ED Provider Notes (Signed)
LACERATION REPAIR LEFT FOREHEAD  Patient is a 68 year old female who presents to the emergency department with laceration to the left forehead/eyebrow.  The patient bent down to pick up a sock, she lost her balance.  Hit her head on a switch, and then hit another piece of furniture in the home.  She sustained a laceration to the forehead eyebrow.  I discussed with the patient the need for suture repair if she was having active bleeding.  The patient gave permission for repair of the laceration.  Procedural timeout taken.  Patient identified by armband.  The wound was cleansed, it was then painted with Betadine.  The area was infiltrated with 1% lidocaine with epinephrine.  After the area was anesthetized, the wound was further cleansed.  There is no foreign body appreciated.  The wound was then repaired with 4 interrupted sutures of 5-0 nylon.  The wound measures 1.8 cm.  The patient tolerated the procedure without problem.  Sterile dressing was applied.   Ivery Quale, PA-C 05/19/18 0134    Devoria Albe, MD 05/19/18 [phone]

## 2018-05-19 NOTE — Discharge Instructions (Addendum)
Call your doctor's office in the morning to see when your last tetanus booster was, if it was more than 10 years ago you need to get another one.  You have 5 days after your injury to do that. Use ice packs on the swollen area on your forehead, I would expect you would develop some blood settling around your left eyelids in a couple days.  You can take Tylenol if needed for mild pain.  The sutures need to be removed in 4 to 5 days.  Return to the ED for any problems listed on the head injury sheet.

## 2019-03-20 IMAGING — CT CT HEAD W/O CM
3 series · 16 of 47 positions shown, 19 images · non-contrast
Comparison: 06/01/2017

CLINICAL DATA: Dizzy, hit forehead with laceration

EXAM:
CT HEAD WITHOUT CONTRAST
TECHNIQUE: Contiguous axial images were obtained from the base of the skull
through the vertex without intravenous contrast.

[Series 2: head trauma wo · axial · 0.43mm/px · z∈[+304,+429]mm · 10 of 30 slices shown, 13 images]
[im 3/30  brain]
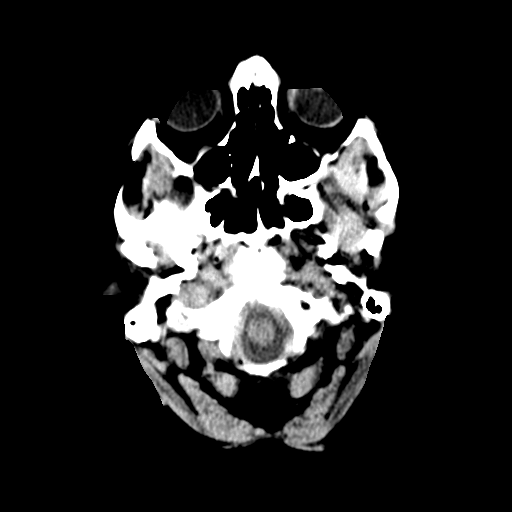
[im 3/30  bone]
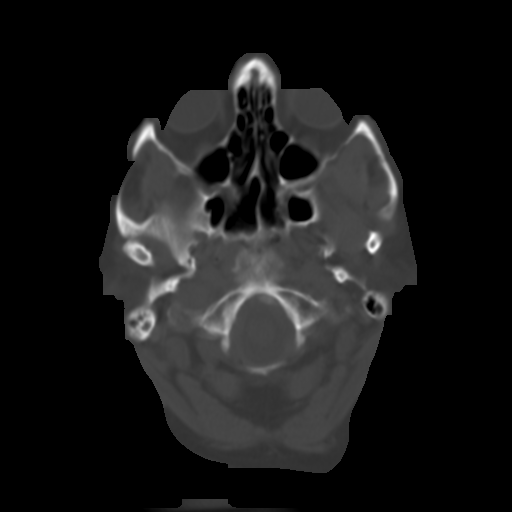
[im 6/30  brain]
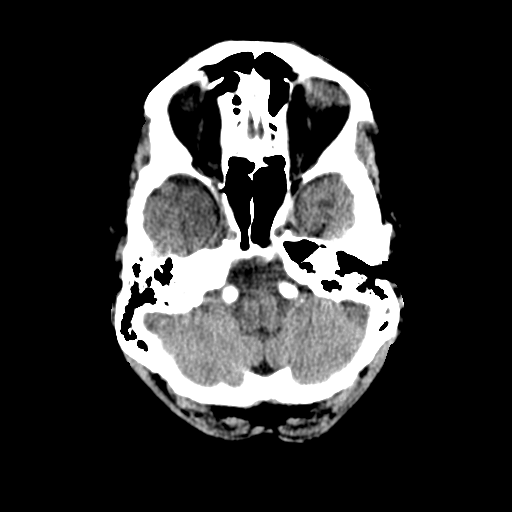
[im 9/30  brain]
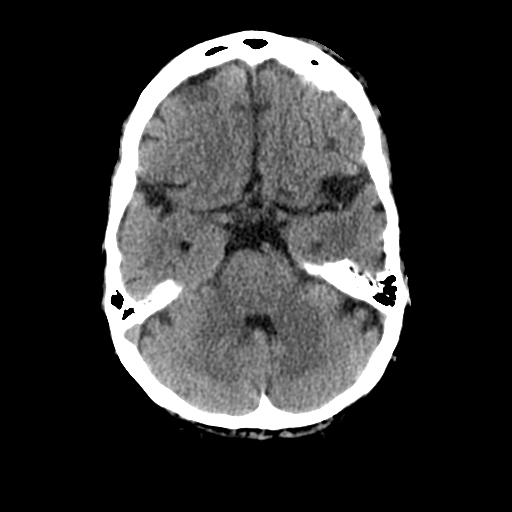
[im 11/30  brain]
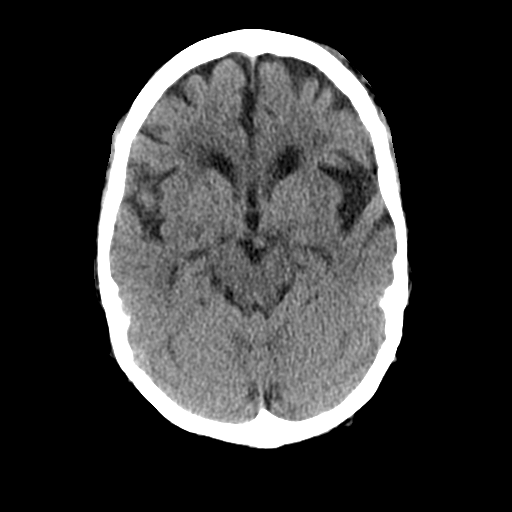
[im 14/30  brain]
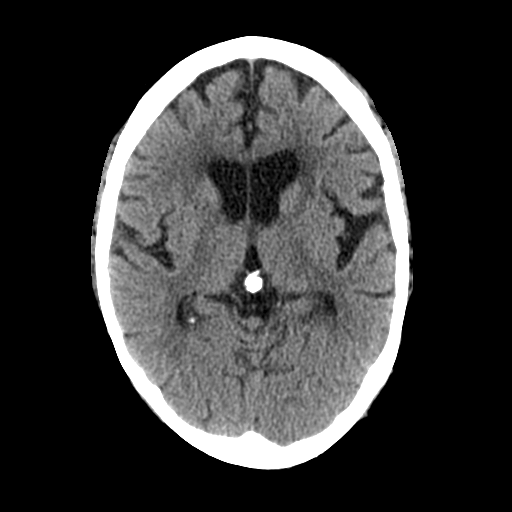
[im 14/30  bone]
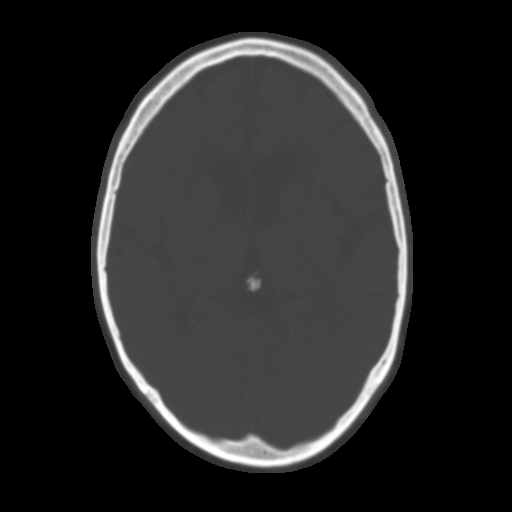
[im 17/30  brain]
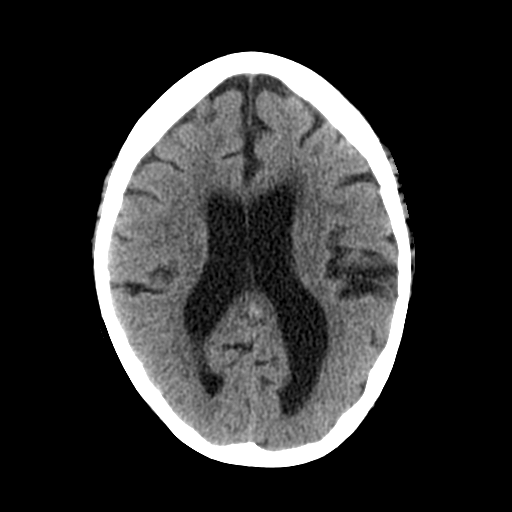
[im 20/30  brain]
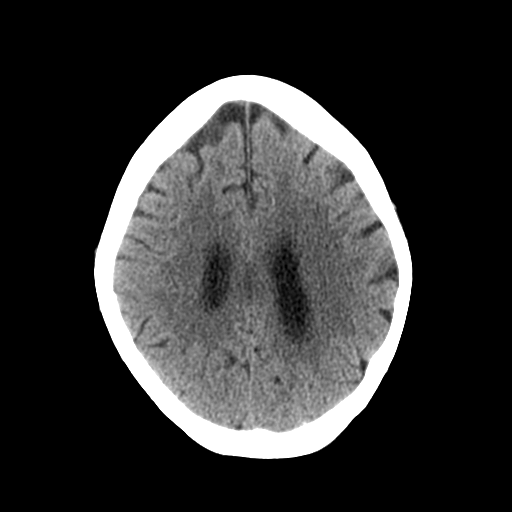
[im 23/30  brain]
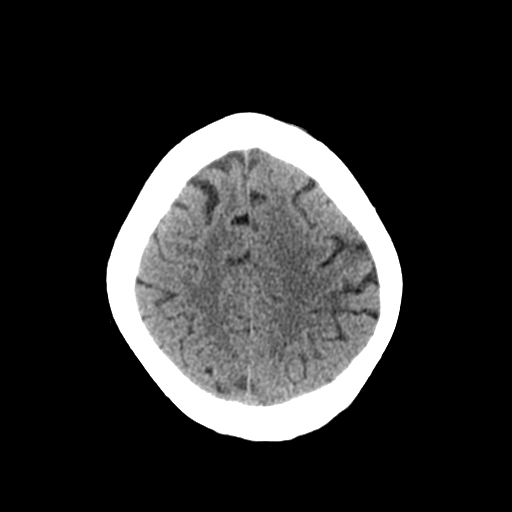
[im 25/30  brain]
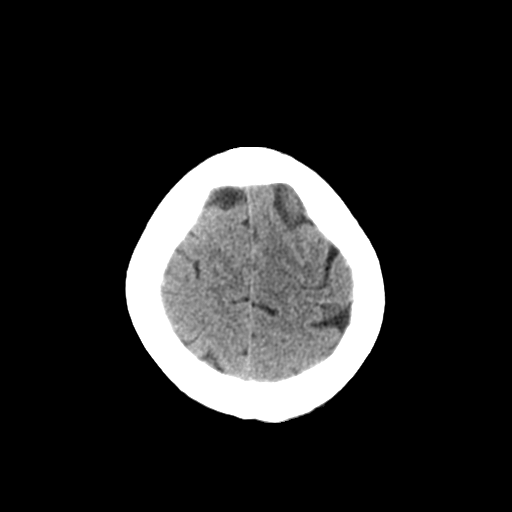
[im 25/30  bone]
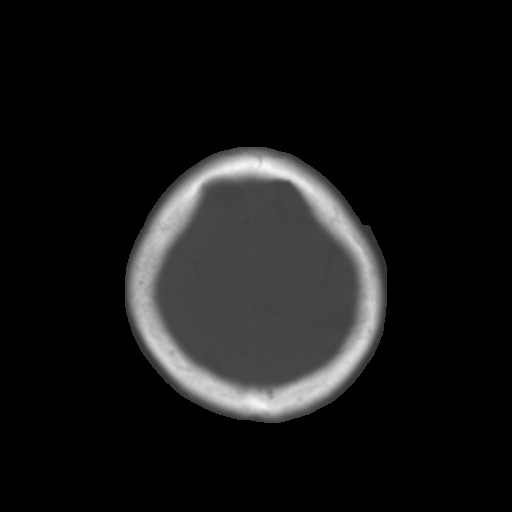
[im 28/30  brain]
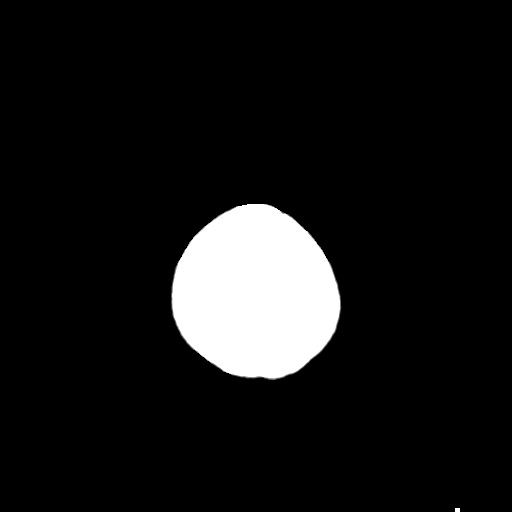

[Series 4: coronal soft tissue · coronal · 0.33mm/px · 3 of 71 slices shown]
[im 24/71  brain]
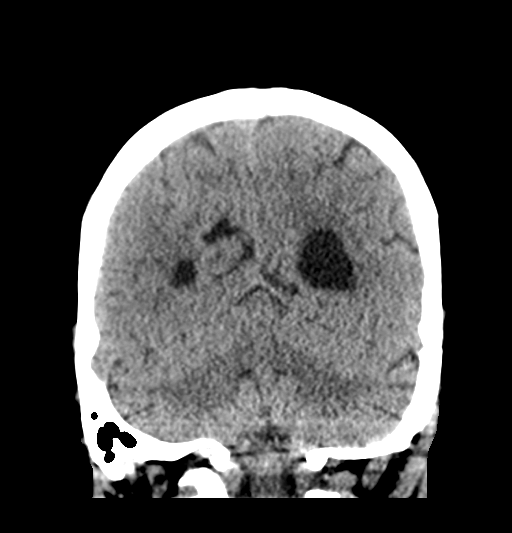
[im 32/71  brain]
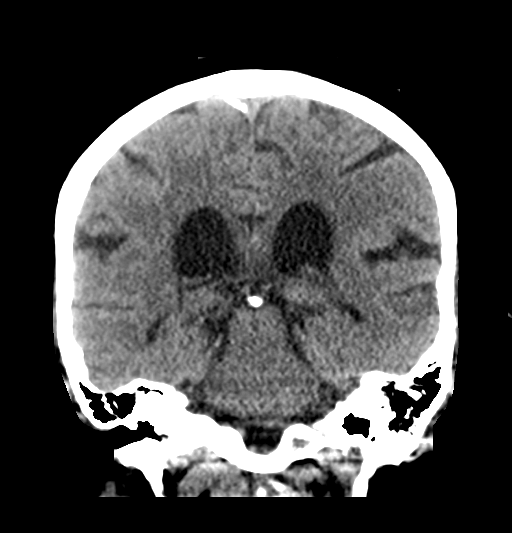
[im 39/71  brain]
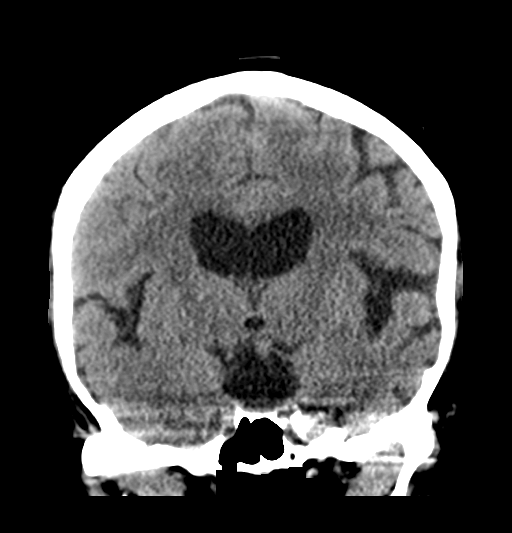

[Series 5: sagittal soft tissue · sagittal · 0.33mm/px · 3 of 56 slices shown]
[im 19/56  brain]
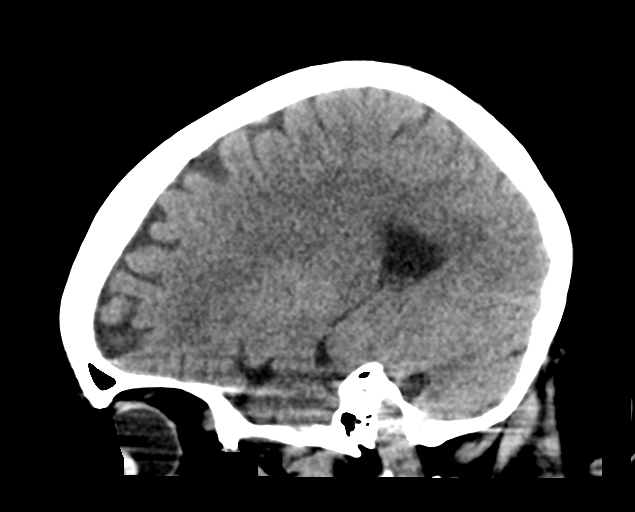
[im 28/56  brain]
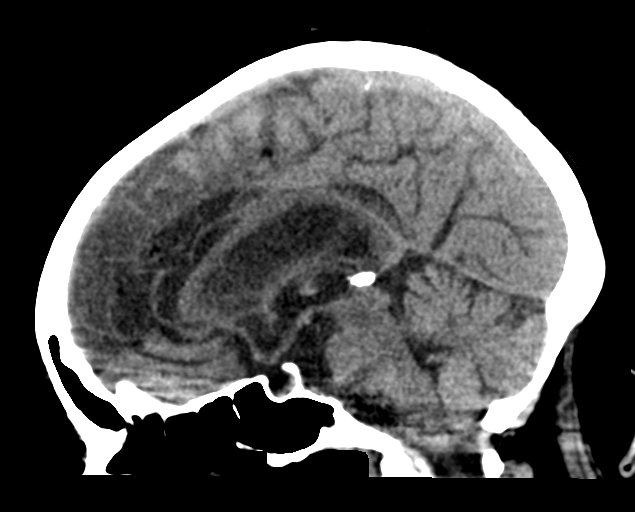
[im 37/56  brain]
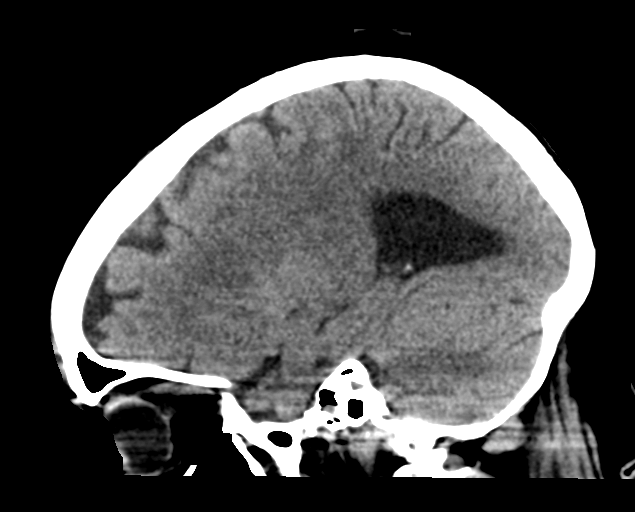

[16 of 47 positions shown; findings below may reference images not displayed]

FINDINGS: Brain: No acute territorial infarction, hemorrhage or intracranial
mass. Atrophy and small vessel ischemic changes of the white matter.
Stable ventricle size.

Vascular: Hyperdense vessels.  Carotid vascular calcification

Skull: Normal. Negative for fracture or focal lesion.

Sinuses/Orbits: No acute finding.

Other: Small left forehead laceration
IMPRESSION: 1. No CT evidence for acute intracranial abnormality.
2. Atrophy and small vessel ischemic changes of the white matter

## 2019-08-21 ENCOUNTER — Other Ambulatory Visit: Payer: Self-pay

## 2019-08-21 ENCOUNTER — Emergency Department
Admission: EM | Admit: 2019-08-21 | Discharge: 2019-08-21 | Disposition: A | Discharge status: Home / Self Care | Payer: Medicare Other | Attending: Student | Admitting: Student

## 2019-08-21 ENCOUNTER — Emergency Department: Payer: Medicare Other

## 2019-08-21 DIAGNOSIS — E876 Hypokalemia: Secondary | ICD-10-CM | POA: Diagnosis not present

## 2019-08-21 DIAGNOSIS — Z87891 Personal history of nicotine dependence: Secondary | ICD-10-CM | POA: Diagnosis not present

## 2019-08-21 DIAGNOSIS — M25552 Pain in left hip: Secondary | ICD-10-CM | POA: Diagnosis not present

## 2019-08-21 DIAGNOSIS — I10 Essential (primary) hypertension: Secondary | ICD-10-CM | POA: Diagnosis not present

## 2019-08-21 DIAGNOSIS — Z96652 Presence of left artificial knee joint: Secondary | ICD-10-CM | POA: Diagnosis not present

## 2019-08-21 DIAGNOSIS — Z96642 Presence of left artificial hip joint: Secondary | ICD-10-CM | POA: Diagnosis not present

## 2019-08-21 LAB — URINALYSIS, COMPLETE (UACMP) WITH MICROSCOPIC
Bacteria, UA: NONE SEEN
Bilirubin Urine: NEGATIVE
Glucose, UA: NEGATIVE mg/dL
Ketones, ur: NEGATIVE mg/dL
Leukocytes,Ua: NEGATIVE
Nitrite: NEGATIVE
Protein, ur: NEGATIVE mg/dL
Specific Gravity, Urine: 1.015 (ref 1.005–1.030)
pH: 7.5 (ref 5.0–8.0)

## 2019-08-21 LAB — CBC WITH DIFFERENTIAL/PLATELET
Abs Immature Granulocytes: 0.02 10*3/uL (ref 0.00–0.07)
Basophils Absolute: 0 10*3/uL (ref 0.0–0.1)
Basophils Relative: 0 %
Eosinophils Absolute: 0.2 10*3/uL (ref 0.0–0.5)
Eosinophils Relative: 4 %
HCT: 36.1 % (ref 36.0–46.0)
Hemoglobin: 12.7 g/dL (ref 12.0–15.0)
Immature Granulocytes: 0 %
Lymphocytes Relative: 20 %
Lymphs Abs: 1.1 10*3/uL (ref 0.7–4.0)
MCH: 30 pg (ref 26.0–34.0)
MCHC: 35.2 g/dL (ref 30.0–36.0)
MCV: 85.1 fL (ref 80.0–100.0)
Monocytes Absolute: 0.4 10*3/uL (ref 0.1–1.0)
Monocytes Relative: 7 %
Neutro Abs: 3.6 10*3/uL (ref 1.7–7.7)
Neutrophils Relative %: 69 %
Platelets: 207 10*3/uL (ref 150–400)
RBC: 4.24 MIL/uL (ref 3.87–5.11)
RDW: 12.9 % (ref 11.5–15.5)
WBC: 5.2 10*3/uL (ref 4.0–10.5)
nRBC: 0 % (ref 0.0–0.2)

## 2019-08-21 LAB — BASIC METABOLIC PANEL
Anion gap: 12 (ref 5–15)
BUN: 9 mg/dL (ref 8–23)
CO2: 25 mmol/L (ref 22–32)
Calcium: 9.1 mg/dL (ref 8.9–10.3)
Chloride: 106 mmol/L (ref 98–111)
Creatinine, Ser: 0.79 mg/dL (ref 0.44–1.00)
GFR calc Af Amer: >60 mL/min (ref >60)
GFR calc non Af Amer: >60 mL/min (ref >60)
Glucose, Bld: 112 mg/dL — ABNORMAL HIGH (ref 70–99)
Potassium: 2.3 mmol/L — CL (ref 3.5–5.1)
Sodium: 143 mmol/L (ref 135–145)

## 2019-08-21 MED ORDER — POTASSIUM CHLORIDE ER 10 MEQ PO TBCR: 20.0000 meq | EXTENDED_RELEASE_TABLET | Freq: Every day | ORAL | 0 refills | Status: DC

## 2019-08-21 MED ORDER — POTASSIUM CHLORIDE CRYS ER 20 MEQ PO TBCR
40.0000 meq | EXTENDED_RELEASE_TABLET | Freq: Once | ORAL | Status: AC
  Administered 2019-08-21: 40 meq via ORAL
  Filled 2019-08-21: qty 2

## 2019-08-21 MED ORDER — POTASSIUM CHLORIDE 10 MEQ/100ML IV SOLN
10.0000 meq | Freq: Once | INTRAVENOUS | Status: AC
  Administered 2019-08-21: 18:00:00 10 meq via INTRAVENOUS
  Filled 2019-08-21: qty 100

## 2019-08-21 MED ORDER — OXYCODONE HCL 5 MG PO TABS
5.0000 mg | ORAL_TABLET | Freq: Once | ORAL | Status: AC
  Administered 2019-08-21: 5 mg via ORAL
  Filled 2019-08-21: qty 1

## 2019-08-21 MED ORDER — SODIUM CHLORIDE 0.9 % IV BOLUS
250.0000 mL | Freq: Once | INTRAVENOUS | Status: AC
  Administered 2019-08-21: 250 mL via INTRAVENOUS

## 2019-08-21 MED ORDER — ONDANSETRON HCL 4 MG/2ML IJ SOLN
4.0000 mg | Freq: Once | INTRAMUSCULAR | Status: AC
  Administered 2019-08-21: 19:00:00 4 mg via INTRAVENOUS

## 2019-08-21 MED ORDER — ONDANSETRON HCL 4 MG/2ML IJ SOLN
INTRAMUSCULAR | Status: AC
  Filled 2019-08-21: qty 2

## 2019-08-21 MED ORDER — TRAMADOL HCL 50 MG PO TABS: 50.0000 mg | ORAL_TABLET | Freq: Four times a day (QID) | ORAL | 0 refills | Status: DC | PRN

## 2019-08-21 NOTE — Discharge Instructions (Signed)
Please call and schedule follow-up appointment with your primary care provider next week.  Return to the emergency department for symptoms of concern if unable to schedule appointment.  Use your walker to help you ambulate until your hip pain has improved.  You may also need to call and schedule a follow-up appointment with your orthopedic doctor.

## 2019-08-21 NOTE — ED Provider Notes (Signed)
[facility] Emergency Department Provider Note ____________________________________________   First MD Initiated Contact with Patient 08/21/19 1515     (approximate)  I have reviewed the triage vital signs and the nursing notes.   HISTORY  Chief Complaint Fall  HPI [name] is a 69 y.o. female with medical history as listed below presents to the emergency department for treatment and evaluation after sustaining 2 separate falls over the past few days.  First fall was 2 days ago.  She states that she was out feeding the dogs and was walking back to get in her golf cart and fell.  She is not sure what caused her to fall, but thinks she slipped. She did not hit her head.  Second fall was last night and again she is unsure what caused the fall. No head injury then either. She states that until the fall on Thursday she has been feeling normal.  She denies having any chest pain, shortness of breath, blurred vision, dizziness, change in medications.  Her main complaint today is left hip pain.  She has had a previous left hip replacement.  No alleviating measures have been attempted prior to arrival.         Past Medical History:  Diagnosis Date  . Abnormal EKG    PREOP EKG -NSR, CANNOT RULE OUT INFERIOR INFARCT AGE -INDETERMIINATE--FOLLOWED UP BY PT'S MEDICAL DOCTOR WITH ECHOCARDIOGRAM AND PT CLEARED FOR LEFT TOTAL KNEE ARTHROPLASTY SURGERY.  Marland Kitchen Anxiety   . Arthritis   . Depression   . Fibromyalgia   . GERD (gastroesophageal reflux disease)   . Glaucoma   . Hyperlipidemia   . Hypertension   . IBS (irritable bowel syndrome)   . Incontinence of urine   . PONV (postoperative nausea and vomiting)   . Shortness of breath    WITH EXERTION  . Umbilical hernia    NO PAIN    Patient Active Problem List   Diagnosis Date Noted  . Expected blood loss anemia 02/21/2012  . Obese 02/21/2012  . Hyponatremia 02/21/2012  . 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 ABDOMINAL HYSTERECTOMY  1990  . APPENDECTOMY  1955  . CHOLECYSTECTOMY  2007  . EYE SURGERY     LASER EYE SURGERY -LEFT FOR [place] GLAUCOMA  . JOINT REPLACEMENT     LEFT TOTAL HIP REPLACEMENT 1998,  RIGHT TOTAL KNEE REPLACEMENT OCT 2012  . LAPAROSCOPY TO REMOVE SCAR TISSUE  1991  . LEFT KNEE ARTHROSCOPY 2012    . RT SHOULDER ARTHROSCOPY  2011    . TONSILLECTOMY    . TOTAL KNEE ARTHROPLASTY  02/20/2012   Procedure: TOTAL KNEE ARTHROPLASTY;  Surgeon: Mauri Pole, MD;  Location: [facility];  Service: Orthopedics;  Laterality: Left;  . TRANSVAGINAL TAPE  2000    Prior to Admission medications   Medication Sig Start Date End Date Taking? Authorizing Provider  clonazePAM (KLONOPIN) 0.5 MG tablet Take 1 mg by mouth at bedtime.    Yes [provider]  potassium chloride (KLOR-CON) 10 MEQ tablet Take 2 tablets (20 mEq total) by mouth daily for 7 days. 08/21/19 08/28/19  Farzana Koci, Dessa Phi, FNP  traMADol (ULTRAM) 50 MG tablet Take 1 tablet (50 mg total) by mouth every 6 (six) hours as needed. 08/21/19   Nobuko Gsell, Johnette Abraham B, FNP    Allergies Erythromycin, Methocarbamol, and Sulfa antibiotics  Family History  Problem Relation Age of Onset  . Bone  cancer Mother   . Arthritis Mother   . Cancer Mother   . Parkinsonism Mother   . Arthritis Father   . Stroke Father   . Diabetes Father   . Breast cancer Daughter   . Stroke Sister   . Parkinsonism Brother   . Diabetes Brother     Social History Social History   Tobacco Use  . Smoking status: Former Smoker    Years: 5.00    Types: Cigarettes  . Smokeless tobacco: Never Used  . Tobacco comment: QUIT SMOKING 18 YRS AGO  1995  Substance Use Topics  . Alcohol use: No  . Drug use: No    Review of Systems  Constitutional: No fever/chills Eyes: No visual changes. ENT: No sore throat. Cardiovascular: Denies chest pain. Respiratory: Denies shortness of breath. Gastrointestinal: No abdominal  pain.  No nausea, no vomiting.  No diarrhea.  No constipation. Genitourinary: Negative for dysuria. Musculoskeletal: Positive for left hip pain. Skin: Negative for rash. Neurological: Negative for headaches, focal weakness or numbness. ____________________________________________   PHYSICAL EXAM:  VITAL SIGNS: ED Triage Vitals  Enc Vitals Group     BP 08/21/19 1502 (!) 178/90     Pulse Rate 08/21/19 1502 77     Resp 08/21/19 1502 18     Temp 08/21/19 1502 98.3 F (36.8 C)     Temp src --      SpO2 08/21/19 1502 100 %     Weight 08/21/19 1503 150 lb (68 kg)     Height 08/21/19 1503 5\' 2"  (1.575 m)     Head Circumference --      Peak Flow --      Pain Score 08/21/19 1502 9     Pain Loc --      Pain Edu? --      Excl. in GC? --     Constitutional: Alert and oriented. Well appearing and in no acute distress. Eyes: Conjunctivae are normal. PERRL. EOMI. Head: Atraumatic. Nose: No congestion/rhinnorhea. Mouth/Throat: Mucous membranes are moist.  Oropharynx non-erythematous. Neck: No stridor.   Hematological/Lymphatic/Immunilogical: No cervical lymphadenopathy. Cardiovascular: Normal rate, regular rhythm. Grossly normal heart sounds.  Good peripheral circulation. Respiratory: Normal respiratory effort.  No retractions. Lungs CTAB. Gastrointestinal: Soft and nontender. No distention. No abdominal bruits. No CVA tenderness. Genitourinary:  Musculoskeletal: Left hip pain increases with knee flexion and external rotation.  She has no focal midline tenderness along the length of the spine.  She is able to perform range of motion of the upper extremities as well as the right lower extremity without expressing pain. Neurologic:  Normal speech and language. No gross focal neurologic deficits are appreciated. No gait instability. Skin:  Skin is warm, dry and intact. No rash noted. Psychiatric: Mood and affect are normal. Speech and behavior are  normal.  ____________________________________________   LABS (all labs ordered are listed, but only abnormal results are displayed)  Labs Reviewed  BASIC METABOLIC PANEL - Abnormal; Notable for the following components:      Result Value   Potassium 2.3 (*)    Glucose, Bld 112 (*)    All other components within normal limits  URINALYSIS, COMPLETE (UACMP) WITH MICROSCOPIC - Abnormal; Notable for the following components:   Hgb urine dipstick TRACE (*)    All other components within normal limits  CBC WITH DIFFERENTIAL/PLATELET   ____________________________________________  EKG  ED ECG REPORT I, Adian Jablonowski, FNP-BC personally viewed and interpreted this ECG.   Date: 08/21/2019  EKG Time: 1514  Rate: 71  Rhythm: normal sinus rhythm  Axis: normal  Intervals:none  ST&T Change: no ST elevation  ____________________________________________  RADIOLOGY  ED MD interpretation:    X-ray of the left hip is negative for any acute findings.  I, Kem Boroughs, personally viewed and evaluated these images (plain radiographs) as part of my medical decision making, as well as reviewing the written report by the radiologist.  CT of the left hip shows 4 mm of lucency around the lateral proximal femoral stem and lesser trochanter no periprosthetic fractures  Official radiology report(s): CT Hip Left Wo Contrast  Result Date: 08/21/2019 CLINICAL DATA:  Hip pain, question of stress fracture EXAM: CT OF THE LEFT HIP WITHOUT CONTRAST TECHNIQUE: Multidetector CT imaging of the left hip was performed according to the standard protocol. Multiplanar CT image reconstructions were also generated. COMPARISON:  Radiograph same day, CT July 24, 2013 FINDINGS: Bones/Joint/Cartilage The patient is status post left total hip arthroplasty. There is subtle periprosthetic lucency seen around the proximal femoral stem in comparison to CT dating back to 2015. There is 4 mm of lucency seen around the medial  proximal femoral stem, series 4, image 40. There is 4 mm of lucency seen around the lateral proximal femoral stem, series 4, image 39. there also appears to be an area of rounded lucency seen within the lesser trochanter best seen on series 2, image 60. No definite fracture is seen. The femoral head is still well seated within the acetabular component. There is diffuse osteopenia. Scattered heterotopic ossifications seen around the mediolateral aspect of the femoral stem. Ligaments Suboptimally assessed by CT. Muscles and Tendons Mild fatty atrophy noted within the muscles surrounding the hip. The visualized portion the tendons appear to be grossly intact. Soft tissues The deep pelvis is grossly unremarkable. There is mild soft tissue stranding seen over the lateral aspect of the hip. No soft tissue hematoma seen. IMPRESSION: Status post left total hip arthroplasty with findings which could be suggestive of loosening and osteolysis in comparison to prior CT dating back to 2015. No periprosthetic fracture is seen. Electronically Signed   By: Jonna Clark M.D.   On: 08/21/2019 19:44   DG Hip Unilat W or Wo Pelvis 2-3 Views Left  Result Date: 08/21/2019 CLINICAL DATA:  69 year old female with fall and left hip pain. EXAM: DG HIP (WITH OR WITHOUT PELVIS) 2-3V LEFT COMPARISON:  CT abdomen pelvis dated 07/24/2013. FINDINGS: There is a total left hip arthroplasty. The arthroplasty components appear intact and in anatomic alignment. There is no acute fracture or dislocation. The bones are osteopenic. Degenerative changes of the lower lumbar spine. IMPRESSION: No acute fracture or dislocation. Electronically Signed   By: Elgie Collard M.D.   On: 08/21/2019 16:20    ____________________________________________   PROCEDURES  Procedure(s) performed (including Critical Care):  Procedures  ____________________________________________   INITIAL IMPRESSION / ASSESSMENT AND PLAN     69 year old female  presenting to the emergency department after 2 falls over the past 2 days.  See HPI for further details.  Exam is overall reassuring with the exception of pain in the left hip.  Plan will be to do some baseline labs, EKG, and x-ray of the left hip.  DIFFERENTIAL DIAGNOSIS  Left hip fracture, femur fracture, electrolyte imbalance, EKG abnormality.  ED COURSE  Labs show hypokalemia at 2.3.  Potassium replacement provided with 10 mEq of potassium IV and oral potassium of 40 mEq. She is to have repeat labs at her PCP in about  a week. She will also use her walker until she is more stable and her hip pain has improved. She plans to call [facility] department and discuss resources available for assistance with chores around the home. While here, she was able to bear weight, but had significant pain. Tramadol will be prescribed. CT of the hip showed loosening of the hardware, but no fracture. She feels that she is ready for discharge. Her son is at bedside.  ____________________________________________   FINAL CLINICAL IMPRESSION(S) / ED DIAGNOSES  Final diagnoses:  Hypokalemia  Acute hip pain, left     ED Discharge Orders         Ordered    potassium chloride (KLOR-CON) 10 MEQ tablet  Daily     08/21/19 2028    traMADol (ULTRAM) 50 MG tablet  Every 6 hours PRN     08/21/19 2030           [name] was evaluated in Emergency Department on 08/21/2019 for the symptoms described in the history of present illness. She was evaluated in the context of the global COVID-19 pandemic, which necessitated consideration that the patient might be at risk for infection with the SARS-CoV-2 virus that causes COVID-19. Institutional protocols and algorithms that pertain to the evaluation of patients at risk for COVID-19 are in a state of rapid change based on information released by regulatory bodies including the CDC and federal and state organizations. These policies and algorithms were  followed during the patient's care in the ED.   Note:  This document was prepared using Dragon voice recognition software and may include unintentional dictation errors.   Chinita Pester, FNP 08/21/19 2041    Miguel Aschoff., MD 08/22/19 1122

## 2019-08-21 NOTE — ED Notes (Signed)
Date and time results received: 08/21/19 5:13 PM  Test: potassium Critical Value: 2.3  Name of Provider Notified: Trisha Mangle, NP  Orders Received? Or Actions Taken?: Orders Received - See Orders for details

## 2019-08-21 NOTE — ED Triage Notes (Signed)
Pt comes via POV from home with c/o fall this morning. Pt states she was outside feeding the dogs and was headed back to golf cart when she slipped and fell.  Pt states pain in left hip area. Pt states she is able to bear weight but it hurts bad.

## 2019-10-14 ENCOUNTER — Other Ambulatory Visit: Payer: Self-pay

## 2019-10-14 ENCOUNTER — Ambulatory Visit
Admission: EM | Admit: 2019-10-14 | Discharge: 2019-10-14 | Disposition: A | Discharge status: Home / Self Care | Payer: Medicare Other | Attending: Emergency Medicine | Admitting: Emergency Medicine

## 2019-10-14 ENCOUNTER — Encounter: Payer: Self-pay | Admitting: Emergency Medicine

## 2019-10-14 DIAGNOSIS — M545 Low back pain, unspecified: Secondary | ICD-10-CM

## 2019-10-14 LAB — POCT URINALYSIS DIP (MANUAL ENTRY)
Bilirubin, UA: NEGATIVE
Blood, UA: NEGATIVE
Glucose, UA: NEGATIVE mg/dL
Ketones, POC UA: NEGATIVE mg/dL
Leukocytes, UA: NEGATIVE
Nitrite, UA: NEGATIVE
Protein Ur, POC: NEGATIVE mg/dL
Spec Grav, UA: 1.02 (ref 1.010–1.025)
Urobilinogen, UA: 0.2 E.U./dL
pH, UA: 7 (ref 5.0–8.0)

## 2019-10-14 MED ORDER — KETOROLAC TROMETHAMINE 30 MG/ML IJ SOLN
30.0000 mg | Freq: Once | INTRAMUSCULAR | Status: AC
  Administered 2019-10-14: 30 mg via INTRAMUSCULAR

## 2019-10-14 NOTE — Discharge Instructions (Signed)
You were given an injection of Toradol for your back pain.    Follow up with your primary care provider or an orthopedist if your symptoms are not improving.

## 2019-10-14 NOTE — ED Provider Notes (Signed)
[name]    CSN: [id number] Arrival date & time: 10/14/19  1003      History   Chief Complaint Chief Complaint  Patient presents with   Back Pain    HPI [name] is a 69 y.o. female.   Accompanied by her son, patient presents with right lower back pain that started yesterday evening.  She denies falls or injury.  She states the pain is constant, 10/10, no aggravating or alleviating factors.  She states she was treated with ibuprofen and Biofreeze at the assisted living yesterday evening which did not relieve her symptoms.  She denies numbness, weakness, paresthesias, abdominal pain, dysuria, pelvic pain, or other symptoms.  She was recently treated for a UTI earlier this month.  The history is provided by the patient and a relative.    Past Medical History:  Diagnosis Date   Abnormal EKG    PREOP EKG -NSR, CANNOT RULE OUT INFERIOR INFARCT AGE -INDETERMIINATE--FOLLOWED UP BY PT'S MEDICAL DOCTOR WITH ECHOCARDIOGRAM AND PT CLEARED FOR LEFT TOTAL KNEE ARTHROPLASTY SURGERY.   Anxiety    Arthritis    Depression    Fibromyalgia    GERD (gastroesophageal reflux disease)    Glaucoma    Hyperlipidemia    Hypertension    IBS (irritable bowel syndrome)    Incontinence of urine    PONV (postoperative nausea and vomiting)    Shortness of breath    WITH EXERTION   Umbilical hernia    NO PAIN    Patient Active Problem List   Diagnosis Date Noted   Expected blood loss anemia 02/21/2012   Obese 02/21/2012   Hyponatremia 02/21/2012   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/2012    Past Surgical History:  Procedure Laterality Date   [place]  2007   EYE SURGERY     LASER EYE SURGERY -LEFT FOR [place] REPLACEMENT     LEFT TOTAL HIP REPLACEMENT 1998,  RIGHT TOTAL KNEE REPLACEMENT OCT 2012   LAPAROSCOPY TO REMOVE SCAR TISSUE  1991   LEFT KNEE ARTHROSCOPY 2012     RT SHOULDER  ARTHROSCOPY  2011     TONSILLECTOMY     TOTAL KNEE ARTHROPLASTY  02/20/2012   Procedure: TOTAL KNEE ARTHROPLASTY;  Surgeon: Mauri Pole, MD;  Location: [facility];  Service: Orthopedics;  Laterality: Left;   TRANSVAGINAL TAPE  2000    OB History   No obstetric history on file.      Home Medications    Prior to Admission medications   Medication Sig Start Date End Date Taking? Authorizing Provider  baclofen (LIORESAL) 10 MG tablet Take 5 mg by mouth every 8 (eight) hours as needed (back pain).   Yes [provider]  clonazePAM (KLONOPIN) 0.5 MG tablet Take 1 mg by mouth at bedtime.    Yes [provider]  docusate sodium (COLACE) 100 MG capsule Take 100 mg by mouth daily.   Yes [provider]  fluticasone (FLONASE) 50 MCG/ACT nasal spray Place 1 spray into both nostrils daily as needed for allergies or rhinitis.   Yes [provider]  ibuprofen (ADVIL) 400 MG tablet Take 400 mg by mouth every 6 (six) hours as needed (pain).   Yes [provider]  loratadine (CLARITIN) 10 MG tablet Take 10 mg by mouth daily.   Yes [provider]  pravastatin (PRAVACHOL) 80 MG tablet Take 80 mg by mouth daily.  Yes [provider]  estradiol (VIVELLE-DOT) 0.1 MG/24HR patch Place 1 patch onto the skin 2 (two) times a week. 10/13/19   [provider]  omeprazole (PRILOSEC) 20 MG capsule Take 20 mg by mouth daily. 10/12/19   [provider]  potassium chloride (KLOR-CON) 10 MEQ tablet Take 2 tablets (20 mEq total) by mouth daily for 7 days. 08/21/19 08/28/19  Triplett, Rulon Eisenmenger B, FNP  potassium chloride SA (KLOR-CON) 20 MEQ tablet Take 20 mEq by mouth 2 (two) times daily. 10/12/19   [provider]  traMADol (ULTRAM) 50 MG tablet Take 1 tablet (50 mg total) by mouth every 6 (six) hours as needed. 08/21/19   Chinita Pester, FNP    Family History Family History  Problem Relation Age of Onset   Bone cancer Mother     Arthritis Mother    Cancer Mother    Parkinsonism Mother    Arthritis Father    Stroke Father    Diabetes Father    Breast cancer Daughter    Stroke Sister    Parkinsonism Brother    Diabetes Brother     Social History Social History   Tobacco Use   Smoking status: Former Smoker    Years: 5.00    Types: Cigarettes   Smokeless tobacco: Never Used   Tobacco comment: QUIT SMOKING 18 YRS AGO  1995  Vaping Use   Vaping Use: Never used  Substance Use Topics   Alcohol use: No   Drug use: No     Allergies   Erythromycin, Methocarbamol, and Sulfa antibiotics   Review of Systems Review of Systems  Constitutional: Negative for chills and fever.  HENT: Negative for ear pain and sore throat.   Eyes: Negative for pain and visual disturbance.  Respiratory: Negative for cough and shortness of breath.   Cardiovascular: Negative for chest pain and palpitations.  Gastrointestinal: Negative for abdominal pain and vomiting.  Genitourinary: Negative for dysuria and hematuria.  Musculoskeletal: Positive for back pain. Negative for arthralgias.  Skin: Negative for color change and rash.  Neurological: Negative for seizures, syncope, weakness and numbness.  All other systems reviewed and are negative.    Physical Exam Triage Vital Signs ED Triage Vitals  Enc Vitals Group     BP 10/14/19 1006 (!) 155/83     Pulse Rate 10/14/19 1006 79     Resp 10/14/19 1006 18     Temp 10/14/19 1006 98.6 F (37 C)     Temp Source 10/14/19 1006 Oral     SpO2 10/14/19 1006 98 %     Weight --      Height --      Head Circumference --      Peak Flow --      Pain Score 10/14/19 1007 10     Pain Loc --      Pain Edu? --      Excl. in GC? --    No data found.  Updated Vital Signs BP (!) 155/83 (BP Location: Left Arm)    Pulse 79    Temp 98.6 F (37 C) (Oral)    Resp 18    Ht 5\' 2"  (1.575 m)    Wt 150 lb (68 kg)    SpO2 98%    BMI 27.44 kg/m   Visual Acuity Right Eye  Distance:   Left Eye Distance:   Bilateral Distance:    Right Eye Near:   Left Eye Near:    Bilateral Near:  Physical Exam Vitals and nursing note reviewed.  Constitutional:      General: She is not in acute distress.    Appearance: She is well-developed. She is not ill-appearing.  HENT:     Head: Normocephalic and atraumatic.     Mouth/Throat:     Mouth: Mucous membranes are moist.     Pharynx: Oropharynx is clear.  Eyes:     Conjunctiva/sclera: Conjunctivae normal.  Cardiovascular:     Rate and Rhythm: Normal rate and regular rhythm.     Heart sounds: No murmur heard.   Pulmonary:     Effort: Pulmonary effort is normal. No respiratory distress.     Breath sounds: Normal breath sounds.  Abdominal:     Palpations: Abdomen is soft.     Tenderness: There is no abdominal tenderness. There is no guarding or rebound.  Musculoskeletal:        General: No swelling, tenderness, deformity or signs of injury.     Cervical back: Neck supple.  Skin:    General: Skin is warm and dry.     Findings: Rash present. No bruising, erythema or lesion.     Comments: Scattered nonblanching nontender red macular rash along lower spine in pattern that appears to be from Biofreeze patch. No drainage or open lesions.   Neurological:     General: No focal deficit present.     Mental Status: She is alert and oriented to person, place, and time.     Sensory: No sensory deficit.     Motor: No weakness.     Gait: Gait abnormal.     Comments: Ambulatory with walker.   Psychiatric:        Mood and Affect: Mood normal.        Behavior: Behavior normal.      UC Treatments / Results  Labs (all labs ordered are listed, but only abnormal results are displayed) Labs Reviewed  POCT URINALYSIS DIP (MANUAL ENTRY)    EKG   Radiology No results found.  Procedures Procedures (including critical care time)  Medications Ordered in UC Medications  ketorolac (TORADOL) 30 MG/ML injection 30 mg  (30 mg Intramuscular Given 10/14/19 1030)    Initial Impression / Assessment and Plan / UC Course  I have reviewed the triage vital signs and the nursing notes.  Pertinent labs & imaging results that were available during my care of the patient were reviewed by me and considered in my medical decision making (see chart for details).   Acute right lower back pain without sciatica.  No evidence of infection in urine.  Treating discomfort with Toradol.  Instructed patient to follow-up with her PCP or an orthopedist if her symptoms are not improving.  Patient and her son agree to plan of care.    Final Clinical Impressions(s) / UC Diagnoses   Final diagnoses:  Acute right-sided low back pain without sciatica     Discharge Instructions     You were given an injection of Toradol for your back pain.    Follow up with your primary care provider or an orthopedist if your symptoms are not improving.       ED Prescriptions    None     I have reviewed the PDMP during this encounter.   [name] Bail, NP 10/14/19 1041

## 2019-10-14 NOTE — ED Triage Notes (Signed)
Patient in today c/o back pain that started last night. Patient denies any injury. Patient denies urinary frequency or dysuria. Patient denies fever. Patient took Ibuprofen last night.

## 2020-06-21 IMAGING — CR DG HIP (WITH OR WITHOUT PELVIS) 2-3V*L*
1 series · 3 of 3 positions shown · non-contrast
Comparison: CT abdomen pelvis dated 07/24/2013.

CLINICAL DATA: 68-year-old female with fall and left hip pain.

EXAM:
DG HIP (WITH OR WITHOUT PELVIS) 2-3V LEFT

[Series 1: dg hip unilat w or w/o pelvis 2-3 views  · non-contrast · 0.14mm/px · 3 of 3 slices shown]
[im 1/3]
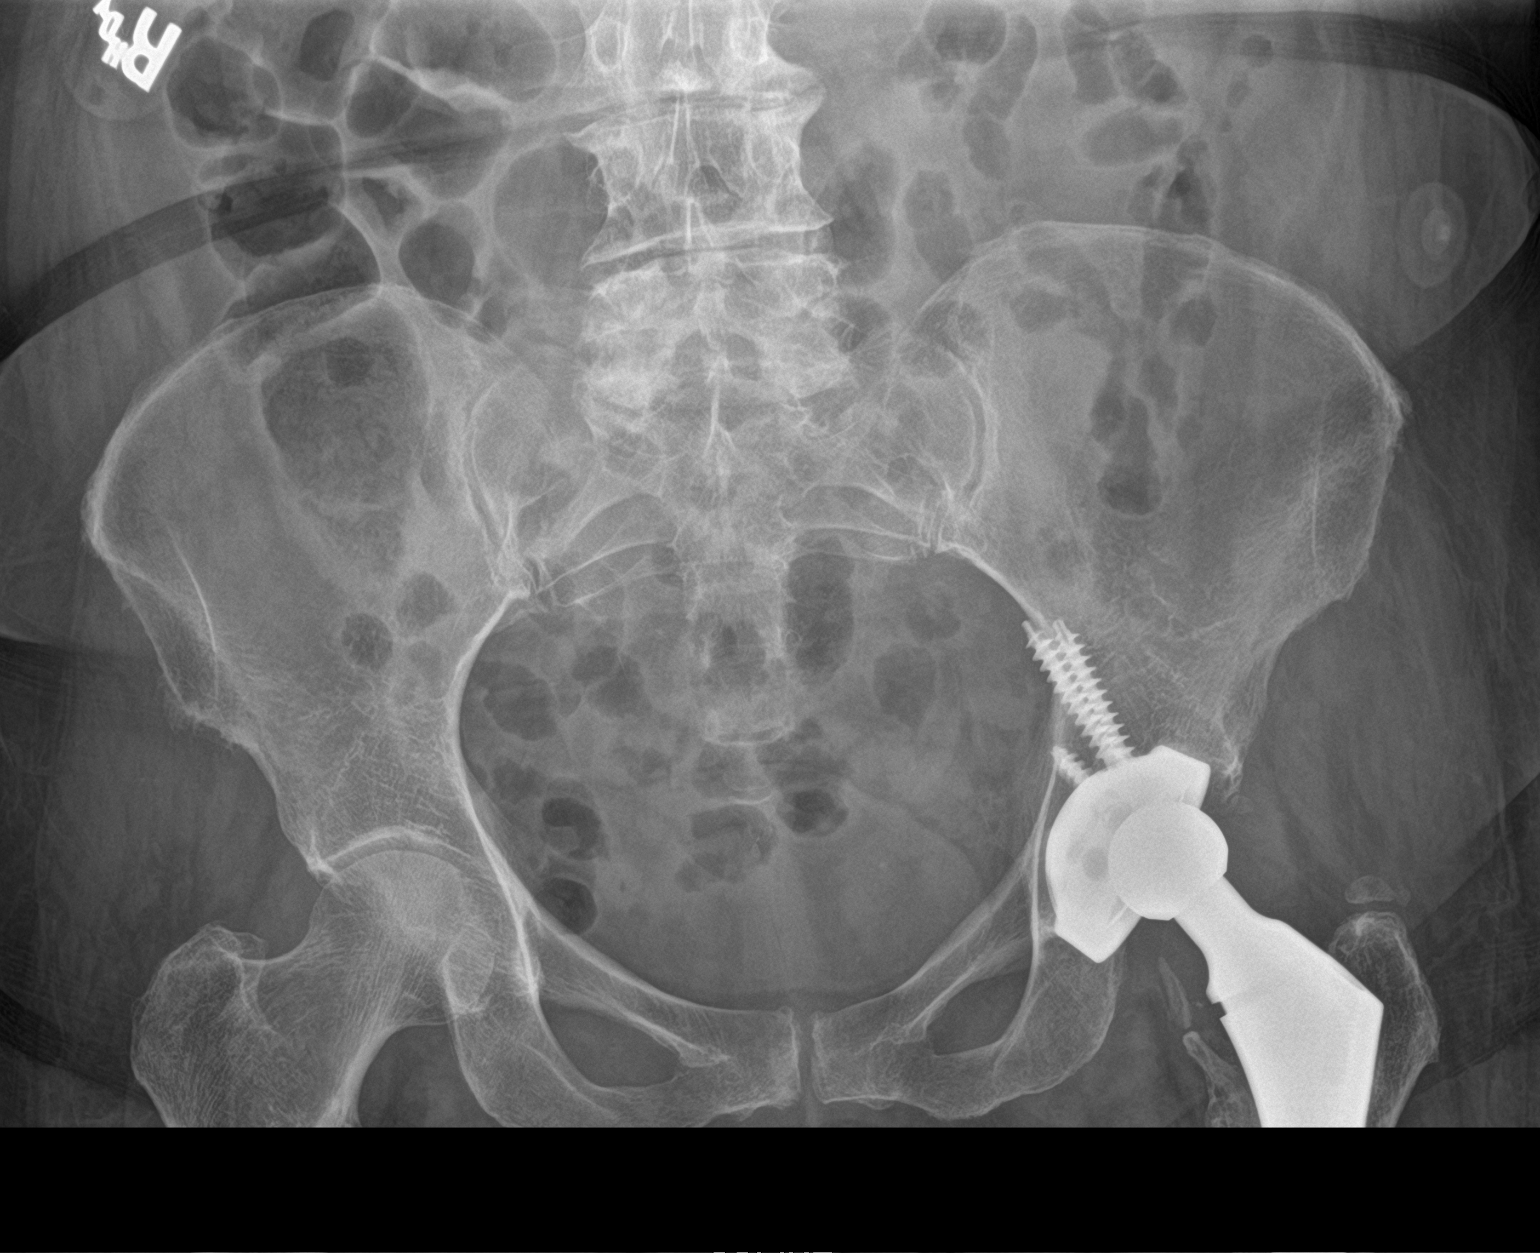
[im 2/3]
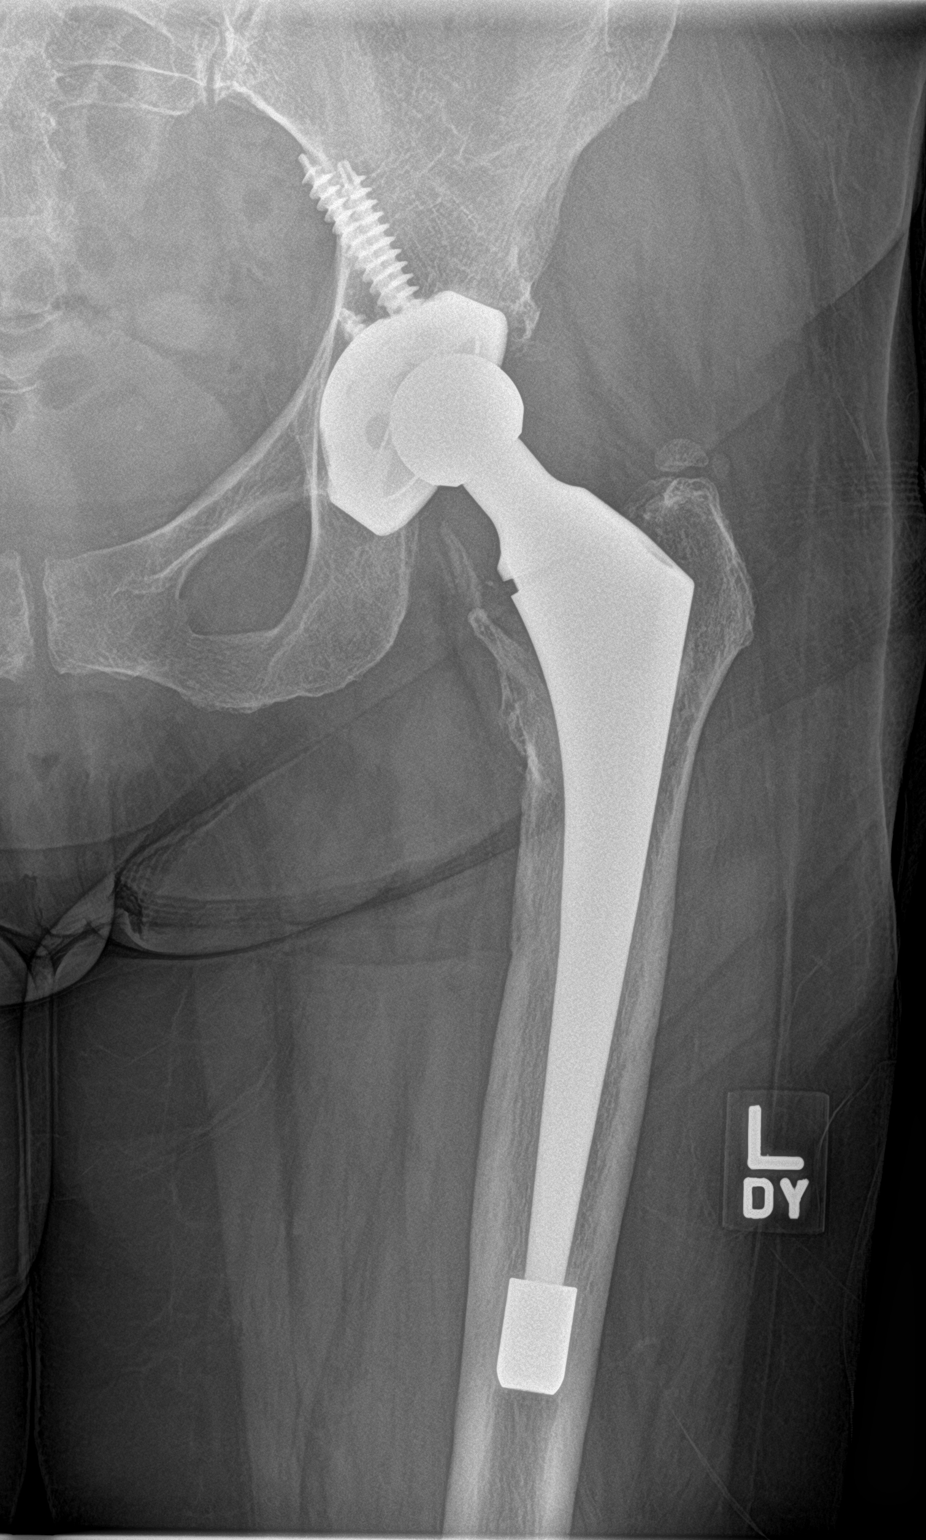
[im 3/3]
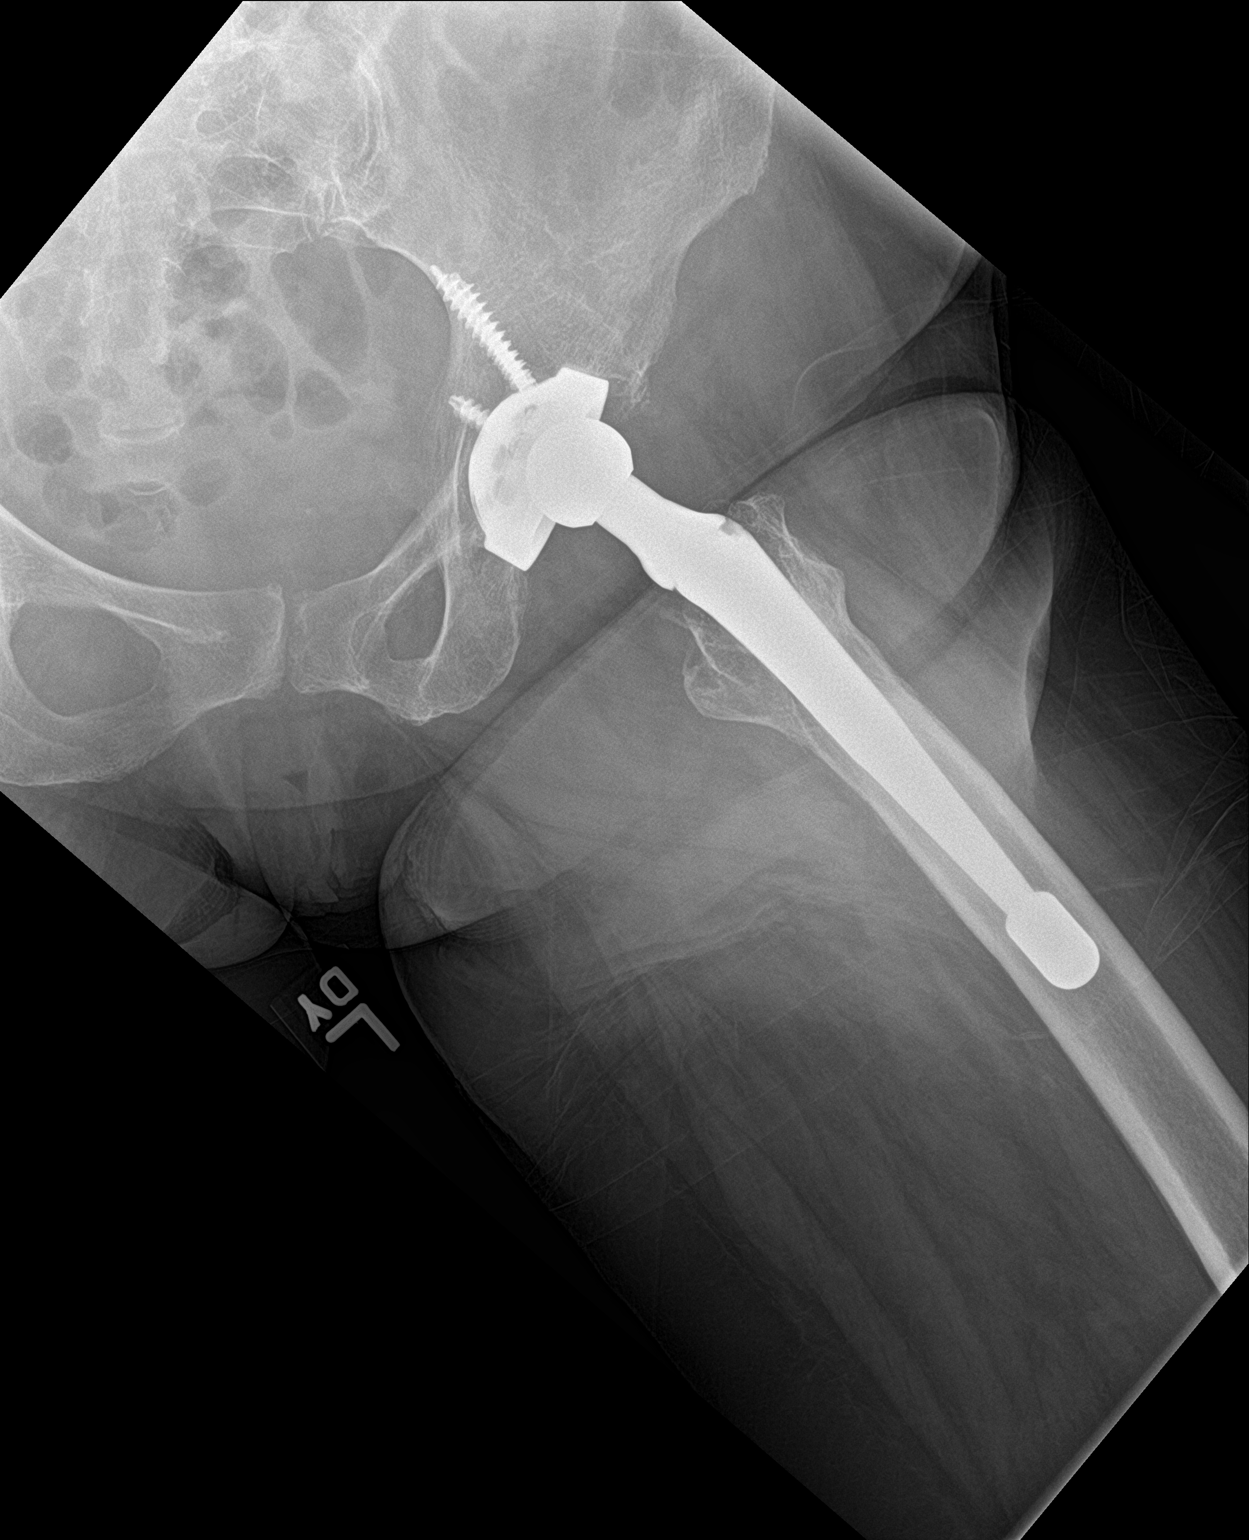

[3 of 3 positions shown; findings below may reference images not displayed]

FINDINGS: There is a total left hip arthroplasty. The arthroplasty components
appear intact and in anatomic alignment. There is no acute fracture
or dislocation. The bones are osteopenic. Degenerative changes of
the lower lumbar spine.
IMPRESSION: No acute fracture or dislocation.

## 2022-01-02 ENCOUNTER — Encounter: Payer: Self-pay | Admitting: Emergency Medicine

## 2022-01-02 ENCOUNTER — Other Ambulatory Visit: Payer: Self-pay

## 2022-01-02 ENCOUNTER — Emergency Department: Payer: Medicare Other

## 2022-01-02 ENCOUNTER — Emergency Department
Admission: EM | Admit: 2022-01-02 | Discharge: 2022-01-02 | Disposition: A | Discharge status: Home / Self Care | Payer: Medicare Other | Attending: Emergency Medicine | Admitting: Emergency Medicine

## 2022-01-02 DIAGNOSIS — S0093XA Contusion of unspecified part of head, initial encounter: Secondary | ICD-10-CM | POA: Insufficient documentation

## 2022-01-02 DIAGNOSIS — Z23 Encounter for immunization: Secondary | ICD-10-CM | POA: Insufficient documentation

## 2022-01-02 DIAGNOSIS — Z20822 Contact with and (suspected) exposure to covid-19: Secondary | ICD-10-CM | POA: Diagnosis not present

## 2022-01-02 DIAGNOSIS — W010XXA Fall on same level from slipping, tripping and stumbling without subsequent striking against object, initial encounter: Secondary | ICD-10-CM | POA: Insufficient documentation

## 2022-01-02 DIAGNOSIS — S0081XA Abrasion of other part of head, initial encounter: Secondary | ICD-10-CM | POA: Diagnosis not present

## 2022-01-02 DIAGNOSIS — W19XXXA Unspecified fall, initial encounter: Secondary | ICD-10-CM

## 2022-01-02 DIAGNOSIS — S0990XA Unspecified injury of head, initial encounter: Secondary | ICD-10-CM | POA: Diagnosis present

## 2022-01-02 DIAGNOSIS — Y92199 Unspecified place in other specified residential institution as the place of occurrence of the external cause: Secondary | ICD-10-CM | POA: Insufficient documentation

## 2022-01-02 LAB — SARS CORONAVIRUS 2 BY RT PCR: SARS Coronavirus 2 by RT PCR: NEGATIVE

## 2022-01-02 MED ORDER — TETANUS-DIPHTH-ACELL PERTUSSIS 5-2.5-18.5 LF-MCG/0.5 IM SUSY
0.5000 mL | PREFILLED_SYRINGE | Freq: Once | INTRAMUSCULAR | Status: AC
  Administered 2022-01-02: 0.5 mL via INTRAMUSCULAR
  Filled 2022-01-02: qty 0.5

## 2022-01-02 NOTE — ED Triage Notes (Signed)
Patient to ED via [facility] from [facility] for an unwitnessed mechanical hall. Patient states she tripped falling on the back of her head. Patient denies LOC or blood thinners. AOx4

## 2022-01-02 NOTE — ED Provider Triage Note (Signed)
Emergency Medicine Provider Triage Evaluation Note  [name] , a 71 y.o. female  was evaluated in triage.  Pt complains of fall, no LOC.  Review of Systems  Positive: Follow Negative: LOC  Physical Exam  BP (!) 130/58 (BP Location: Left Arm)   Pulse 90   Temp 98.8 F (37.1 C) (Oral)   Resp 18   Ht 5\' 2"  (1.575 m)   Wt 82.8 kg   SpO2 91%   BMI 33.40 kg/m  Gen:   Awake, no distress   Resp:  Normal effort  MSK:   Moves extremities without difficulty  Other:    Medical Decision Making  Medically screening exam initiated at 12:45 PM.  Appropriate orders placed.  [name] was informed that the remainder of the evaluation will be completed by another provider, this initial triage assessment does not replace that evaluation, and the importance of remaining in the ED until their evaluation is complete.  Patient did hit her head, ordered CT of the head and C-spine   [name], PA-C 01/02/22 1246

## 2022-01-02 NOTE — ED Triage Notes (Signed)
FIRST RN: Pt from [facility] assisted living via [facility] with reports of unwitnessed fall. Pt denies LOC. Pt reports she tripped. Has hematoma to back of head. A/O x 4.   94HR 133/67 96RA 121CBG

## 2022-01-02 NOTE — Discharge Instructions (Addendum)
Your COVID test is in process.  We noted a little bit of incidentally low oxygen level at 92-93%..  We will call her if it is positive.  She declined any other chest x-ray or other work-up given she denied any shortness of breath or chest pain however she understands that if her oxygen levels going below 90% or she is feeling short of breath that she needs to return to the ER for repeat evaluation.  Her CT imaging was reassuring

## 2022-01-02 NOTE — ED Provider Notes (Signed)
[facility] Provider Note    Event Date/Time   First MD Initiated Contact with Patient 01/02/22 1339     (approximate)   History   Fall   HPI  [name] is a 71 y.o. female who comes in from [place] [facility] due to unwitnessed fall.  Patient denies any LOC.  Reports that she tripped and fell backward landing on the back of her head.  She has a small scrape on her left cheek but denies any facial pain.  She denies any other injuries has been ambulatory since that with her walker.  Patient presents with her son and they deny any concerns for increasing weakness not eating.  She denies any dysuria, chest pain, shortness of breath or other concerns.  Physical Exam   Triage Vital Signs: ED Triage Vitals [01/02/22 1240]  Enc Vitals Group     BP (!) 130/58     Pulse Rate 90     Resp 18     Temp 98.8 F (37.1 C)     Temp Source Oral     SpO2 91 %     Weight 182 lb 9.6 oz (82.8 kg)     Height 5\' 2"  (1.575 m)     Head Circumference      Peak Flow      Pain Score 0     Pain Loc      Pain Edu?      Excl. in GC?     Most recent vital signs: Vitals:   01/02/22 1240  BP: (!) 130/58  Pulse: 90  Resp: 18  Temp: 98.8 F (37.1 C)  SpO2: 91%     General: Awake, no distress.  CV:  Good peripheral perfusion.  Resp:  Normal effort.  Abd:  No distention.  Other:  Equal strength in both legs.  Able to lift both legs up off the bed.  Equal strength in both arms.  Full range of motion without any pain.  She has no chest wall pain, abdominal pain.  No CTL spine tenderness.  She has a small abrasion on her left cheek without any facial tenderness.  Pupils are reactive bilaterally and extraocular movements are intact.  She has a abrasion/hematoma to the back of the head but no open laceration.   ED Results / Procedures / Treatments   Labs (all labs ordered are listed, but only abnormal results are displayed) Labs Reviewed - No data to  display  RADIOLOGY I have reviewed the CT head personally interpreted no evidence of intracranial hemorrhage  PROCEDURES:  Critical Care performed: No  Procedures   MEDICATIONS ORDERED IN ED: Medications - No data to display   IMPRESSION / MDM / ASSESSMENT AND PLAN / ED COURSE  I reviewed the triage vital signs and the nursing notes.   Patient's presentation is most consistent with acute presentation with potential threat to life or bodily function.  Differential includes intracranial hemorrhage, cervical fracture from mechanical fall.  She denies any chest pain, shortness of breath, UTI symptoms.  I reviewed a note from 7/1 /2023 where she was seen by orthopedics for low back pain.  CT head and neck are negative and similar to her prior.  Discussed with family about getting blood work, EKG if there are any other concerns but given no other symptoms and it was mechanical fall they were okay with holding off.  Patient's oxygen level was noted to be 91% in triage.  She is adamant  that she does not have any shortness of breath or chest pain.  I do not see any prior checks in the past 2 years to know if this is around her baseline or not.  Back in 2021 she was 96%.  Does report a remote history of smoking.  We discussed chest x-ray to evaluate for any pneumonia, edema but she declined.  She is got no swelling her legs.  No calf tenderness.  No wheezing noted.  She denies feeling short of breath.  She is willing to at least do a COVID test but does not want to have a chest x-ray done.  She denies symptoms from this and is at a facility where they can monitor her oxygen levels.  This time however she does not want any additional work-up of this and is preferring discharge home.  We will call her back if her COVID test is positive but at this time she is requesting discharge.  When I evaluated patient myself her oxygen level was 93% and she would intermittently go up to 97% with taking deep  breaths.     FINAL CLINICAL IMPRESSION(S) / ED DIAGNOSES   Final diagnoses:  Fall, initial encounter     Rx / DC Orders   ED Discharge Orders     None        Note:  This document was prepared using Dragon voice recognition software and may include unintentional dictation errors.   Concha Se, MD 01/02/22 [phone]

## 2022-01-02 NOTE — ED Notes (Signed)
See triage note.  Patient says she tripped and fell at [employer] and hit back of head.  Says she was awake the whole time.

## 2022-10-05 ENCOUNTER — Emergency Department
Admission: EM | Admit: 2022-10-05 | Discharge: 2022-10-06 | Disposition: A | Discharge status: Home / Self Care | Payer: Medicare Other | Attending: Emergency Medicine | Admitting: Emergency Medicine

## 2022-10-05 ENCOUNTER — Encounter: Payer: Self-pay | Admitting: Emergency Medicine

## 2022-10-05 DIAGNOSIS — R112 Nausea with vomiting, unspecified: Secondary | ICD-10-CM | POA: Diagnosis present

## 2022-10-05 DIAGNOSIS — E119 Type 2 diabetes mellitus without complications: Secondary | ICD-10-CM | POA: Insufficient documentation

## 2022-10-05 DIAGNOSIS — I10 Essential (primary) hypertension: Secondary | ICD-10-CM | POA: Insufficient documentation

## 2022-10-05 LAB — COMPREHENSIVE METABOLIC PANEL
ALT: 20 U/L (ref 0–44)
AST: 26 U/L (ref 15–41)
Albumin: 4 g/dL (ref 3.5–5.0)
Alkaline Phosphatase: 71 U/L (ref 38–126)
Anion gap: 10 (ref 5–15)
BUN: 17 mg/dL (ref 8–23)
CO2: 20 mmol/L — ABNORMAL LOW (ref 22–32)
Calcium: 8.7 mg/dL — ABNORMAL LOW (ref 8.9–10.3)
Chloride: 106 mmol/L (ref 98–111)
Creatinine, Ser: 0.71 mg/dL (ref 0.44–1.00)
GFR, Estimated: >60 mL/min (ref >60)
Glucose, Bld: 93 mg/dL (ref 70–99)
Potassium: 3.5 mmol/L (ref 3.5–5.1)
Sodium: 136 mmol/L (ref 135–145)
Total Bilirubin: 0.7 mg/dL (ref 0.3–1.2)
Total Protein: 6.7 g/dL (ref 6.5–8.1)

## 2022-10-05 LAB — LIPASE, BLOOD: Lipase: 60 U/L — ABNORMAL HIGH (ref 11–51)

## 2022-10-05 LAB — CBC
HCT: 38.1 % (ref 36.0–46.0)
Hemoglobin: 12.3 g/dL (ref 12.0–15.0)
MCH: 28.5 pg (ref 26.0–34.0)
MCHC: 32.3 g/dL (ref 30.0–36.0)
MCV: 88.4 fL (ref 80.0–100.0)
Platelets: 193 10*3/uL (ref 150–400)
RBC: 4.31 MIL/uL (ref 3.87–5.11)
RDW: 13.5 % (ref 11.5–15.5)
WBC: 6.5 10*3/uL (ref 4.0–10.5)
nRBC: 0 % (ref 0.0–0.2)

## 2022-10-05 NOTE — ED Triage Notes (Signed)
Pt presents ambulatory to triage via POV from [facility] with complaints of emesis x 3 weeks. Per family, the patient had an US performed which showed she had an enlarged spleen and they were unable to visualize her gallbladder. Unable to obtain report from [facility]. Pt denies pain but has sudden waves of nausea. A&Ox4 at this time. Denies CP or SOB.

## 2022-10-05 NOTE — ED Provider Notes (Signed)
[facility] Provider Note    Event Date/Time   First MD Initiated Contact with Patient 10/05/22 2339     (approximate)   History   Emesis   HPI  [name] is a 72 y.o. female with a history of diabetes, hypertension, hyperlipidemia, GERD, fibromyalgia, anxiety, and depression, who presents with nausea and vomiting for approximately last 3 weeks, intermittent, not occurring every day, and not precipitated by any specific triggers or associated with any specific time of day.  The patient denies any associated abdominal pain or fever.  She has no diarrhea or change in her bowel movements.  She has no fever or chills.  The patient states that she had an ultrasound done at her facility yesterday and was told that her spleen was enlarged but that her gallbladder could not be visualized.  I reviewed the past medical records.  The patient is most recent note is in evaluation from Dr. Ardelle Park from internal medicine to evaluate her [name] with follow-up of her chronic conditions including diabetes and hyperlipidemia.  She has no recent hospitalizations.  I am unable to view the results of the imaging yesterday.   Physical Exam   Triage Vital Signs: ED Triage Vitals  Enc Vitals Group     BP 10/05/22 2013 (!) 131/52     Pulse Rate 10/05/22 2013 78     Resp 10/05/22 2013 18     Temp 10/05/22 2013 98.4 F (36.9 C)     Temp Source 10/05/22 2013 Oral     SpO2 10/05/22 2013 96 %     Weight 10/05/22 2012 180 lb (81.6 kg)     Height 10/05/22 2012 5\' 2"  (1.575 m)     Head Circumference --      Peak Flow --      Pain Score 10/05/22 2012 0     Pain Loc --      Pain Edu? --      Excl. in GC? --     Most recent vital signs: Vitals:   10/05/22 2013  BP: (!) 131/52  Pulse: 78  Resp: 18  Temp: 98.4 F (36.9 C)  SpO2: 96%     General: Awake, no distress.  CV:  Good peripheral perfusion.  Resp:  Normal effort.  Abd:  Soft and nontender.  No distention.   Other:  No jaundice or scleral icterus.  Moist mucous membranes.   ED Results / Procedures / Treatments   Labs (all labs ordered are listed, but only abnormal results are displayed) Labs Reviewed  LIPASE, BLOOD - Abnormal; Notable for the following components:      Result Value   Lipase 60 (*)    All other components within normal limits  COMPREHENSIVE METABOLIC PANEL - Abnormal; Notable for the following components:   CO2 20 (*)    Calcium 8.7 (*)    All other components within normal limits  CBC  URINALYSIS, ROUTINE W REFLEX MICROSCOPIC     EKG     RADIOLOGY  CT abdomen/pelvis: I independently viewed and interpreted the images; there are no dilated bowel loops or any free air or free fluid.  Radiology report indicates no acute abnormalities.   PROCEDURES:  Critical Care performed: No  Procedures   MEDICATIONS ORDERED IN ED: Medications  iohexol (OMNIPAQUE) 300 MG/ML solution 100 mL (100 mLs Intravenous Contrast Given 10/06/22 0032)     IMPRESSION / MDM / ASSESSMENT AND PLAN / ED COURSE  I reviewed  the triage vital signs and the nursing notes.  States 72 year old female with PMH as noted above presents with nausea and vomiting over the last several weeks with no significant associated symptoms.  On exam the patient is overall well-appearing with normal vital signs.  The abdomen is soft and nontender.  Differential diagnosis includes, but is not limited to, gastroenteritis, gastroparesis, gastritis, PUD, esophageal dysmotility, biliary colic, pancreatitis, other hepatobiliary cause.  We will obtain lab workup and CT for further evaluation.  Patient's presentation is most consistent with acute complicated illness / injury requiring diagnostic workup.  The patient is on the cardiac monitor to evaluate for evidence of arrhythmia and/or significant heart rate changes.  ----------------------------------------- 1:58 AM on  10/06/2022 -----------------------------------------  Lab workup is unremarkable.  Lipase is just above reference range but bilirubin and LFTs are normal.  There is no leukocytosis.  Electrolytes are normal.  CT shows no acute abnormalities.  Based on this reassuring workup, I do not suspect hepatobiliary etiology.  Most likely cause would be gastroparesis, gastritis, or possible dysmotility.  I have prescribed Reglan to try instead of Zofran.  The patient is currently asymptomatic and has not had any vomiting since early yesterday morning.  She is stable for discharge at this time.  I recommended to the patient and family member that she follow-up with a gastroenterologist.  I gave strict return precautions and they expressed understanding.   FINAL CLINICAL IMPRESSION(S) / ED DIAGNOSES   Final diagnoses:  Nausea and vomiting, unspecified vomiting type     Rx / DC Orders   ED Discharge Orders          Ordered    metoCLOPramide (REGLAN) 10 MG tablet  Every 8 hours PRN        10/06/22 0157             Note:  This document was prepared using Dragon voice recognition software and may include unintentional dictation errors.    Dionne Bucy, MD 10/06/22 [phone]

## 2022-10-06 ENCOUNTER — Emergency Department: Payer: Medicare Other

## 2022-10-06 DIAGNOSIS — R112 Nausea with vomiting, unspecified: Secondary | ICD-10-CM | POA: Diagnosis not present

## 2022-10-06 MED ORDER — IOHEXOL 300 MG/ML  SOLN
100.0000 mL | Freq: Once | INTRAMUSCULAR | Status: AC | PRN
  Administered 2022-10-06: 100 mL via INTRAVENOUS

## 2022-10-06 MED ORDER — METOCLOPRAMIDE HCL 10 MG PO TABS: 10.0000 mg | ORAL_TABLET | Freq: Three times a day (TID) | ORAL | 0 refills | Status: DC | PRN

## 2022-10-06 NOTE — ED Notes (Signed)
Gave report to Tabiona at [facility]

## 2022-10-06 NOTE — Discharge Instructions (Addendum)
Your labs and CT scan today do not show any concerning acute findings.  Your recurrent nausea and vomiting may be due to gastroparesis, gastritis, or other stomach problems.  You may try the Reglan prescribed today instead of the Zofran that you have already been taking.   Follow-up with your primary care provider.  You may need referral to gastroenterology if your symptoms persist.  Return to the ER for new, worsening, or persistent severe nausea or vomiting, abdominal pain, fever, weakness or lightheadedness, or any other new or worsening symptoms that concern you.

## 2022-10-10 ENCOUNTER — Emergency Department
Admission: EM | Admit: 2022-10-10 | Discharge: 2022-10-10 | Disposition: A | Discharge status: Home / Self Care | Payer: Medicare Other | Attending: Emergency Medicine | Admitting: Emergency Medicine

## 2022-10-10 ENCOUNTER — Other Ambulatory Visit: Payer: Self-pay

## 2022-10-10 ENCOUNTER — Emergency Department: Payer: Medicare Other

## 2022-10-10 ENCOUNTER — Encounter: Payer: Self-pay | Admitting: Emergency Medicine

## 2022-10-10 DIAGNOSIS — I10 Essential (primary) hypertension: Secondary | ICD-10-CM | POA: Diagnosis not present

## 2022-10-10 DIAGNOSIS — R112 Nausea with vomiting, unspecified: Secondary | ICD-10-CM | POA: Diagnosis present

## 2022-10-10 DIAGNOSIS — N3001 Acute cystitis with hematuria: Secondary | ICD-10-CM | POA: Insufficient documentation

## 2022-10-10 DIAGNOSIS — E119 Type 2 diabetes mellitus without complications: Secondary | ICD-10-CM | POA: Insufficient documentation

## 2022-10-10 LAB — CBC
HCT: 37.5 % (ref 36.0–46.0)
Hemoglobin: 12.4 g/dL (ref 12.0–15.0)
MCH: 29.7 pg (ref 26.0–34.0)
MCHC: 33.1 g/dL (ref 30.0–36.0)
MCV: 89.9 fL (ref 80.0–100.0)
Platelets: 198 10*3/uL (ref 150–400)
RBC: 4.17 MIL/uL (ref 3.87–5.11)
RDW: 13.8 % (ref 11.5–15.5)
WBC: 8 10*3/uL (ref 4.0–10.5)
nRBC: 0 % (ref 0.0–0.2)

## 2022-10-10 LAB — URINALYSIS, ROUTINE W REFLEX MICROSCOPIC
Bilirubin Urine: NEGATIVE
Glucose, UA: NEGATIVE mg/dL
Ketones, ur: NEGATIVE mg/dL
Nitrite: POSITIVE — AB
Protein, ur: NEGATIVE mg/dL
Specific Gravity, Urine: 1.014 (ref 1.005–1.030)
WBC, UA: >50 WBC/hpf (ref 0–5)
pH: 5 (ref 5.0–8.0)

## 2022-10-10 LAB — COMPREHENSIVE METABOLIC PANEL
ALT: 19 U/L (ref 0–44)
AST: 25 U/L (ref 15–41)
Albumin: 4.1 g/dL (ref 3.5–5.0)
Alkaline Phosphatase: 73 U/L (ref 38–126)
Anion gap: 11 (ref 5–15)
BUN: 17 mg/dL (ref 8–23)
CO2: 21 mmol/L — ABNORMAL LOW (ref 22–32)
Calcium: 9.5 mg/dL (ref 8.9–10.3)
Chloride: 103 mmol/L (ref 98–111)
Creatinine, Ser: 1.17 mg/dL — ABNORMAL HIGH (ref 0.44–1.00)
GFR, Estimated: 49 mL/min — ABNORMAL LOW (ref >60)
Glucose, Bld: 116 mg/dL — ABNORMAL HIGH (ref 70–99)
Potassium: 3.6 mmol/L (ref 3.5–5.1)
Sodium: 135 mmol/L (ref 135–145)
Total Bilirubin: 0.8 mg/dL (ref 0.3–1.2)
Total Protein: 7.1 g/dL (ref 6.5–8.1)

## 2022-10-10 LAB — TROPONIN I (HIGH SENSITIVITY): Troponin I (High Sensitivity): 6 ng/L (ref <18)

## 2022-10-10 LAB — LIPASE, BLOOD: Lipase: 56 U/L — ABNORMAL HIGH (ref 11–51)

## 2022-10-10 MED ORDER — CEFDINIR 300 MG PO CAPS: 300.0000 mg | ORAL_CAPSULE | Freq: Two times a day (BID) | ORAL | 0 refills | Status: AC

## 2022-10-10 MED ORDER — SODIUM CHLORIDE 0.9 % IV BOLUS
500.0000 mL | Freq: Once | INTRAVENOUS | Status: AC
  Administered 2022-10-10: 500 mL via INTRAVENOUS

## 2022-10-10 MED ORDER — ONDANSETRON 4 MG PO TBDP: 4.0000 mg | ORAL_TABLET | Freq: Three times a day (TID) | ORAL | 0 refills | Status: DC | PRN

## 2022-10-10 MED ORDER — IOHEXOL 300 MG/ML  SOLN
100.0000 mL | Freq: Once | INTRAMUSCULAR | Status: AC | PRN
  Administered 2022-10-10: 100 mL via INTRAVENOUS

## 2022-10-10 MED ORDER — FAMOTIDINE IN NACL 20-0.9 MG/50ML-% IV SOLN
20.0000 mg | Freq: Once | INTRAVENOUS | Status: AC
  Administered 2022-10-10: 20 mg via INTRAVENOUS
  Filled 2022-10-10: qty 50

## 2022-10-10 MED ORDER — SODIUM CHLORIDE 0.9 % IV SOLN
1.0000 g | Freq: Once | INTRAVENOUS | Status: AC
  Administered 2022-10-10: 1 g via INTRAVENOUS
  Filled 2022-10-10: qty 10

## 2022-10-10 NOTE — ED Provider Notes (Signed)
[facility] Provider Note    Event Date/Time   First MD Initiated Contact with Patient 10/10/22 1018     (approximate)   History   Emesis   HPI  [name] is a 72 y.o. female past medical history significant for diabetes, hypertension, hyperlipidemia, GERD, fibromyalgia and anxiety who presents to the emergency department for ongoing episodes of nausea and vomiting.  Symptoms started approximately 3 to 4 weeks ago.  States that her symptoms of nausea and vomiting has been increasing.  Was recently evaluated at the emergency department and had a workup done including a CT scan that did not show any findings to explain her nausea and vomiting.  Today had worsening episode of nausea and vomiting.  Complaining of left arm pain so she was sent to the emergency department.  States that her symptoms have improved at this time.  Denies any significant episodes of vomiting over the past 24 hours.  Uncertain of her last bowel movement.  No diarrhea.  Denies any blood in her stool or melena.  No falls or trauma.  No recent endoscopy or colonoscopy.  No new medications but does state that this morning she was given her medications on an empty stomach.  Denies any dysuria, urinary urgency or frequency.     Physical Exam   Triage Vital Signs: ED Triage Vitals [10/10/22 0929]  Enc Vitals Group     BP (!) 125/59     Pulse Rate 81     Resp 18     Temp 98.2 F (36.8 C)     Temp src      SpO2 93 %     Weight      Height      Head Circumference      Peak Flow      Pain Score 0     Pain Loc      Pain Edu?      Excl. in GC?     Most recent vital signs: Vitals:   10/10/22 0929  BP: (!) 125/59  Pulse: 81  Resp: 18  Temp: 98.2 F (36.8 C)  SpO2: 93%    Physical Exam Constitutional:      Appearance: She is well-developed.  HENT:     Head: Atraumatic.  Eyes:     Conjunctiva/sclera: Conjunctivae normal.  Cardiovascular:     Rate and Rhythm: Regular  rhythm.  Pulmonary:     Effort: No respiratory distress.  Abdominal:     General: There is no distension.     Tenderness: There is abdominal tenderness (epigastric). There is no right CVA tenderness, left CVA tenderness or guarding.     Comments: Negative Murphy sign  Musculoskeletal:        General: Normal range of motion.     Cervical back: Normal range of motion.  Skin:    General: Skin is warm.  Neurological:     Mental Status: She is alert. Mental status is at baseline.  Psychiatric:        Mood and Affect: Mood normal.     IMPRESSION / MDM / ASSESSMENT AND PLAN / ED COURSE  I reviewed the triage vital signs and the nursing notes.  On chart review patient had a recent ED and visit on the beginning of the month and had negative workup and CT scan.  Differential diagnosis including gastritis/PUD, ACS, symptomatic cholelithiasis, gastroparesis, malignancy  EKG  I, Corena Herter, the attending physician, personally viewed and interpreted  this ECG.   Rate: Normal  Rhythm: Normal sinus  Axis: Normal  Intervals: Normal  ST&T Change: None  No tachycardic or bradycardic dysrhythmias while on cardiac telemetry.  RADIOLOGY I independently reviewed imaging, my interpretation of imaging: CT scan of abdomen and pelvis  LABS (all labs ordered are listed, but only abnormal results are displayed) Labs interpreted as -    Labs Reviewed  LIPASE, BLOOD - Abnormal; Notable for the following components:      Result Value   Lipase 56 (*)    All other components within normal limits  COMPREHENSIVE METABOLIC PANEL - Abnormal; Notable for the following components:   CO2 21 (*)    Glucose, Bld 116 (*)    Creatinine, Ser 1.17 (*)    GFR, Estimated 49 (*)    All other components within normal limits  URINALYSIS, ROUTINE W REFLEX MICROSCOPIC - Abnormal; Notable for the following components:   Color, Urine YELLOW (*)    APPearance CLOUDY (*)    Hgb urine dipstick SMALL (*)     Nitrite POSITIVE (*)    Leukocytes,Ua LARGE (*)    Bacteria, UA MANY (*)    All other components within normal limits  URINE CULTURE  CBC  TROPONIN I (HIGH SENSITIVITY)     MDM  Given IV fluids and IV Pepcid.  CO2 mildly low at 21.  Glucose level within normal limits.  Mild elevation of her creatinine from her normal at 1.17 with a baseline of 0.8.  No significant electrolyte abnormalities.  Lipase mildly elevated at 56  UA consistent with urinary tract infection with positive nitrites and leukocyte esterase.  On chart review no history of a resistant urinary tract infection.  Urine culture was sent.  Given 1 dose of IV Rocephin.  Otherwise well-appearing do not feel that the patient needs admitted.  No other signs of sepsis.  CT abdomen and pelvis with no findings of acute intra-abdominal pathology.  On reevaluation patient tolerating p.o. and feeling better.  Sent prescriptions for antibiotics and Zofran.  Discussed close follow-up with primary care physician and given return precautions.     PROCEDURES:  Critical Care performed: No  Procedures  Patient's presentation is most consistent with acute presentation with potential threat to life or bodily function.   MEDICATIONS ORDERED IN ED: Medications  cefTRIAXone (ROCEPHIN) 1 g in sodium chloride 0.9 % 100 mL IVPB (has no administration in time range)  famotidine (PEPCID) IVPB 20 mg premix (0 mg Intravenous Stopped 10/10/22 1222)  sodium chloride 0.9 % bolus 500 mL (500 mLs Intravenous New Bag/Given 10/10/22 1151)  iohexol (OMNIPAQUE) 300 MG/ML solution 100 mL (100 mLs Intravenous Contrast Given 10/10/22 1111)    FINAL CLINICAL IMPRESSION(S) / ED DIAGNOSES   Final diagnoses:  Nausea and vomiting, unspecified vomiting type  Acute cystitis with hematuria     Rx / DC Orders   ED Discharge Orders     None        Note:  This document was prepared using Dragon voice recognition software and may include unintentional  dictation errors.   Corena Herter, MD 10/10/22 [phone]

## 2022-10-10 NOTE — ED Triage Notes (Signed)
Patient to ED via [facility] for [facility] from [facility] for N/V x1 week. Patient has had increased difficulties walking and left arm pain that started today. AOx4  18 L AC- given 4mg  zofran and of NaCl

## 2022-10-10 NOTE — Discharge Instructions (Signed)
You were seen in the emergency department for nausea and vomiting.  You were diagnosed with a urinary tract infection.  You were given IV fluids and your first dose of IV antibiotics in the emergency department.  You are given a prescription for oral antibiotics and you need to take your next dose tomorrow morning.  You are also given a prescription for nausea medication.  You had a CT scan done that did not show any obvious findings in your abdomen or pelvis.  Follow-up closely with your primary care physician.  If you have ongoing episodes of nausea and vomiting discussed with your primary care physician a GI referral for possible endoscopy.  Return to the emergency department for any worsening symptoms.

## 2022-12-05 ENCOUNTER — Ambulatory Visit: Payer: Medicare Other | Admitting: Physician Assistant

## 2022-12-12 DIAGNOSIS — I10 Essential (primary) hypertension: Secondary | ICD-10-CM | POA: Insufficient documentation

## 2022-12-12 DIAGNOSIS — E785 Hyperlipidemia, unspecified: Secondary | ICD-10-CM | POA: Insufficient documentation

## 2022-12-15 NOTE — Progress Notes (Unsigned)
Celso Amy, PA-C [address]  [address]  [place], [place] [address]  Main: [phone]  Fax: [phone]   Gastroenterology Consultation  Referring Provider:     [facility] Primary Care Physician:  [facility] Primary Gastroenterologist:  Celso Amy, PA-C / Dr. Wyline Mood   Reason for Consultation:     Nausea, Vomiting        HPI:   [name] is a 72 y.o. y/o female referred for consultation & management  by [facility]    72 year old female presents for episodes of nausea and vomiting acid which started 2 months ago in June 2024.  It happened in the morning after she took her medication.  She was taking her medicines before breakfast on empty stomach.  She is here today with her son and daughter-in-law.  She lives at [facility].    A few weeks ago staff at the assisted living facility began giving her medicine after breakfast when she had food on her stomach.  She was also started on Prilosec 20 mg once daily with benefit.  After that her nausea and vomiting resolved.  She is currently feeling a lot better.  She has not had any recent episodes of abdominal pain, heartburn, dysphagia, nausea, vomiting, diarrhea, constipation, melena, hematochezia, or weight loss.  No previous GI evaluation, EGD, or colonoscopy.  She went to the ED twice in June 2024 for episodes of nausea and vomiting.  Had 2 abdominal pelvic CTs with contrast which showed no acute abnormality.  Previous cholecystectomy, appendectomy, and hysterectomy.  No biliary dilatation.  Stable periumbilical abdominal wall hernia containing fat.  Labs significant for mildly elevated lipase 56 and 60.  UA positive for UTI treated with antibiotic.  Elevated creatinine 1.17.  Glucose 116.  Normal LFTs.  Normal CBC with hemoglobin 12.4.  Past Medical History:  Diagnosis Date   Abnormal EKG    PREOP EKG -NSR, CANNOT RULE OUT  INFERIOR INFARCT AGE -INDETERMIINATE--FOLLOWED UP BY PT'S MEDICAL DOCTOR WITH ECHOCARDIOGRAM AND PT CLEARED FOR LEFT TOTAL KNEE ARTHROPLASTY SURGERY.   Anxiety    Arthritis    Depression    Fibromyalgia    GERD (gastroesophageal reflux disease)    Glaucoma    Hyperlipidemia    Hypertension    IBS (irritable bowel syndrome)    Incontinence of urine    PONV (postoperative nausea and vomiting)    Shortness of breath    WITH EXERTION   Umbilical hernia    NO PAIN    Past Surgical History:  Procedure Laterality Date   ABDOMINAL HYSTERECTOMY  1990   APPENDECTOMY  1955   CHOLECYSTECTOMY  2007   EYE SURGERY     LASER EYE SURGERY -LEFT FOR TX GLAUCOMA   JOINT REPLACEMENT     LEFT TOTAL HIP REPLACEMENT 1998,  RIGHT TOTAL KNEE REPLACEMENT OCT 2012   LAPAROSCOPY TO REMOVE SCAR TISSUE  1991   LEFT KNEE ARTHROSCOPY 2012     RT SHOULDER ARTHROSCOPY  2011     TONSILLECTOMY     TOTAL KNEE ARTHROPLASTY  02/20/2012   Procedure: TOTAL KNEE ARTHROPLASTY;  Surgeon: Shelda Pal, MD;  Location: [facility];  Service: Orthopedics;  Laterality: Left;   TRANSVAGINAL TAPE  2000    Prior to Admission medications   Medication Sig Start Date End Date Taking? Authorizing Provider  baclofen (LIORESAL) 10 MG tablet Take 5 mg by mouth every  8 (eight) hours as needed (back pain).    [provider]  clonazePAM (KLONOPIN) 0.5 MG tablet Take 1 mg by mouth at bedtime.     [provider]  docusate sodium (COLACE) 100 MG capsule Take 100 mg by mouth daily.    [provider]  estradiol (VIVELLE-DOT) 0.1 MG/24HR patch Place 1 patch onto the skin 2 (two) times a week. 10/13/19   [provider]  fluticasone (FLONASE) 50 MCG/ACT nasal spray Place 1 spray into both nostrils daily as needed for allergies or rhinitis.    [provider]  ibuprofen (ADVIL) 400 MG tablet Take 400 mg by mouth every 6 (six) hours as needed (pain).    [provider]  loratadine (CLARITIN)  10 MG tablet Take 10 mg by mouth daily.    [provider]  metoCLOPramide (REGLAN) 10 MG tablet Take 1 tablet (10 mg total) by mouth every 8 (eight) hours as needed for nausea or vomiting. 10/06/22 10/06/23  Dionne Bucy, MD  omeprazole (PRILOSEC) 20 MG capsule Take 20 mg by mouth daily. 10/12/19   [provider]  ondansetron (ZOFRAN-ODT) 4 MG disintegrating tablet Take 1 tablet (4 mg total) by mouth every 8 (eight) hours as needed for nausea or vomiting. 10/10/22   Corena Herter, MD  potassium chloride (KLOR-CON) 10 MEQ tablet Take 2 tablets (20 mEq total) by mouth daily for 7 days. 08/21/19 08/28/19  Triplett, Rulon Eisenmenger B, FNP  potassium chloride SA (KLOR-CON) 20 MEQ tablet Take 20 mEq by mouth 2 (two) times daily. 10/12/19   [provider]  pravastatin (PRAVACHOL) 80 MG tablet Take 80 mg by mouth daily.    [provider]  traMADol (ULTRAM) 50 MG tablet Take 1 tablet (50 mg total) by mouth every 6 (six) hours as needed. 08/21/19   Chinita Pester, FNP    Family History  Problem Relation Age of Onset   Bone cancer Mother    Arthritis Mother    Cancer Mother    Parkinsonism Mother    Arthritis Father    Stroke Father    Diabetes Father    Breast cancer Daughter    Stroke Sister    Parkinsonism Brother    Diabetes Brother      Social History   Tobacco Use   Smoking status: Former    Types: Cigarettes   Smokeless tobacco: Never   Tobacco comments:    QUIT SMOKING 18 YRS AGO  1995  Vaping Use   Vaping status: Never Used  Substance Use Topics   Alcohol use: No   Drug use: No    Allergies as of 12/16/2022 - Review Complete 12/16/2022  Allergen Reaction Noted   Erythromycin Other (See Comments) 02/14/2012   Methocarbamol Nausea Only 02/20/2012   Sulfa antibiotics Other (See Comments) 02/14/2012    Review of Systems:    All systems reviewed and negative except where noted in HPI.   Physical Exam:  BP (!) 150/73   Pulse 84   Temp 98.5 F  (36.9 C)   Ht 5\' 3"  (1.6 m)   Wt 178 lb 3.2 oz (80.8 kg)   BMI 31.57 kg/m  No LMP recorded. Patient has had a hysterectomy.  General:   Alert, febrile elderly female, wheelchair dependent, pleasant and cooperative in NAD Lungs:  Respirations even and unlabored.  Clear throughout to auscultation.   No wheezes, crackles, or rhonchi. No acute distress. Heart:  Regular rate and rhythm; no murmurs, clicks, rubs, or gallops.  Abdomen:  Normal bowel sounds.  No bruits.  Soft, and obese without masses, hepatosplenomegaly or hernias noted.  No Tenderness.  No guarding or rebound tenderness.    Neurologic:  Alert and oriented x3; sitting in a wheelchair.  Mild memory impairment. Psych:  Alert and cooperative. Normal mood and affect.  Imaging Studies: No results found.  Assessment and Plan:   SUTTYN DERIGGI is a 72 y.o. y/o female has been referred for ED F/U episodic N/V; Currently resolved and assymptomatic.  Improved on Prilosec 20mg  daily.  Suspect her nausea and vomiting episodes were due to acid reflux and gastritis.  Also due to taking medication on empty stomach.  She was started on Prilosec 20 mg once daily with great benefit.  Also started taking medication after breakfast with food which has helped.  Currently she has no more nausea, vomiting, or upper GI symptoms.  N/V We discussed further GI test such as upper GI series or EGD, however patient declined as she is feeling a lot better.  If she has recurrent nausea or vomiting, we can consider upper GI series and or EGD.  Continue taking her a.m. medications with food.  GERD Continue Prilosec 20 Mg once daily.  Elevated Lipase (mildly elevated); Elevated Creatinine Lab: Lipase, BMP  Follow up As needed if she has recurrent GI symptoms.  Celso Amy, PA-C

## 2022-12-16 ENCOUNTER — Ambulatory Visit (INDEPENDENT_AMBULATORY_CARE_PROVIDER_SITE_OTHER): Payer: Medicare Other | Admitting: Physician Assistant

## 2022-12-16 ENCOUNTER — Encounter: Payer: Self-pay | Admitting: Physician Assistant

## 2022-12-16 VITALS — BP 150/73 | HR 84 | Temp 98.5°F | Ht 63.0 in | Wt 178.2 lb

## 2022-12-16 DIAGNOSIS — K219 Gastro-esophageal reflux disease without esophagitis: Secondary | ICD-10-CM

## 2022-12-16 DIAGNOSIS — R112 Nausea with vomiting, unspecified: Secondary | ICD-10-CM

## 2022-12-16 DIAGNOSIS — R748 Abnormal levels of other serum enzymes: Secondary | ICD-10-CM

## 2022-12-16 DIAGNOSIS — R7989 Other specified abnormal findings of blood chemistry: Secondary | ICD-10-CM | POA: Diagnosis not present

## 2022-12-17 ENCOUNTER — Telehealth: Payer: Self-pay

## 2022-12-17 LAB — BASIC METABOLIC PANEL
BUN/Creatinine Ratio: 18 (ref 12–28)
BUN: 13 mg/dL (ref 8–27)
CO2: 25 mmol/L (ref 20–29)
Calcium: 9.3 mg/dL (ref 8.7–10.3)
Chloride: 102 mmol/L (ref 96–106)
Creatinine, Ser: 0.71 mg/dL (ref 0.57–1.00)
Glucose: 96 mg/dL (ref 70–99)
Potassium: 4.2 mmol/L (ref 3.5–5.2)
Sodium: 141 mmol/L (ref 134–144)
eGFR: 90 mL/min/{1.73_m2} (ref >59)

## 2022-12-17 LAB — LIPASE: Lipase: 51 U/L (ref 14–85)

## 2022-12-17 NOTE — Telephone Encounter (Signed)
Spoke with son Link Snuffer.    Call and notify patient, her son, and daughter-in-law (per DPR): Lipase pancreas test has returned to normal.  Kidney function has also greatly improved to normal.  Electrolytes are normal.  Nothing worrisome.  Continue with current plan.  No further GI testing is needed at this time.  We are happy to see her back if she has recurrent GI symptoms.

## 2022-12-17 NOTE — Progress Notes (Signed)
Call and notify patient, her son, and daughter-in-law (per DPR): Lipase pancreas test has returned to normal.  Kidney function has also greatly improved to normal.  Electrolytes are normal.  Nothing worrisome.  Continue with current plan.  No further GI testing is needed at this time.  We are happy to see her back if she has recurrent GI symptoms.

## 2022-12-25 ENCOUNTER — Ambulatory Visit: Payer: Self-pay | Admitting: Urology

## 2023-01-22 ENCOUNTER — Telehealth: Payer: Self-pay

## 2023-01-22 ENCOUNTER — Ambulatory Visit (INDEPENDENT_AMBULATORY_CARE_PROVIDER_SITE_OTHER): Payer: Medicare Other | Admitting: Urology

## 2023-01-22 VITALS — BP 150/81 | HR 91 | Ht 63.0 in | Wt 179.2 lb

## 2023-01-22 DIAGNOSIS — N3281 Overactive bladder: Secondary | ICD-10-CM

## 2023-01-22 LAB — BLADDER SCAN AMB NON-IMAGING: Scan Result: 46

## 2023-01-22 MED ORDER — MIRABEGRON ER 50 MG PO TB24
50.0000 mg | ORAL_TABLET | Freq: Every day | ORAL | 11 refills | Status: DC
Start: 2023-01-22 — End: 2023-02-18

## 2023-01-22 MED ORDER — MIRABEGRON ER 50 MG PO TB24
50.0000 mg | ORAL_TABLET | Freq: Every day | ORAL | 11 refills | Status: DC
Start: 2023-01-22 — End: 2023-01-22

## 2023-01-22 NOTE — Progress Notes (Signed)
Marcelle Overlie Plume,acting as a scribe for Vanna Scotland, MD.,have documented all relevant documentation on the behalf of Vanna Scotland, MD,as directed by  Vanna Scotland, MD while in the presence of Vanna Scotland, MD.  01/22/2023 3:02 PM   [name] [birth year] [id number]  Referring provider: [facility] [address] [place],  [place] [address]  Chief Complaint  Patient presents with   Establish Care   Over Active Bladder    HPI:  72 year-old female who is referred for further evaluation of urinary issues. She currently resides in [facility] assisted living facility. She is on Myrbetriq 25 mg. Her referral was actually placed several months ago.   Today, she reports urgency, frequency, and urinary leakage. She goes less than 8 times a day. She does not get up at night to urinate more than 1-2 times. When she has the urge to urinate, she only has a mild urge. She does leak with laughing, coughing, and sneezing. When she does feel the urge, she feels like she can not get to the bathroom in time. She leaks about 1-2 times a day. She wears 2 diapers per day. She does not limit her fluids. She is always aware of where the bathrooms are. She feels as though the Myrbetriq has not improved her symptoms. She is concerned about the cost and is interested in a generic option. She denies any blood in her urine, burning, constipation, or vaginal symptoms.   She has no history of diabetes or neurological conditions.   She was unable to provide a urine sample today.   Results for orders placed or performed in visit on 01/22/23  Bladder Scan (Post Void Residual) in office  Result Value Ref Range   Scan Result 46 ml       PMH: Past Medical History:  Diagnosis Date   Abnormal EKG    PREOP EKG -NSR, CANNOT RULE OUT INFERIOR INFARCT AGE -INDETERMIINATE--FOLLOWED UP BY PT'S MEDICAL DOCTOR WITH ECHOCARDIOGRAM AND PT CLEARED FOR LEFT TOTAL KNEE ARTHROPLASTY SURGERY.    Anxiety    Arthritis    Depression    Fibromyalgia    GERD (gastroesophageal reflux disease)    Glaucoma    Hyperlipidemia    Hypertension    IBS (irritable bowel syndrome)    Incontinence of urine    PONV (postoperative nausea and vomiting)    Shortness of breath    WITH EXERTION   Umbilical hernia    NO PAIN    Surgical History: Past Surgical History:  Procedure Laterality Date   ABDOMINAL HYSTERECTOMY  1990   APPENDECTOMY  1955   CHOLECYSTECTOMY  2007   EYE SURGERY     LASER EYE SURGERY -LEFT FOR TX GLAUCOMA   JOINT REPLACEMENT     LEFT TOTAL HIP REPLACEMENT 1998,  RIGHT TOTAL KNEE REPLACEMENT OCT 2012   LAPAROSCOPY TO REMOVE SCAR TISSUE  1991   LEFT KNEE ARTHROSCOPY 2012     RT SHOULDER ARTHROSCOPY  2011     TONSILLECTOMY     TOTAL KNEE ARTHROPLASTY  02/20/2012   Procedure: TOTAL KNEE ARTHROPLASTY;  Surgeon: Shelda Pal, MD;  Location: [facility];  Service: Orthopedics;  Laterality: Left;   TRANSVAGINAL TAPE  2000    Home Medications:  Allergies as of 01/22/2023       Reactions   Erythromycin Other (See Comments)   Upset stomach   Methocarbamol Nausea Only   Sulfa Antibiotics Other (See Comments)   Turns  eyes red        Medication List        Accurate as of January 22, 2023  3:02 PM. If you have any questions, ask your nurse or doctor.          STOP taking these medications    ibuprofen 400 MG tablet Commonly known as: ADVIL   loratadine 10 MG tablet Commonly known as: CLARITIN   potassium chloride 10 MEQ tablet Commonly known as: KLOR-CON       TAKE these medications    acetaminophen 325 MG tablet Commonly known as: TYLENOL Take 325 mg by mouth every 4 (four) hours as needed.   ascorbic acid 500 MG tablet Commonly known as: VITAMIN C Take 500 mg by mouth daily.   buPROPion 100 MG tablet Commonly known as: WELLBUTRIN Take 100 mg by mouth daily.   cholecalciferol 25 MCG (1000 UNIT) tablet Commonly known as: VITAMIN D3 Take  1,000 Units by mouth daily.   clonazePAM 0.5 MG tablet Commonly known as: KLONOPIN Take 1 mg by mouth at bedtime.   Colace 100 MG capsule Generic drug: docusate sodium Take 100 mg by mouth daily.   estradiol 0.1 MG/24HR patch Commonly known as: VIVELLE-DOT Place 1 patch onto the skin 2 (two) times a week.   Fish Oil 300 MG Caps Take by mouth.   fluticasone 50 MCG/ACT nasal spray Commonly known as: FLONASE Place 1 spray into both nostrils daily as needed for allergies or rhinitis.   folic acid 400 MCG tablet Commonly known as: FOLVITE Take 400 mcg by mouth daily.   furosemide 20 MG tablet Commonly known as: LASIX Take 20 mg by mouth.   Lidocaine Pain Relief 4 % Generic drug: lidocaine Place 1 patch onto the skin daily.   lisinopril 10 MG tablet Commonly known as: ZESTRIL Take 10 mg by mouth daily.   metoCLOPramide 10 MG tablet Commonly known as: REGLAN Take 1 tablet (10 mg total) by mouth every 8 (eight) hours as needed for nausea or vomiting.   mirabegron ER 50 MG Tb24 tablet Commonly known as: Myrbetriq Take 1 tablet (50 mg total) by mouth daily. What changed:  medication strength how much to take   omeprazole 20 MG capsule Commonly known as: PRILOSEC Take 20 mg by mouth daily.   ondansetron 4 MG disintegrating tablet Commonly known as: ZOFRAN-ODT Take 1 tablet (4 mg total) by mouth every 8 (eight) hours as needed for nausea or vomiting.   potassium chloride SA 20 MEQ tablet Commonly known as: KLOR-CON M Take 20 mEq by mouth 2 (two) times daily.   pravastatin 80 MG tablet Commonly known as: PRAVACHOL Take 80 mg by mouth daily.   sertraline 100 MG tablet Commonly known as: ZOLOFT Take 100 mg by mouth daily.   traMADol 50 MG tablet Commonly known as: Ultram Take 1 tablet (50 mg total) by mouth every 6 (six) hours as needed.        Allergies:  Allergies  Allergen Reactions   Erythromycin Other (See Comments)    Upset stomach    Methocarbamol Nausea Only   Sulfa Antibiotics Other (See Comments)    Turns eyes red    Family History: Family History  Problem Relation Age of Onset   Bone cancer Mother    Arthritis Mother    Cancer Mother    Parkinsonism Mother    Arthritis Father    Stroke Father    Diabetes Father    Breast cancer Daughter  Stroke Sister    Parkinsonism Brother    Diabetes Brother     Social History:  reports that she has quit smoking. Her smoking use included cigarettes. She has never used smokeless tobacco. She reports that she does not drink alcohol and does not use drugs.   Physical Exam: BP (!) 150/81   Pulse 91   Ht 5\' 3"  (1.6 m)   Wt 179 lb 4 oz (81.3 kg)   BMI 31.75 kg/m   Constitutional:  Alert and oriented, No acute distress. HEENT:  AT, moist mucus membranes.  Trachea midline, no masses. Neurologic: Grossly intact, no focal deficits, moving all 4 extremities. Psychiatric: Normal mood and affect.   Assessment & Plan:    1. Overactive bladder - Plan to increase Myrbetriq to 50 mg as current dose is ineffective - Follow up with catheterized urine specimen collection to rule out underlying issues such as bladder problems or infections - Discussed the cost of medication and availability of a generic option - Discussed the need to ensure that the medication is administered consistently at the assisted living facility  Return in about 1 month (around 02/21/2023) for assessment of medication efficacy.   [facility] [address] [place], [place] [address] [phone]

## 2023-01-22 NOTE — Telephone Encounter (Signed)
Called [place] and verified which [employer] they used and medication re sent to the correct pharmacy.

## 2023-01-22 NOTE — Telephone Encounter (Signed)
Incoming call from [employer], Pharmacy tech Maralyn Sago states that this is not an established patient of theirs and they are only a small pharmacy and question if the medication Myrbetriq was sent to them in error.

## 2023-02-18 ENCOUNTER — Ambulatory Visit (INDEPENDENT_AMBULATORY_CARE_PROVIDER_SITE_OTHER): Payer: Medicare Other | Admitting: Physician Assistant

## 2023-02-18 ENCOUNTER — Encounter: Payer: Self-pay | Admitting: Physician Assistant

## 2023-02-18 VITALS — BP 148/84 | HR 88

## 2023-02-18 DIAGNOSIS — R8271 Bacteriuria: Secondary | ICD-10-CM | POA: Diagnosis not present

## 2023-02-18 DIAGNOSIS — N3281 Overactive bladder: Secondary | ICD-10-CM

## 2023-02-18 LAB — URINALYSIS, COMPLETE
Bilirubin, UA: NEGATIVE
Glucose, UA: NEGATIVE
Ketones, UA: NEGATIVE
Nitrite, UA: POSITIVE — AB
Protein,UA: NEGATIVE
RBC, UA: NEGATIVE
Specific Gravity, UA: 1.015 (ref 1.005–1.030)
Urobilinogen, Ur: 0.2 mg/dL (ref 0.2–1.0)
pH, UA: 6 (ref 5.0–7.5)

## 2023-02-18 LAB — MICROSCOPIC EXAMINATION

## 2023-02-18 MED ORDER — TROSPIUM CHLORIDE ER 60 MG PO CP24: 1.0000 | ORAL_CAPSULE | Freq: Every day | ORAL | 1 refills | Status: DC

## 2023-02-18 NOTE — Progress Notes (Signed)
02/18/2023 3:37 PM   [name] 01-30-1951 [id number]  CC: Chief Complaint  Patient presents with   Follow-up   HPI: [name] is a 72 y.o. female with PMH OAB on Myrbetriq who presents today for 1 month follow-up and cath UA after increasing dose to 50 mg daily.  She is accompanied today by her son and daughter-in-law.  Today she reports no change in her OAB symptoms on increased Myrbetriq.  They continue to have cost concerns about pharmacotherapy.  She continues to report bothersome urinary frequency.  She denies dysuria or gross hematuria.  In-office catheterized UA today positive for nitrites and 1+ leukocytes; urine microscopy with 11-30 WBCs/HPF and many bacteria. Measured residual .  PMH: Past Medical History:  Diagnosis Date   Abnormal EKG    PREOP EKG -NSR, CANNOT RULE OUT INFERIOR INFARCT AGE -INDETERMIINATE--FOLLOWED UP BY PT'S MEDICAL DOCTOR WITH ECHOCARDIOGRAM AND PT CLEARED FOR LEFT TOTAL KNEE ARTHROPLASTY SURGERY.   Anxiety    Arthritis    Depression    Fibromyalgia    GERD (gastroesophageal reflux disease)    Glaucoma    Hyperlipidemia    Hypertension    IBS (irritable bowel syndrome)    Incontinence of urine    PONV (postoperative nausea and vomiting)    Shortness of breath    WITH EXERTION   Umbilical hernia    NO PAIN    Surgical History: Past Surgical History:  Procedure Laterality Date   ABDOMINAL HYSTERECTOMY  1990   APPENDECTOMY  1955   CHOLECYSTECTOMY  2007   EYE SURGERY     LASER EYE SURGERY -LEFT FOR TX GLAUCOMA   JOINT REPLACEMENT     LEFT TOTAL HIP REPLACEMENT 1998,  RIGHT TOTAL KNEE REPLACEMENT OCT 2012   LAPAROSCOPY TO REMOVE SCAR TISSUE  1991   LEFT KNEE ARTHROSCOPY 2012     RT SHOULDER ARTHROSCOPY  2011     TONSILLECTOMY     TOTAL KNEE ARTHROPLASTY  02/20/2012   Procedure: TOTAL KNEE ARTHROPLASTY;  Surgeon: Shelda Pal, MD;  Location: [facility];  Service: Orthopedics;  Laterality: Left;   TRANSVAGINAL TAPE   2000    Home Medications:  Allergies as of 02/18/2023       Reactions   Erythromycin Other (See Comments)   Upset stomach   Methocarbamol Nausea Only   Sulfa Antibiotics Other (See Comments)   Turns eyes red        Medication List        Accurate as of February 18, 2023  3:37 PM. If you have any questions, ask your nurse or doctor.          STOP taking these medications    mirabegron ER 50 MG Tb24 tablet Commonly known as: Myrbetriq Stopped by: Carman Ching       TAKE these medications    acetaminophen 325 MG tablet Commonly known as: TYLENOL Take 325 mg by mouth every 4 (four) hours as needed.   ascorbic acid 500 MG tablet Commonly known as: VITAMIN C Take 500 mg by mouth daily.   buPROPion 100 MG tablet Commonly known as: WELLBUTRIN Take 100 mg by mouth daily.   cholecalciferol 25 MCG (1000 UNIT) tablet Commonly known as: VITAMIN D3 Take 1,000 Units by mouth daily.   clonazePAM 0.5 MG tablet Commonly known as: KLONOPIN Take 1 mg by mouth at bedtime.   Colace 100 MG capsule Generic drug: docusate sodium Take 100 mg by mouth daily.   estradiol 0.1  MG/24HR patch Commonly known as: VIVELLE-DOT Place 1 patch onto the skin 2 (two) times a week.   Fish Oil 300 MG Caps Take by mouth.   fluticasone 50 MCG/ACT nasal spray Commonly known as: FLONASE Place 1 spray into both nostrils daily as needed for allergies or rhinitis.   folic acid 400 MCG tablet Commonly known as: FOLVITE Take 400 mcg by mouth daily.   furosemide 20 MG tablet Commonly known as: LASIX Take 20 mg by mouth.   Lidocaine Pain Relief 4 % Generic drug: lidocaine Place 1 patch onto the skin daily.   lisinopril 10 MG tablet Commonly known as: ZESTRIL Take 10 mg by mouth daily.   metoCLOPramide 10 MG tablet Commonly known as: REGLAN Take 1 tablet (10 mg total) by mouth every 8 (eight) hours as needed for nausea or vomiting.   omeprazole 20 MG capsule Commonly  known as: PRILOSEC Take 20 mg by mouth daily.   ondansetron 4 MG disintegrating tablet Commonly known as: ZOFRAN-ODT Take 1 tablet (4 mg total) by mouth every 8 (eight) hours as needed for nausea or vomiting.   potassium chloride SA 20 MEQ tablet Commonly known as: KLOR-CON M Take 20 mEq by mouth 2 (two) times daily.   pravastatin 80 MG tablet Commonly known as: PRAVACHOL Take 80 mg by mouth daily.   sertraline 100 MG tablet Commonly known as: ZOLOFT Take 100 mg by mouth daily.   traMADol 50 MG tablet Commonly known as: Ultram Take 1 tablet (50 mg total) by mouth every 6 (six) hours as needed.   Trospium Chloride 60 MG Cp24 Take 1 capsule (60 mg total) by mouth daily. Started by: Carman Ching        Allergies:  Allergies  Allergen Reactions   Erythromycin Other (See Comments)    Upset stomach   Methocarbamol Nausea Only   Sulfa Antibiotics Other (See Comments)    Turns eyes red    Family History: Family History  Problem Relation Age of Onset   Bone cancer Mother    Arthritis Mother    Cancer Mother    Parkinsonism Mother    Arthritis Father    Stroke Father    Diabetes Father    Breast cancer Daughter    Stroke Sister    Parkinsonism Brother    Diabetes Brother     Social History:   reports that she has quit smoking. Her smoking use included cigarettes. She has never used smokeless tobacco. She reports that she does not drink alcohol and does not use drugs.  Physical Exam: BP (!) 148/84   Pulse 88   Constitutional:  Alert and oriented, no acute distress, nontoxic appearing HEENT: , AT Cardiovascular: No clubbing, cyanosis, or edema Respiratory: Normal respiratory effort, no increased work of breathing Skin: No rashes, bruises or suspicious lesions Neurologic: Grossly intact, no focal deficits, moving all 4 extremities Psychiatric: Normal mood and affect  Laboratory Data: Results for orders placed or performed in visit on 02/18/23   Microscopic Examination   Urine  Result Value Ref Range   WBC, UA 11-30 (A) 0 - 5 /hpf   RBC, Urine 0-2 0 - 2 /hpf   Epithelial Cells (non renal) 0-10 0 - 10 /hpf   Bacteria, UA Many (A) None seen/Few  Urinalysis, Complete  Result Value Ref Range   Specific Gravity, UA 1.015 1.005 - 1.030   pH, UA 6.0 5.0 - 7.5   Color, UA Yellow Yellow   Appearance Ur Hazy (A) Clear  Leukocytes,UA 1+ (A) Negative   Protein,UA Negative Negative/Trace   Glucose, UA Negative Negative   Ketones, UA Negative Negative   RBC, UA Negative Negative   Bilirubin, UA Negative Negative   Urobilinogen, Ur 0.2 0.2 - 1.0 mg/dL   Nitrite, UA Positive (A) Negative   Microscopic Examination See below:    Assessment & Plan:   1. OAB (overactive bladder) She has failed Myrbetriq.  Leslye Peer is not on her formulary.  I offered her trial of trospium ER 60 mg daily.  Will plan to see her back next month for symptom check.  We also discussed third line therapies including PTNS, intravesical Botox, and InterStim as alternatives. - CULTURE, URINE COMPREHENSIVE - Urinalysis, Complete - Trospium Chloride 60 MG CP24; Take 1 capsule (60 mg total) by mouth daily.  Dispense: 30 capsule; Refill: 1  2. Bacteriuria Cath UA appears grossly infected, however she is denying dysuria and her baseline is unknown.  Unclear if this is playing a role in #1 above.  Will send urine for culture and treat based on results to see if this helps with her urgency/frequency, however we discussed that she may be chronically colonized, in which case she would not require long-term antibiotic therapy.  Will reassess her symptoms after antibiotics at her next office visit.  Return in about 4 weeks (around 03/18/2023) for Will call with results, Symptom recheck with CATH UA.  Carman Ching, PA-C  [facility] [address] [place], [place] [address] [phone]

## 2023-02-18 NOTE — Progress Notes (Signed)
In and Out Catheterization  Patient is present today for a I & O catheterization due to UA. Patient was cleaned and prepped in a sterile fashion with betadine . A 14FR cath was inserted no complications were noted , 300 ml of urine return was noted, urine was slightly cloudy, yellow in color. A clean urine sample was collected for UA. Bladder was drained  And catheter was removed with out difficulty.    Performed by: Randa Lynn, RMA

## 2023-02-21 ENCOUNTER — Other Ambulatory Visit: Payer: Self-pay

## 2023-02-21 DIAGNOSIS — R8271 Bacteriuria: Secondary | ICD-10-CM

## 2023-02-21 LAB — CULTURE, URINE COMPREHENSIVE

## 2023-02-21 MED ORDER — CEFUROXIME AXETIL 250 MG PO TABS: 250.0000 mg | ORAL_TABLET | Freq: Two times a day (BID) | ORAL | 0 refills | Status: AC

## 2023-03-18 ENCOUNTER — Ambulatory Visit (INDEPENDENT_AMBULATORY_CARE_PROVIDER_SITE_OTHER): Payer: Medicare Other | Admitting: Physician Assistant

## 2023-03-18 ENCOUNTER — Encounter: Payer: Self-pay | Admitting: Physician Assistant

## 2023-03-18 VITALS — BP 136/79 | HR 88 | Ht 62.0 in | Wt 170.0 lb

## 2023-03-18 DIAGNOSIS — N3281 Overactive bladder: Secondary | ICD-10-CM

## 2023-03-18 DIAGNOSIS — R8271 Bacteriuria: Secondary | ICD-10-CM

## 2023-03-18 LAB — MICROSCOPIC EXAMINATION

## 2023-03-18 LAB — URINALYSIS, COMPLETE
Bilirubin, UA: NEGATIVE
Glucose, UA: NEGATIVE
Ketones, UA: NEGATIVE
Leukocytes,UA: NEGATIVE
Nitrite, UA: NEGATIVE
Protein,UA: NEGATIVE
RBC, UA: NEGATIVE
Specific Gravity, UA: 1.015 (ref 1.005–1.030)
Urobilinogen, Ur: 0.2 mg/dL (ref 0.2–1.0)
pH, UA: 7 (ref 5.0–7.5)

## 2023-03-18 MED ORDER — TROSPIUM CHLORIDE ER 60 MG PO CP24: 1.0000 | ORAL_CAPSULE | Freq: Every day | ORAL | 11 refills | Status: AC

## 2023-03-18 NOTE — Progress Notes (Signed)
03/18/2023 2:22 PM   [name] [birth year] [id number]  CC: Chief Complaint  Patient presents with   Over Active Bladder   HPI: [name] is a 72 y.o. female with PMH OAB who previously failed Myrbetriq and bacteriuria of unclear significance who presents today for follow-up on trospium ER 60 mg and after completing cefuroxime to 50 mg twice daily x 7 days.   Today she reports slight improvement in daytime frequency, now approximately every hour.  She denies urinary incontinence or nocturia.  She has been tolerating the trospium without constipation, dry mouth, or dry eye.  She denies dysuria or gross hematuria.  She remains hesitant to pursue third line therapies including PTNS or intravesical Botox.  In-office UA and microscopy today pan negative.  Measured residual 220 mL, last void approximately 1 hour prior.  PMH: Past Medical History:  Diagnosis Date   Abnormal EKG    PREOP EKG -NSR, CANNOT RULE OUT INFERIOR INFARCT AGE -INDETERMIINATE--FOLLOWED UP BY PT'S MEDICAL DOCTOR WITH ECHOCARDIOGRAM AND PT CLEARED FOR LEFT TOTAL KNEE ARTHROPLASTY SURGERY.   Anxiety    Arthritis    Depression    Fibromyalgia    GERD (gastroesophageal reflux disease)    Glaucoma    Hyperlipidemia    Hypertension    IBS (irritable bowel syndrome)    Incontinence of urine    PONV (postoperative nausea and vomiting)    Shortness of breath    WITH EXERTION   Umbilical hernia    NO PAIN    Surgical History: Past Surgical History:  Procedure Laterality Date   ABDOMINAL HYSTERECTOMY  1990   APPENDECTOMY  1955   CHOLECYSTECTOMY  2007   EYE SURGERY     LASER EYE SURGERY -LEFT FOR TX GLAUCOMA   JOINT REPLACEMENT     LEFT TOTAL HIP REPLACEMENT 1998,  RIGHT TOTAL KNEE REPLACEMENT OCT 2012   LAPAROSCOPY TO REMOVE SCAR TISSUE  1991   LEFT KNEE ARTHROSCOPY 2012     RT SHOULDER ARTHROSCOPY  2011     TONSILLECTOMY     TOTAL KNEE ARTHROPLASTY  02/20/2012   Procedure: TOTAL KNEE  ARTHROPLASTY;  Surgeon: Shelda Pal, MD;  Location: [facility];  Service: Orthopedics;  Laterality: Left;   TRANSVAGINAL TAPE  2000    Home Medications:  Allergies as of 03/18/2023       Reactions   Erythromycin Other (See Comments)   Upset stomach   Methocarbamol Nausea Only   Sulfa Antibiotics Other (See Comments)   Turns eyes red        Medication List        Accurate as of March 18, 2023  2:22 PM. If you have any questions, ask your nurse or doctor.          acetaminophen 325 MG tablet Commonly known as: TYLENOL Take 325 mg by mouth every 4 (four) hours as needed.   ascorbic acid 500 MG tablet Commonly known as: VITAMIN C Take 500 mg by mouth daily.   buPROPion 100 MG tablet Commonly known as: WELLBUTRIN Take 100 mg by mouth daily.   cholecalciferol 25 MCG (1000 UNIT) tablet Commonly known as: VITAMIN D3 Take 1,000 Units by mouth daily.   clonazePAM 0.5 MG tablet Commonly known as: KLONOPIN Take 1 mg by mouth at bedtime.   Colace 100 MG capsule Generic drug: docusate sodium Take 100 mg by mouth daily.   estradiol 0.1 MG/24HR patch Commonly known as: VIVELLE-DOT Place 1 patch onto the skin 2 (  two) times a week.   Fish Oil 300 MG Caps Take by mouth.   fluticasone 50 MCG/ACT nasal spray Commonly known as: FLONASE Place 1 spray into both nostrils daily as needed for allergies or rhinitis.   folic acid 400 MCG tablet Commonly known as: FOLVITE Take 400 mcg by mouth daily.   furosemide 20 MG tablet Commonly known as: LASIX Take 20 mg by mouth.   Lidocaine Pain Relief 4 % Generic drug: lidocaine Place 1 patch onto the skin daily.   lisinopril 10 MG tablet Commonly known as: ZESTRIL Take 10 mg by mouth daily.   metoCLOPramide 10 MG tablet Commonly known as: REGLAN Take 1 tablet (10 mg total) by mouth every 8 (eight) hours as needed for nausea or vomiting.   omeprazole 20 MG capsule Commonly known as: PRILOSEC Take 20 mg by mouth  daily.   ondansetron 4 MG disintegrating tablet Commonly known as: ZOFRAN-ODT Take 1 tablet (4 mg total) by mouth every 8 (eight) hours as needed for nausea or vomiting.   potassium chloride SA 20 MEQ tablet Commonly known as: KLOR-CON M Take 20 mEq by mouth 2 (two) times daily.   pravastatin 80 MG tablet Commonly known as: PRAVACHOL Take 80 mg by mouth daily.   sertraline 100 MG tablet Commonly known as: ZOLOFT Take 100 mg by mouth daily.   traMADol 50 MG tablet Commonly known as: Ultram Take 1 tablet (50 mg total) by mouth every 6 (six) hours as needed.   Trospium Chloride 60 MG Cp24 Take 1 capsule (60 mg total) by mouth daily.        Allergies:  Allergies  Allergen Reactions   Erythromycin Other (See Comments)    Upset stomach   Methocarbamol Nausea Only   Sulfa Antibiotics Other (See Comments)    Turns eyes red    Family History: Family History  Problem Relation Age of Onset   Bone cancer Mother    Arthritis Mother    Cancer Mother    Parkinsonism Mother    Arthritis Father    Stroke Father    Diabetes Father    Breast cancer Daughter    Stroke Sister    Parkinsonism Brother    Diabetes Brother     Social History:   reports that she has quit smoking. Her smoking use included cigarettes. She has never used smokeless tobacco. She reports that she does not drink alcohol and does not use drugs.  Physical Exam: BP 136/79   Pulse 88   Ht 5\' 2"  (1.575 m)   Wt 170 lb (77.1 kg)   BMI 31.09 kg/m   Constitutional:  Alert and oriented, no acute distress, nontoxic appearing HEENT: [place], AT Cardiovascular: No clubbing, cyanosis, or edema Respiratory: Normal respiratory effort, no increased work of breathing Skin: No rashes, bruises or suspicious lesions Neurologic: Grossly intact, no focal deficits, moving all 4 extremities Psychiatric: Normal mood and affect  Laboratory Data: Results for orders placed or performed in visit on 03/18/23  Microscopic  Examination   Urine  Result Value Ref Range   WBC, UA 0-5 0 - 5 /hpf   RBC, Urine 0-2 0 - 2 /hpf   Epithelial Cells (non renal) 0-10 0 - 10 /hpf   Bacteria, UA Few None seen/Few  Urinalysis, Complete  Result Value Ref Range   Specific Gravity, UA 1.015 1.005 - 1.030   pH, UA 7.0 5.0 - 7.5   Color, UA Yellow Yellow   Appearance Ur Clear Clear  Leukocytes,UA Negative Negative   Protein,UA Negative Negative/Trace   Glucose, UA Negative Negative   Ketones, UA Negative Negative   RBC, UA Negative Negative   Bilirubin, UA Negative Negative   Urobilinogen, Ur 0.2 0.2 - 1.0 mg/dL   Nitrite, UA Negative Negative   Microscopic Examination See below:    In and Out Catheterization  Patient is present today for a I & O catheterization due to bacteriuria. Patient was cleaned and prepped in a sterile fashion with betadine . A 14FR cath was inserted no complications were noted , of urine return was noted, urine was yellow in color. A clean urine sample was collected for UA. Bladder was drained and catheter was removed without difficulty.    Performed by: Carman Ching, PA-C and Ples Specter, CMA  Assessment & Plan:   1. OAB (overactive bladder) Slight improvement in urinary frequency on trospium, which she appears to be tolerating well without anticholinergic side effects.  Will plan to continue this.  She wishes to defer third line therapies, which is reasonable.  May consider combining this with PTNS in the future per patient desire. - Trospium Chloride 60 MG CP24; Take 1 capsule (60 mg total) by mouth daily.  Dispense: 30 capsule; Refill: 11  2. Bacteriuria UA today is bland. I suspect if she were colonized, her UA would have returned to baseline by now, so I am more suspicious of acute cystitis at her last visit.  She denies irritative voiding symptoms today.  Will continue to monitor. - Urinalysis, Complete   Return in about 1 year (around 03/17/2024) for Annual OAB f/u  with [facility].  Carman Ching, PA-C  [facility]  [address] [place], [place] [address] [phone]

## 2023-08-09 ENCOUNTER — Other Ambulatory Visit: Payer: Self-pay

## 2023-08-09 ENCOUNTER — Emergency Department

## 2023-08-09 ENCOUNTER — Inpatient Hospital Stay
Admission: EM | Admit: 2023-08-09 | Discharge: 2023-08-15 | DRG: 536 | Disposition: A | Discharge status: Skilled Nursing Facility | Source: Skilled Nursing Facility | Attending: Osteopathic Medicine | Admitting: Osteopathic Medicine

## 2023-08-09 ENCOUNTER — Encounter: Payer: Self-pay | Admitting: *Deleted

## 2023-08-09 DIAGNOSIS — H409 Unspecified glaucoma: Secondary | ICD-10-CM | POA: Diagnosis present

## 2023-08-09 DIAGNOSIS — Z90710 Acquired absence of both cervix and uterus: Secondary | ICD-10-CM

## 2023-08-09 DIAGNOSIS — N179 Acute kidney failure, unspecified: Secondary | ICD-10-CM | POA: Diagnosis not present

## 2023-08-09 DIAGNOSIS — K219 Gastro-esophageal reflux disease without esophagitis: Secondary | ICD-10-CM | POA: Diagnosis present

## 2023-08-09 DIAGNOSIS — Y92099 Unspecified place in other non-institutional residence as the place of occurrence of the external cause: Secondary | ICD-10-CM

## 2023-08-09 DIAGNOSIS — S72002A Fracture of unspecified part of neck of left femur, initial encounter for closed fracture: Principal | ICD-10-CM | POA: Diagnosis present

## 2023-08-09 DIAGNOSIS — M79605 Pain in left leg: Secondary | ICD-10-CM

## 2023-08-09 DIAGNOSIS — Z87891 Personal history of nicotine dependence: Secondary | ICD-10-CM

## 2023-08-09 DIAGNOSIS — M1990 Unspecified osteoarthritis, unspecified site: Secondary | ICD-10-CM | POA: Diagnosis present

## 2023-08-09 DIAGNOSIS — M9702XA Periprosthetic fracture around internal prosthetic left hip joint, initial encounter: Secondary | ICD-10-CM | POA: Diagnosis present

## 2023-08-09 DIAGNOSIS — M8580 Other specified disorders of bone density and structure, unspecified site: Secondary | ICD-10-CM | POA: Diagnosis present

## 2023-08-09 DIAGNOSIS — Z881 Allergy status to other antibiotic agents status: Secondary | ICD-10-CM

## 2023-08-09 DIAGNOSIS — K429 Umbilical hernia without obstruction or gangrene: Secondary | ICD-10-CM | POA: Diagnosis present

## 2023-08-09 DIAGNOSIS — N3000 Acute cystitis without hematuria: Secondary | ICD-10-CM | POA: Diagnosis not present

## 2023-08-09 DIAGNOSIS — Z79899 Other long term (current) drug therapy: Secondary | ICD-10-CM

## 2023-08-09 DIAGNOSIS — R531 Weakness: Secondary | ICD-10-CM

## 2023-08-09 DIAGNOSIS — Z9049 Acquired absence of other specified parts of digestive tract: Secondary | ICD-10-CM

## 2023-08-09 DIAGNOSIS — Z87440 Personal history of urinary (tract) infections: Secondary | ICD-10-CM

## 2023-08-09 DIAGNOSIS — E860 Dehydration: Secondary | ICD-10-CM | POA: Diagnosis present

## 2023-08-09 DIAGNOSIS — M25552 Pain in left hip: Secondary | ICD-10-CM | POA: Diagnosis not present

## 2023-08-09 DIAGNOSIS — I10 Essential (primary) hypertension: Secondary | ICD-10-CM | POA: Diagnosis present

## 2023-08-09 DIAGNOSIS — G8929 Other chronic pain: Secondary | ICD-10-CM | POA: Insufficient documentation

## 2023-08-09 DIAGNOSIS — R413 Other amnesia: Secondary | ICD-10-CM | POA: Insufficient documentation

## 2023-08-09 DIAGNOSIS — F0393 Unspecified dementia, unspecified severity, with mood disturbance: Secondary | ICD-10-CM | POA: Diagnosis present

## 2023-08-09 DIAGNOSIS — Z96651 Presence of right artificial knee joint: Secondary | ICD-10-CM | POA: Diagnosis present

## 2023-08-09 DIAGNOSIS — Z96649 Presence of unspecified artificial hip joint: Secondary | ICD-10-CM

## 2023-08-09 DIAGNOSIS — Z79890 Hormone replacement therapy (postmenopausal): Secondary | ICD-10-CM

## 2023-08-09 DIAGNOSIS — N3281 Overactive bladder: Secondary | ICD-10-CM | POA: Diagnosis present

## 2023-08-09 DIAGNOSIS — R296 Repeated falls: Secondary | ICD-10-CM | POA: Diagnosis present

## 2023-08-09 DIAGNOSIS — F32A Depression, unspecified: Secondary | ICD-10-CM | POA: Diagnosis present

## 2023-08-09 DIAGNOSIS — F0394 Unspecified dementia, unspecified severity, with anxiety: Secondary | ICD-10-CM | POA: Diagnosis present

## 2023-08-09 DIAGNOSIS — R5381 Other malaise: Secondary | ICD-10-CM | POA: Diagnosis present

## 2023-08-09 DIAGNOSIS — W19XXXA Unspecified fall, initial encounter: Principal | ICD-10-CM | POA: Diagnosis present

## 2023-08-09 DIAGNOSIS — E119 Type 2 diabetes mellitus without complications: Secondary | ICD-10-CM | POA: Diagnosis present

## 2023-08-09 DIAGNOSIS — Z888 Allergy status to other drugs, medicaments and biological substances status: Secondary | ICD-10-CM

## 2023-08-09 DIAGNOSIS — Z8261 Family history of arthritis: Secondary | ICD-10-CM

## 2023-08-09 DIAGNOSIS — N390 Urinary tract infection, site not specified: Secondary | ICD-10-CM

## 2023-08-09 DIAGNOSIS — M797 Fibromyalgia: Secondary | ICD-10-CM | POA: Diagnosis present

## 2023-08-09 DIAGNOSIS — W010XXA Fall on same level from slipping, tripping and stumbling without subsequent striking against object, initial encounter: Secondary | ICD-10-CM | POA: Diagnosis present

## 2023-08-09 DIAGNOSIS — F419 Anxiety disorder, unspecified: Secondary | ICD-10-CM | POA: Diagnosis not present

## 2023-08-09 DIAGNOSIS — K589 Irritable bowel syndrome without diarrhea: Secondary | ICD-10-CM | POA: Diagnosis present

## 2023-08-09 DIAGNOSIS — Z882 Allergy status to sulfonamides status: Secondary | ICD-10-CM

## 2023-08-09 DIAGNOSIS — E785 Hyperlipidemia, unspecified: Secondary | ICD-10-CM | POA: Diagnosis present

## 2023-08-09 DIAGNOSIS — N3001 Acute cystitis with hematuria: Secondary | ICD-10-CM | POA: Diagnosis present

## 2023-08-09 LAB — CBC
HCT: 37.7 % (ref 36.0–46.0)
Hemoglobin: 12.6 g/dL (ref 12.0–15.0)
MCH: 30.2 pg (ref 26.0–34.0)
MCHC: 33.4 g/dL (ref 30.0–36.0)
MCV: 90.4 fL (ref 80.0–100.0)
Platelets: 190 10*3/uL (ref 150–400)
RBC: 4.17 MIL/uL (ref 3.87–5.11)
RDW: 12.5 % (ref 11.5–15.5)
WBC: 6.2 10*3/uL (ref 4.0–10.5)
nRBC: 0 % (ref 0.0–0.2)

## 2023-08-09 LAB — URINALYSIS, W/ REFLEX TO CULTURE (INFECTION SUSPECTED)
Bilirubin Urine: NEGATIVE
Glucose, UA: NEGATIVE mg/dL
Hgb urine dipstick: NEGATIVE
Ketones, ur: NEGATIVE mg/dL
Nitrite: NEGATIVE
Protein, ur: NEGATIVE mg/dL
Specific Gravity, Urine: 1.009 (ref 1.005–1.030)
Squamous Epithelial / HPF: 0 /HPF (ref 0–5)
pH: 6 (ref 5.0–8.0)

## 2023-08-09 LAB — BASIC METABOLIC PANEL WITH GFR
Anion gap: 8 (ref 5–15)
BUN: 23 mg/dL (ref 8–23)
CO2: 24 mmol/L (ref 22–32)
Calcium: 9.3 mg/dL (ref 8.9–10.3)
Chloride: 104 mmol/L (ref 98–111)
Creatinine, Ser: 1.16 mg/dL — ABNORMAL HIGH (ref 0.44–1.00)
GFR, Estimated: 49 mL/min — ABNORMAL LOW (ref >60)
Glucose, Bld: 103 mg/dL — ABNORMAL HIGH (ref 70–99)
Potassium: 4.8 mmol/L (ref 3.5–5.1)
Sodium: 136 mmol/L (ref 135–145)

## 2023-08-09 LAB — TROPONIN I (HIGH SENSITIVITY): Troponin I (High Sensitivity): 5 ng/L (ref <18)

## 2023-08-09 MED ORDER — ENOXAPARIN SODIUM 40 MG/0.4ML IJ SOSY
40.0000 mg | PREFILLED_SYRINGE | INTRAMUSCULAR | Status: DC
  Administered 2023-08-09 – 2023-08-14 (×6): 40 mg via SUBCUTANEOUS
  Filled 2023-08-09 (×6): qty 0.4

## 2023-08-09 MED ORDER — DOCUSATE SODIUM 100 MG PO CAPS
100.0000 mg | ORAL_CAPSULE | Freq: Every day | ORAL | Status: DC
  Administered 2023-08-10 – 2023-08-15 (×6): 100 mg via ORAL
  Filled 2023-08-09 (×6): qty 1

## 2023-08-09 MED ORDER — OXYCODONE HCL 5 MG PO TABS: 5.0000 mg | ORAL_TABLET | Freq: Four times a day (QID) | ORAL | Status: DC | PRN

## 2023-08-09 MED ORDER — SODIUM CHLORIDE 0.9 % IV SOLN: 2.0000 g | INTRAVENOUS | Status: DC

## 2023-08-09 MED ORDER — SODIUM CHLORIDE 0.9 % IV SOLN
1.0000 g | INTRAVENOUS | Status: DC
  Administered 2023-08-10 – 2023-08-12 (×3): 1 g via INTRAVENOUS
  Filled 2023-08-09 (×4): qty 10

## 2023-08-09 MED ORDER — TRAMADOL HCL 50 MG PO TABS: 50.0000 mg | ORAL_TABLET | Freq: Three times a day (TID) | ORAL | Status: DC | PRN

## 2023-08-09 MED ORDER — POLYETHYLENE GLYCOL 3350 17 G PO PACK
17.0000 g | PACK | Freq: Every day | ORAL | Status: DC | PRN
  Administered 2023-08-15: 17 g via ORAL
  Filled 2023-08-09: qty 1

## 2023-08-09 MED ORDER — SODIUM CHLORIDE 0.9 % IV BOLUS
1000.0000 mL | Freq: Once | INTRAVENOUS | Status: AC
  Administered 2023-08-09: 1000 mL via INTRAVENOUS

## 2023-08-09 MED ORDER — FENTANYL CITRATE PF 50 MCG/ML IJ SOSY
50.0000 ug | PREFILLED_SYRINGE | Freq: Once | INTRAMUSCULAR | Status: AC
  Administered 2023-08-09: 50 ug via INTRAVENOUS
  Filled 2023-08-09: qty 1

## 2023-08-09 MED ORDER — ACETAMINOPHEN 650 MG RE SUPP: 650.0000 mg | Freq: Four times a day (QID) | RECTAL | Status: DC | PRN

## 2023-08-09 MED ORDER — SERTRALINE HCL 50 MG PO TABS
150.0000 mg | ORAL_TABLET | Freq: Every day | ORAL | Status: DC
  Administered 2023-08-10 – 2023-08-15 (×6): 150 mg via ORAL
  Filled 2023-08-09 (×6): qty 3

## 2023-08-09 MED ORDER — BUPROPION HCL 100 MG PO TABS
100.0000 mg | ORAL_TABLET | Freq: Two times a day (BID) | ORAL | Status: DC
  Administered 2023-08-10 – 2023-08-15 (×11): 100 mg via ORAL
  Filled 2023-08-09 (×12): qty 1

## 2023-08-09 MED ORDER — PRAVASTATIN SODIUM 20 MG PO TABS: 80.0000 mg | ORAL_TABLET | Freq: Every day | ORAL | Status: DC

## 2023-08-09 MED ORDER — HYDROMORPHONE HCL 1 MG/ML IJ SOLN: 0.5000 mg | INTRAMUSCULAR | Status: DC | PRN

## 2023-08-09 MED ORDER — ONDANSETRON HCL 4 MG PO TABS
4.0000 mg | ORAL_TABLET | Freq: Four times a day (QID) | ORAL | Status: DC | PRN
  Administered 2023-08-13: 4 mg via ORAL
  Filled 2023-08-09: qty 1

## 2023-08-09 MED ORDER — ONDANSETRON HCL 4 MG/2ML IJ SOLN
4.0000 mg | Freq: Four times a day (QID) | INTRAMUSCULAR | Status: DC | PRN
Start: 2023-08-09 — End: 2023-08-15

## 2023-08-09 MED ORDER — ACETAMINOPHEN 325 MG PO TABS: 650.0000 mg | ORAL_TABLET | Freq: Four times a day (QID) | ORAL | Status: DC | PRN

## 2023-08-09 MED ORDER — CLONAZEPAM 0.5 MG PO TABS
0.5000 mg | ORAL_TABLET | Freq: Every day | ORAL | Status: DC
  Administered 2023-08-09 – 2023-08-14 (×6): 0.5 mg via ORAL
  Filled 2023-08-09 (×6): qty 1

## 2023-08-09 MED ORDER — SODIUM CHLORIDE 0.9 % IV SOLN
2.0000 g | Freq: Once | INTRAVENOUS | Status: AC
  Administered 2023-08-09: 2 g via INTRAVENOUS
  Filled 2023-08-09: qty 20

## 2023-08-09 MED ORDER — SODIUM CHLORIDE 0.9% FLUSH
3.0000 mL | Freq: Two times a day (BID) | INTRAVENOUS | Status: DC
  Administered 2023-08-09 – 2023-08-15 (×12): 3 mL via INTRAVENOUS

## 2023-08-09 MED ORDER — TROSPIUM CHLORIDE ER 60 MG PO CP24: 1.0000 | ORAL_CAPSULE | Freq: Every day | ORAL | Status: DC

## 2023-08-09 NOTE — Assessment & Plan Note (Addendum)
 Patient is presenting with a unwitnessed ground-level fall and persistent generalized weakness.  Workup overall has been reassuring with only abnormality being her UA concerning for UTI and mild AKI.  Patient denies any urinary symptoms, however a poor historian.  She is also experiencing some left hip pain due to the fall, however no evidence of fracture on imaging  - PT/OT

## 2023-08-09 NOTE — Assessment & Plan Note (Signed)
 As noted above, UA concerning for acute UTI.  - Continue Rocephin - Urine culture pending

## 2023-08-09 NOTE — Assessment & Plan Note (Signed)
 Continue home tramadol

## 2023-08-09 NOTE — ED Notes (Signed)
 Pt called out. She wanted the lady nurse

## 2023-08-09 NOTE — Assessment & Plan Note (Signed)
 Hold blood pressure medications today.  Last blood pressure normal range.

## 2023-08-09 NOTE — ED Notes (Signed)
 Patient taken to imaging.

## 2023-08-09 NOTE — ED Notes (Signed)
 Report given to [facility]

## 2023-08-09 NOTE — ED Notes (Signed)
Patient is back from imaging. 

## 2023-08-09 NOTE — ED Notes (Signed)
 Per Dr. Dodson Freestone, patient was stood up at bedside. Patient needed two full-assists to stand. Patient was tremulous. Patient was able to take a couple of steps with left foot, but left leg buckled. Dr. Dodson Freestone aware.

## 2023-08-09 NOTE — Assessment & Plan Note (Signed)
 Continue home bupropion, sertraline, Klonopin.

## 2023-08-09 NOTE — ED Notes (Signed)
 Call was made to [facility] and spoke to [facility], who stated that patient was independent with walker and could ambulate to the bathroom.

## 2023-08-09 NOTE — Assessment & Plan Note (Signed)
 Mild elevation in creatinine with no reported poor p.o. intake, although likely dehydration related.  - S/p 1 L bolus - Repeat BMP in the a.m. - Hold nephrotoxic agents

## 2023-08-09 NOTE — ED Notes (Signed)
 Patient was assisted to stand  with two-person full assist. Patient was able to maintain her posture, but c/o she couldn't move her left leg. Dr. Dodson Freestone was brought into the room. Patient again stood up with two-person assist with a bit more ease and could hold herself upright with a walker,  but stated again she couldn't move her left leg. Patient was placed back on the stretcher. Patient had been incontinent of urine. Patient's pants had been cut by EMT and were now taken off and discarded along with patient's brief. Patient was cleaned up, placed in a new brief, placed in a gown, linens were changed. Patient was repositioned on stretcher with two staff members. Patient tolerated procedure well. No breakdown was noted on skin.  Dr. Dodson Freestone stated that patient could drink.

## 2023-08-09 NOTE — ED Provider Notes (Signed)
 [facility] Provider Note    Event Date/Time   First MD Initiated Contact with Patient 08/09/23 1148     (approximate)   History   Fall   HPI  [name] is a 73 y.o. female past medical history significant for dementia, diabetes, hypertension, GERD, fibromyalgia, anxiety presents to the emergency department after having an unwitnessed fall.  Complaining of left hip pain.  Fall occurred last night at [facility].  Patient does not recall the events that caused her to fall.  Normally ambulates with a walker.  Not on anticoagulation.  Denies hitting her head or any neck pain.  Denies chest pain or shortness of breath.  Denies nausea, vomiting.  Denies cough or shortness of breath.  Pain to her left leg.  States that she has been having difficulty ambulating since that time.  Denies any nausea, vomiting.  No fever.     Physical Exam   Triage Vital Signs: ED Triage Vitals  Encounter Vitals Group     BP      Systolic BP Percentile      Diastolic BP Percentile      Pulse      Resp      Temp      Temp src      SpO2      Weight      Height      Head Circumference      Peak Flow      Pain Score      Pain Loc      Pain Education      Exclude from Growth Chart     Most recent vital signs: Vitals:   08/09/23 1330 08/09/23 1400  BP:  109/65  Pulse: 85   Resp: 16   Temp:    SpO2:      Physical Exam Constitutional:      Appearance: She is well-developed.  HENT:     Head: Atraumatic.  Eyes:     Conjunctiva/sclera: Conjunctivae normal.  Cardiovascular:     Rate and Rhythm: Regular rhythm.  Pulmonary:     Effort: No respiratory distress.  Abdominal:     General: There is no distension.  Genitourinary:    Comments: Multiple attempts to stand, unable to bear weight on her left leg.  Requiring two-person assist in order to stand. Musculoskeletal:        General: Normal range of motion.     Cervical back: Normal range of motion. No  tenderness.     Comments: Tenderness to palpation to the left hip and left femur.  Skin:    General: Skin is warm.  Neurological:     Mental Status: She is alert. Mental status is at baseline.  Psychiatric:        Mood and Affect: Mood normal.     IMPRESSION / MDM / ASSESSMENT AND PLAN / ED COURSE  I reviewed the triage vital signs and the nursing notes.  Differential diagnosis including intracranial hemorrhage, ACS, electrolyte abnormality, urinary tract infection, fracture, dislocation  Plan for lab work, screening EKG and troponin.  Will obtain an x-ray of the left hip and femur.  Given fentanyl for pain control.  EKG  I, Viviano Ground, the attending physician, personally viewed and interpreted this ECG.   Rate: Normal  Rhythm: Normal sinus  Axis: Normal  Intervals: Normal  ST&T Change: None  No tachycardic or bradycardic dysrhythmias while on cardiac telemetry.  RADIOLOGY I independently reviewed  imaging, my interpretation of imaging: X-ray of the left hip -for hip replacement no obvious fracture  X-ray left femur -no obvious fracture  CT scan of the head -no intracranial hemorrhage  CT scan of the pelvis without an obvious fracture.  Noted that that cannot exclude nondisplaced fracture of the proximal left femur due to artifact from the metallic hardware.  Discussed with the radiologist and he did not have any further images to recommend but stated that she may need a repeat x-ray done in a week to see if she had had any signs of healing that could have been that she had a fracture.  Stated that he reviewed the prior x-rays and he did not see an obvious fracture on the x-ray imaging.  LABS (all labs ordered are listed, but only abnormal results are displayed) Labs interpreted as -    Labs Reviewed  BASIC METABOLIC PANEL WITH GFR - Abnormal; Notable for the following components:      Result Value   Glucose, Bld 103 (*)    Creatinine, Ser 1.16 (*)    GFR, Estimated  49 (*)    All other components within normal limits  URINALYSIS, W/ REFLEX TO CULTURE (INFECTION SUSPECTED) - Abnormal; Notable for the following components:   Color, Urine YELLOW (*)    APPearance HAZY (*)    Leukocytes,Ua LARGE (*)    Bacteria, UA RARE (*)    All other components within normal limits  URINE CULTURE  CBC  TROPONIN I (HIGH SENSITIVITY)     MDM    Patient had an unwitnessed fall.  Normally ambulates with a walker and does most of her own independent living at [facility].  Patient does have a history of dementia.  Multiple attempts to stand the patient in the emergency department with ongoing weakness.  Unable to stand on her own.  This is new when compared to her baseline.  No obvious fracture or dislocation.  Questionable findings of urinary tract infection.  Does appear mildly dehydrated.  Will give IV fluids and IV Rocephin.  Otherwise does not meet criteria for sepsis.  Normal white count.  Afebrile.  Urine was sent for culture.  On chart review patient has had a history of UTI in the past that was sensitive to Rocephin.  Consulted hospitalist for admission for weakness, urinary tract infection and intractable pain in the left leg.  PROCEDURES:  Critical Care performed: No  Procedures  Patient's presentation is most consistent with acute presentation with potential threat to life or bodily function.   MEDICATIONS ORDERED IN ED: Medications  sodium chloride 0.9 % bolus 1,000 mL (has no administration in time range)  cefTRIAXone (ROCEPHIN) 2 g in sodium chloride 0.9 % 100 mL IVPB (has no administration in time range)  fentaNYL (SUBLIMAZE) injection 50 mcg (50 mcg Intravenous Given 08/09/23 1221)    FINAL CLINICAL IMPRESSION(S) / ED DIAGNOSES   Final diagnoses:  Fall, initial encounter  Weakness  Left leg pain  Urinary tract infection with hematuria, site unspecified     Rx / DC Orders   ED Discharge Orders     None        Note:  This  document was prepared using Dragon voice recognition software and may include unintentional dictation errors.   Viviano Ground, MD 08/09/23 1755

## 2023-08-09 NOTE — H&P (Signed)
 History and Physical    Patient: [name] ZOX:[id number] DOB: 05-03-1950 DOA: 08/09/2023 DOS: the patient was seen and examined on 08/09/2023 PCP: [facility]  Patient coming from: ALF/ILF  Chief Complaint:  Chief Complaint  Patient presents with   Fall   HPI: [name] is a 73 y.o. female with medical history significant of Overactive bladder, hypertension, hyperlipidemia, recurrent falls, fibromyalgia, who presents to the ED due to ground-level fall.  [name] states that she does not quite remember what occurred but believes she fell sometime last night.  She is not sure how she fell or how she landed but suspects she may have lost her footing.  Then today, she has been having weakness all over with no focal weakness noted.  She endorses left hip pain.  She denies any dizziness, headache, nausea, vomiting, abdominal pain, urinary symptoms.  ED course: On arrival to the ED, patient was normotensive at 128/62 with heart rate of 84.  She was saturating at 95% on room air.  She was afebrile at 98.5.  Initial workup notable for unremarkable CBC, creatinine 1.16 with GFR 49.  Urinalysis with large leukocytes, rare bacteria and elevated WBC/hpf.  Chest x-ray with no active disease, left femur x-ray with no acute findings, and hip x-ray with no acute findings.  CT of the head with no acute intracranial abnormalities.  CT of the pelvis with postop left hip arthroplasty.  Patient started on IV fluids, Rocephin and fentanyl.  TRH contacted for admission.   Review of Systems: As mentioned in the history of present illness. All other systems reviewed and are negative.  Past Medical History:  Diagnosis Date   Abnormal EKG    PREOP EKG -NSR, CANNOT RULE OUT INFERIOR INFARCT AGE -INDETERMIINATE--FOLLOWED UP BY PT'S MEDICAL DOCTOR WITH ECHOCARDIOGRAM AND PT CLEARED FOR LEFT TOTAL KNEE ARTHROPLASTY SURGERY.   Anxiety    Arthritis    Depression     Fibromyalgia    GERD (gastroesophageal reflux disease)    Glaucoma    Hyperlipidemia    Hypertension    IBS (irritable bowel syndrome)    Incontinence of urine    PONV (postoperative nausea and vomiting)    Shortness of breath    WITH EXERTION   Umbilical hernia    NO PAIN   Past Surgical History:  Procedure Laterality Date   ABDOMINAL HYSTERECTOMY  1990   APPENDECTOMY  1955   CHOLECYSTECTOMY  2007   EYE SURGERY     LASER EYE SURGERY -LEFT FOR TX GLAUCOMA   JOINT REPLACEMENT     LEFT TOTAL HIP REPLACEMENT 1998,  RIGHT TOTAL KNEE REPLACEMENT OCT 2012   LAPAROSCOPY TO REMOVE SCAR TISSUE  1991   LEFT KNEE ARTHROSCOPY 2012     RT SHOULDER ARTHROSCOPY  2011     TONSILLECTOMY     TOTAL KNEE ARTHROPLASTY  02/20/2012   Procedure: TOTAL KNEE ARTHROPLASTY;  Surgeon: Bevin Bucks, MD;  Location: [facility];  Service: Orthopedics;  Laterality: Left;   TRANSVAGINAL TAPE  2000   Social History:  reports that she has quit smoking. Her smoking use included cigarettes. She has never used smokeless tobacco. She reports that she does not drink alcohol and does not use drugs.  Allergies  Allergen Reactions   Erythromycin Other (See Comments)    Upset stomach   Methocarbamol Nausea Only   Prednisone    Sulfa Antibiotics Other (See Comments)    Turns eyes red  Family History  Problem Relation Age of Onset   Bone cancer Mother    Arthritis Mother    Cancer Mother    Parkinsonism Mother    Arthritis Father    Stroke Father    Diabetes Father    Breast cancer Daughter    Stroke Sister    Parkinsonism Brother    Diabetes Brother     Prior to Admission medications   Medication Sig Start Date End Date Taking? Authorizing Provider  acetaminophen (TYLENOL) 325 MG tablet Take 325 mg by mouth every 4 (four) hours as needed. 12/26/22   [provider]  ascorbic acid (VITAMIN C) 500 MG tablet Take 500 mg by mouth daily.    [provider]  buPROPion (WELLBUTRIN) 100 MG  tablet Take 100 mg by mouth daily. 01/16/23   [provider]  cholecalciferol (VITAMIN D3) 25 MCG (1000 UNIT) tablet Take 1,000 Units by mouth daily.    [provider]  clonazePAM (KLONOPIN) 0.5 MG tablet Take 1 mg by mouth at bedtime.     [provider]  docusate sodium (COLACE) 100 MG capsule Take 100 mg by mouth daily.    [provider]  estradiol (VIVELLE-DOT) 0.1 MG/24HR patch Place 1 patch onto the skin 2 (two) times a week. 10/13/19   [provider]  fluticasone (FLONASE) 50 MCG/ACT nasal spray Place 1 spray into both nostrils daily as needed for allergies or rhinitis.    [provider]  folic acid (FOLVITE) 400 MCG tablet Take 400 mcg by mouth daily.    [provider]  furosemide (LASIX) 20 MG tablet Take 20 mg by mouth.    [provider]  LIDOCAINE PAIN RELIEF 4 % Place 1 patch onto the skin daily. 12/30/22   [provider]  lisinopril (ZESTRIL) 10 MG tablet Take 10 mg by mouth daily.    [provider]  metoCLOPramide (REGLAN) 10 MG tablet Take 1 tablet (10 mg total) by mouth every 8 (eight) hours as needed for nausea or vomiting. 10/06/22 10/06/23  Lind Repine, MD  Omega-3 Fatty Acids (FISH OIL) 300 MG CAPS Take by mouth.    [provider]  omeprazole (PRILOSEC) 20 MG capsule Take 20 mg by mouth daily. 10/12/19   [provider]  ondansetron (ZOFRAN-ODT) 4 MG disintegrating tablet Take 1 tablet (4 mg total) by mouth every 8 (eight) hours as needed for nausea or vomiting. 10/10/22   Viviano Ground, MD  potassium chloride SA (KLOR-CON) 20 MEQ tablet Take 20 mEq by mouth 2 (two) times daily. 10/12/19   [provider]  pravastatin (PRAVACHOL) 80 MG tablet Take 80 mg by mouth daily.    [provider]  sertraline (ZOLOFT) 100 MG tablet Take 100 mg by mouth daily.    [provider]  traMADol (ULTRAM) 50 MG tablet Take 1 tablet (50 mg total) by mouth  every 6 (six) hours as needed. 08/21/19   Triplett, Cari B, FNP  Trospium Chloride 60 MG CP24 Take 1 capsule (60 mg total) by mouth daily. 03/18/23   Kathreen Pare, PA-C    Physical Exam: Vitals:   08/09/23 1330 08/09/23 1400 08/09/23 1845 08/09/23 1852  BP:  109/65 126/74 126/74  Pulse: 85  87 91  Resp: 16   16  Temp:      TempSrc:      SpO2:    97%  Weight:      Height:       Physical Exam  Vitals and nursing note reviewed.  Constitutional:      General: She is not in acute distress.    Appearance: She is obese. She is not toxic-appearing.  HENT:     Head: Normocephalic and atraumatic.     Mouth/Throat:     Mouth: Mucous membranes are moist.     Pharynx: Oropharynx is clear.  Eyes:     Conjunctiva/sclera: Conjunctivae normal.     Pupils: Pupils are equal, round, and reactive to light.  Cardiovascular:     Rate and Rhythm: Normal rate and regular rhythm.     Heart sounds: No murmur heard.    No gallop.  Pulmonary:     Effort: Pulmonary effort is normal. No respiratory distress.     Breath sounds: Normal breath sounds. No wheezing, rhonchi or rales.  Abdominal:     General: Bowel sounds are normal. There is no distension.     Palpations: Abdomen is soft.     Tenderness: There is no abdominal tenderness. There is no guarding.  Musculoskeletal:     Right lower leg: No edema.     Left lower leg: No edema.     Comments: Left hip pain with flexion  Skin:    General: Skin is warm and dry.  Neurological:     General: No focal deficit present.     Mental Status: She is alert. Mental status is at baseline.  Psychiatric:        Mood and Affect: Mood normal.        Behavior: Behavior normal.    Data Reviewed: CBC with WBC of 6.2, hemoglobin of 12.6, platelets of 190 BMP with sodium of 136, potassium 4.8, bicarb 24, glucose 103, BUN 23, creatinine 1.16 with GFR 49 Urinalysis with large leukocytes, negative nitrites, rare bacteria, and elevated WBCs/hpf  EKG  personally reviewed.  Sinus rhythm with rate of 82.  No acute ST or T wave changes concerning for acute ischemia.  CT PELVIS WO CONTRAST Result Date: 08/09/2023 CLINICAL DATA:  Trauma EXAM: CT PELVIS WITHOUT CONTRAST TECHNIQUE: Multidetector CT imaging of the pelvis was performed following the standard protocol without intravenous contrast. RADIATION DOSE REDUCTION: This exam was performed according to the departmental dose-optimization program which includes automated exposure control, adjustment of the mA and/or kV according to patient size and/or use of iterative reconstruction technique. COMPARISON:  10/10/2022. FINDINGS: Urinary Tract:  No abnormality visualized. Bowel:  Unremarkable visualized pelvic bowel loops. Vascular/Lymphatic: No pathologically enlarged lymph nodes. Atheromatous calcifications of the aorta and its branches. Reproductive:  Status post hysterectomy. Other: Anterior abdominal periumbilical defect contains omental fat without incarceration or herniated bowel. Musculoskeletal: Postop left hip arthroplasty. Cannot exclude nondisplaced fracture of the proximal left femur due to artifact from the metallic hardware. Degenerative changes are seen. IMPRESSION: 1. Postop left hip arthroplasty. Cannot exclude a nondisplaced fracture of the proximal left femur due to artifact from the metallic hardware. If there is clinical concern for a fracture, recommend follow-up with dedicated left hip radiographs. 2. No other acute abnormalities. 3. Aortic atherosclerosis. Electronically Signed   By: Sydell Eva M.D.   On: 08/09/2023 14:48   CT Head Wo Contrast Result Date: 08/09/2023 CLINICAL DATA:  Head trauma, minor (Age >= 65y). EXAM: CT HEAD WITHOUT CONTRAST TECHNIQUE: Contiguous axial images were obtained from the base of the skull through the vertex without intravenous contrast. RADIATION DOSE REDUCTION: This exam was performed according to the departmental dose-optimization program which  includes automated exposure control, adjustment of the  mA and/or kV according to patient size and/or use of iterative reconstruction technique. COMPARISON:  Head CT 01/02/2022 FINDINGS: Brain: There is no evidence of an acute infarct, intracranial hemorrhage, mass, midline shift, or extra-axial fluid collection. There is mild-to-moderate cerebral atrophy. Patchy cerebral white matter hypodensities may have mildly progressed and are nonspecific but compatible with mild-to-moderate chronic small vessel ischemic disease. 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 Vascular: L5 atherosclerosis at the skull base. No hyperdense vessel. Skull: No acute fracture or suspicious lesion. Sinuses/Orbits: Visualized paranasal sinuses and mastoid air cells are clear. Unremarkable orbits. Other: None. IMPRESSION: 1. No evidence of acute intracranial abnormality. 2. Mild-to-moderate chronic small vessel ischemic disease and cerebral atrophy. Electronically Signed   By: Aundra Lee M.D.   On: 08/09/2023 13:43   DG Chest Portable 1 View Result Date: 08/09/2023 CLINICAL DATA:  Marvell Slider EXAM: PORTABLE CHEST - 1 VIEW COMPARISON:  02/18/2012 FINDINGS: Multiple 2 mm densities project over the right upper lung and mediastinum in a somewhat geometric distribution presumably overlying external artifact. Lungs are otherwise clear. No pneumothorax. Heart size and mediastinal contours are within normal limits. Aortic Atherosclerosis (ICD10-170.0). No effusion. Old right fifth rib fracture deformity.  No acute findings. IMPRESSION: 1. No convincing acute cardiopulmonary disease. Electronically Signed   By: Nicoletta Barrier M.D.   On: 08/09/2023 13:22   DG FEMUR MIN 2 VIEWS LEFT Result Date: 08/09/2023 CLINICAL DATA:  [id number] Fall [id number] EXAM: LEFT FEMUR 2 VIEWS COMPARISON:  10/10/2022 FINDINGS: Left hip arthroplasty components stable in position. Knee arthroplasty components project in expected location, the tibial component incompletely  visualized. No fracture or dislocation. Regional soft tissues unremarkable. IMPRESSION: 1. No acute findings. 2. Left hip and knee arthroplasty components. Electronically Signed   By: Nicoletta Barrier M.D.   On: 08/09/2023 13:20   DG Hip Unilat With Pelvis 2-3 Views Left Result Date: 08/09/2023 CLINICAL DATA:  Marvell Slider EXAM: DG HIP (WITH OR WITHOUT PELVIS) 2-3V LEFT COMPARISON:  08/21/2019 FINDINGS: Left hip arthroplasty components stable in position. No fracture or dislocation. Bony pelvis intact. Mild spondylitic changes in the visualized lower lumbar spine. IMPRESSION: 1. No acute findings. 2. Left hip arthroplasty. Electronically Signed   By: Nicoletta Barrier M.D.   On: 08/09/2023 13:19   There are no new results to review at this time.  Assessment and Plan:  * Generalized weakness Patient is presenting with a unwitnessed ground-level fall and persistent generalized weakness.  Workup overall has been reassuring with only abnormality being her UA concerning for UTI and mild AKI.  Patient denies any urinary symptoms, however a poor historian.  She is also experiencing some left hip pain due to the fall, however no evidence of fracture on imaging  - PT/OT  Urinary tract infection As noted above, UA concerning for acute UTI.  - Continue Rocephin - Urine culture pending  AKI (acute kidney injury) (HCC) Mild elevation in creatinine with no reported poor p.o. intake, although likely dehydration related.  - S/p 1 L bolus - Repeat BMP in the a.m. - Hold nephrotoxic agents  Anxiety and depression - Continue home bupropion, sertraline, Klonopin.  PDMP reviewed and appropriate.  Chronic pain - Continue home tramadol  HTN (hypertension) - Hold home Lasix, lisinopril in the setting of AKI  Advance Care Planning:   Code Status: Full Code   Consults: None  Family Communication: No family at bedside  Severity of Illness: The appropriate patient status for this patient is OBSERVATION. Observation status  is judged to be  reasonable and necessary in order to provide the required intensity of service to ensure the patient's safety. The patient's presenting symptoms, physical exam findings, and initial radiographic and laboratory data in the context of their medical condition is felt to place them at decreased risk for further clinical deterioration. Furthermore, it is anticipated that the patient will be medically stable for discharge from the hospital within 2 midnights of admission.   Author: Avi Body, MD 08/09/2023 8:05 PM  For on call review [email or web address].

## 2023-08-09 NOTE — ED Triage Notes (Signed)
 Per EMT report, patient had an unwitnessed fall last night, but didn't c/o pain when found. Patient c/o left leg pain today. Patient is normally ambulatory with a walker.   Pulse 80 133/80 98% room air

## 2023-08-09 NOTE — ED Notes (Signed)
 Caitlyn NT at bedside for [facility].

## 2023-08-10 DIAGNOSIS — S72002A Fracture of unspecified part of neck of left femur, initial encounter for closed fracture: Secondary | ICD-10-CM | POA: Diagnosis present

## 2023-08-10 DIAGNOSIS — F32A Depression, unspecified: Secondary | ICD-10-CM | POA: Diagnosis present

## 2023-08-10 DIAGNOSIS — G8929 Other chronic pain: Secondary | ICD-10-CM | POA: Diagnosis present

## 2023-08-10 DIAGNOSIS — N3000 Acute cystitis without hematuria: Secondary | ICD-10-CM

## 2023-08-10 DIAGNOSIS — R531 Weakness: Secondary | ICD-10-CM | POA: Diagnosis not present

## 2023-08-10 DIAGNOSIS — N3001 Acute cystitis with hematuria: Secondary | ICD-10-CM | POA: Diagnosis present

## 2023-08-10 DIAGNOSIS — M9702XA Periprosthetic fracture around internal prosthetic left hip joint, initial encounter: Secondary | ICD-10-CM | POA: Diagnosis present

## 2023-08-10 DIAGNOSIS — Z9049 Acquired absence of other specified parts of digestive tract: Secondary | ICD-10-CM | POA: Diagnosis not present

## 2023-08-10 DIAGNOSIS — E785 Hyperlipidemia, unspecified: Secondary | ICD-10-CM | POA: Diagnosis present

## 2023-08-10 DIAGNOSIS — N179 Acute kidney failure, unspecified: Secondary | ICD-10-CM | POA: Diagnosis present

## 2023-08-10 DIAGNOSIS — R5381 Other malaise: Secondary | ICD-10-CM | POA: Diagnosis present

## 2023-08-10 DIAGNOSIS — W010XXA Fall on same level from slipping, tripping and stumbling without subsequent striking against object, initial encounter: Secondary | ICD-10-CM | POA: Diagnosis present

## 2023-08-10 DIAGNOSIS — Z96651 Presence of right artificial knee joint: Secondary | ICD-10-CM | POA: Diagnosis present

## 2023-08-10 DIAGNOSIS — F0393 Unspecified dementia, unspecified severity, with mood disturbance: Secondary | ICD-10-CM | POA: Diagnosis present

## 2023-08-10 DIAGNOSIS — F0394 Unspecified dementia, unspecified severity, with anxiety: Secondary | ICD-10-CM | POA: Diagnosis present

## 2023-08-10 DIAGNOSIS — Z87891 Personal history of nicotine dependence: Secondary | ICD-10-CM | POA: Diagnosis not present

## 2023-08-10 DIAGNOSIS — M8580 Other specified disorders of bone density and structure, unspecified site: Secondary | ICD-10-CM | POA: Diagnosis present

## 2023-08-10 DIAGNOSIS — W19XXXS Unspecified fall, sequela: Secondary | ICD-10-CM

## 2023-08-10 DIAGNOSIS — W19XXXA Unspecified fall, initial encounter: Secondary | ICD-10-CM | POA: Diagnosis present

## 2023-08-10 DIAGNOSIS — E119 Type 2 diabetes mellitus without complications: Secondary | ICD-10-CM | POA: Diagnosis present

## 2023-08-10 DIAGNOSIS — E860 Dehydration: Secondary | ICD-10-CM | POA: Diagnosis present

## 2023-08-10 DIAGNOSIS — Z79899 Other long term (current) drug therapy: Secondary | ICD-10-CM | POA: Diagnosis not present

## 2023-08-10 DIAGNOSIS — Z8261 Family history of arthritis: Secondary | ICD-10-CM | POA: Diagnosis not present

## 2023-08-10 DIAGNOSIS — Y92099 Unspecified place in other non-institutional residence as the place of occurrence of the external cause: Secondary | ICD-10-CM | POA: Diagnosis not present

## 2023-08-10 DIAGNOSIS — M25552 Pain in left hip: Secondary | ICD-10-CM | POA: Diagnosis present

## 2023-08-10 DIAGNOSIS — M797 Fibromyalgia: Secondary | ICD-10-CM | POA: Diagnosis present

## 2023-08-10 DIAGNOSIS — N3281 Overactive bladder: Secondary | ICD-10-CM | POA: Diagnosis present

## 2023-08-10 DIAGNOSIS — Z87440 Personal history of urinary (tract) infections: Secondary | ICD-10-CM | POA: Diagnosis not present

## 2023-08-10 DIAGNOSIS — I10 Essential (primary) hypertension: Secondary | ICD-10-CM | POA: Diagnosis present

## 2023-08-10 DIAGNOSIS — Z90710 Acquired absence of both cervix and uterus: Secondary | ICD-10-CM | POA: Diagnosis not present

## 2023-08-10 DIAGNOSIS — H409 Unspecified glaucoma: Secondary | ICD-10-CM | POA: Diagnosis present

## 2023-08-10 LAB — CBC
HCT: 34.4 % — ABNORMAL LOW (ref 36.0–46.0)
Hemoglobin: 11.5 g/dL — ABNORMAL LOW (ref 12.0–15.0)
MCH: 30.3 pg (ref 26.0–34.0)
MCHC: 33.4 g/dL (ref 30.0–36.0)
MCV: 90.5 fL (ref 80.0–100.0)
Platelets: 194 10*3/uL (ref 150–400)
RBC: 3.8 MIL/uL — ABNORMAL LOW (ref 3.87–5.11)
RDW: 12.4 % (ref 11.5–15.5)
WBC: 6.2 10*3/uL (ref 4.0–10.5)
nRBC: 0 % (ref 0.0–0.2)

## 2023-08-10 LAB — BASIC METABOLIC PANEL WITH GFR
Anion gap: 12 (ref 5–15)
BUN: 18 mg/dL (ref 8–23)
CO2: 20 mmol/L — ABNORMAL LOW (ref 22–32)
Calcium: 8.8 mg/dL — ABNORMAL LOW (ref 8.9–10.3)
Chloride: 103 mmol/L (ref 98–111)
Creatinine, Ser: 1.05 mg/dL — ABNORMAL HIGH (ref 0.44–1.00)
GFR, Estimated: 56 mL/min — ABNORMAL LOW (ref >60)
Glucose, Bld: 81 mg/dL (ref 70–99)
Potassium: 3.9 mmol/L (ref 3.5–5.1)
Sodium: 135 mmol/L (ref 135–145)

## 2023-08-10 LAB — CK: Total CK: 69 U/L (ref 38–234)

## 2023-08-10 MED ORDER — TRAMADOL HCL 50 MG PO TABS
50.0000 mg | ORAL_TABLET | Freq: Three times a day (TID) | ORAL | Status: DC | PRN
  Administered 2023-08-10 – 2023-08-11 (×2): 50 mg via ORAL
  Filled 2023-08-10 (×4): qty 1

## 2023-08-10 MED ORDER — SODIUM CHLORIDE 0.9 % IV BOLUS
500.0000 mL | Freq: Once | INTRAVENOUS | Status: AC
  Administered 2023-08-10: 500 mL via INTRAVENOUS

## 2023-08-10 MED ORDER — SODIUM CHLORIDE 0.9 % IV SOLN: INTRAVENOUS | Status: DC

## 2023-08-10 NOTE — Assessment & Plan Note (Addendum)
 Patient had a fall and is elderly.  She is an unsafe discharge at this point in time.  Patient needed her arms to lift up her left leg with straight leg raise.  She required 2+ assist to stand up.  She has osteopenia on the CT scan.  CT scan could not rule out a fracture.  Case discussed with orthopedic surgery and she may have a microfracture there with her osteopenia.  Recommended following up with a joint specialist.  Continue working with PT.  Patient needs rehab.

## 2023-08-10 NOTE — Progress Notes (Addendum)
 Progress Note   Patient: [name]:[id number] DOB: 07-23-1950 DOA: 08/09/2023     0 DOS: the patient was seen and examined on 08/10/2023   Brief hospital course: 73 y.o. female with medical history significant of Overactive bladder, hypertension, hyperlipidemia, recurrent falls, fibromyalgia, who presents to the ED due to ground-level fall.   [name] states that she does not quite remember what occurred but believes she fell sometime last night.  She is not sure how she fell or how she landed but suspects she may have lost her footing.  Then today, she has been having weakness all over with no focal weakness noted.  She endorses left hip pain.  She denies any dizziness, headache, nausea, vomiting, abdominal pain, urinary symptoms.   ED course: On arrival to the ED, patient was normotensive at 128/62 with heart rate of 84.  She was saturating at 95% on room air.  She was afebrile at 98.5.  Initial workup notable for unremarkable CBC, creatinine 1.16 with GFR 49.  Urinalysis with large leukocytes, rare bacteria and elevated WBC/hpf.  Chest x-ray with no active disease, left femur x-ray with no acute findings, and hip x-ray with no acute findings.  CT of the head with no acute intracranial abnormalities.  CT of the pelvis with postop left hip arthroplasty.  Patient started on IV fluids, Rocephin and fentanyl.  TRH contacted for admission.  4/13.  Physical therapy recommending rehab took 2 people to get her up.  Patient had a fall and feels weak.  Started on Rocephin.  Left hip and pelvis x-ray negative does show a left hip arthroplasty, left femur x-ray negative does show left knee arthroplasty.  CT scan of the pelvis showed postop left hip arthroplasty cannot exclude nondisplaced fracture of the proximal left femur due to artifact from the metallic hardware correlate with left hip radiograph.  Patient is an unsafe discharge home.  Patient is elderly and has osteopenia, had a fall and cannot  rule out a fracture.  Assessment and Plan: * Fall Patient had a fall and is elderly.  She is an unsafe discharge at this point in time.  Patient needed her arms to lift up her left leg with straight leg raise.  She required 2+ assist to stand up.  She has osteopenia on the CT scan.  CT scan could not rule out a fracture.  Case discussed with orthopedic surgery and she may have a microfracture there with her osteopenia.  Recommended following up with a joint specialist.  Continue working with PT.  Patient needs rehab.  Generalized weakness Patient is presenting with a unwitnessed ground-level fall and persistent generalized weakness.  Physical therapy recommending rehab since it took 2 people to get her up.  Will give gentle IV fluids.  Hold statin just in case contributing to weakness.  Acute cystitis without hematuria Follow-up urine culture and continue Rocephin.  Could be contributing to the patient's generalized weakness.  AKI (acute kidney injury) (HCC) Creatinine 1.16 on presentation down to 1.05 with fluid bolus.  Will give another fluid bolus and maintenance fluids.  Baseline creatinine 0.71.  Anxiety and depression Continue home bupropion, sertraline, Klonopin.    Chronic pain Continue home tramadol  HTN (hypertension) Hold blood pressure medications as blood pressure on the lower side and has acute kidney injury.  Hyperlipidemia, unspecified Will give a statin holiday with generalized weakness.        Subjective: Patient stated she lives at [place] home and she had a  fall.  Took 2 people to get her up here in the hospital.  Physical therapy recommending rehab.  Found to have a urinary infection.  Physical Exam: Vitals:   08/10/23 0324 08/10/23 0555 08/10/23 0637 08/10/23 0807  BP: 118/61  (!) 108/50 120/60  Pulse: 85  84 83  Resp: 17  17 18   Temp:  98.5 F (36.9 C)  98.3 F (36.8 C)  TempSrc:    Oral  SpO2: 98%  96% 93%  Weight:      Height:       Physical  Exam HENT:     Head: Normocephalic.     Mouth/Throat:     Pharynx: No oropharyngeal exudate.  Eyes:     General: Lids are normal.     Conjunctiva/sclera: Conjunctivae normal.  Cardiovascular:     Rate and Rhythm: Normal rate and regular rhythm.     Heart sounds: Normal heart sounds, S1 normal and S2 normal.  Pulmonary:     Breath sounds: No decreased breath sounds, wheezing, rhonchi or rales.  Abdominal:     Palpations: Abdomen is soft.     Tenderness: There is no abdominal tenderness.  Musculoskeletal:     Right lower leg: Swelling present.     Left lower leg: Swelling present.  Skin:    General: Skin is warm.     Findings: No rash.  Neurological:     Mental Status: She is alert.     Comments: Patient able to straight leg raise with her left leg with using her arms to help out a little bit.  No problem with straight leg raising with her right leg.     Data Reviewed: Creatinine 1.05, GFR 56, white blood cell count 6.2 hemoglobin 11.5, platelet count 194, CK 69  Family Communication: Patient refused me calling any family members  Disposition: Status is: Changed to inpatient.  Patient is an unsafe discharge secondary to high fall risk and a fall already.  She is elderly and has osteopenia.  It took 2 people to get her to stand up.  Urinary tract infection could be contributing to her weakness.  Statin could be contributing to her weakness. Physical therapy recommending rehab.  TOC consulted.  Planned Discharge Destination: Rehab    Time spent: 28 minutes  Author: Verla Glaze, MD 08/10/2023 4:19 PM  For on call review [email or web address].

## 2023-08-10 NOTE — Assessment & Plan Note (Signed)
 Will give a statin holiday with generalized weakness.

## 2023-08-10 NOTE — Hospital Course (Addendum)
 Hospital course / significant events:   HPI: 73 y.o. female with medical history significant of Overactive bladder, hypertension, hyperlipidemia, recurrent falls, fibromyalgia, who presents to the ED due to ground-level fall.  04/12: admitted to hospitalist, fall, AKI, UTI 04/13: PT recs rehab. CT pelvis showed postop left hip arthroplasty cannot exclude nondisplaced fracture of the proximal left femur due to artifact from the metallic hardware correlate with left hip radiograph.  Patient is an unsafe discharge home.  Patient is elderly and has osteopenia, had a fall and cannot rule out a fracture. 04/14.  Today patient able to straight leg raise without using her arms.  Creatinine down to 0.96.  E. coli growing out of urine culture sensitive to Rocephin . 04/15-04/17: pending SNF placement for rehab 04/18: placement confirmed, stable for discharge      Consultants:  None formal, informal d/w orthopedics see hospital course   Procedures/Surgeries: none      ASSESSMENT & PLAN:   Fall Generalized weakness Debility Patient had a fall and is elderly.  She is an unsafe discharge at this point in time.  Patient needed her arms to lift up her left leg with straight leg raise on 4/13.  She required 2+ assist to stand up on 4/13.  She has osteopenia on the CT scan.  CT scan could not rule out a fracture.  Case discussed with orthopedic surgery and she may have a microfracture there with her osteopenia.  Recommended following up with a joint specialist.  Continue working with PT.  Patient needs rehab - pending placement. Acute cystitis without hematuria likely playing a role in the patient's fall and weakness.   Acute cystitis without hematuria likely one of the major factors in her generalized weakness and fall. E. coli growing out of urine cultures.  Continue Rocephin  x5 days total   AKI (acute kidney injury) (HCC) Creatinine 1.16 on presentation down to 0.96 with fluids.  Baseline creatinine  0.71.   Memory loss Patient is at independent living at [facility] memory care.  Will need to walk in order to go back to that level care.   Anxiety and depression Continue home bupropion , sertraline , Klonopin .     Chronic pain Continue home tramadol    HTN (hypertension) Hold blood pressure medications.  Last blood pressure normal range.   Hyperlipidemia, unspecified statin holiday with generalized weaknes    Class 1 obesity based on BMI: Body mass index is 32.34 kg/m.  Underweight - under 18  overweight - 25 to 29 obese - 30 or more Class 1 obesity: BMI of 30.0 to 34 Class 2 obesity: BMI of 35.0 to 39 Class 3 obesity: BMI of 40.0 to 49 Super Morbid Obesity: BMI 50-59 Super-super Morbid Obesity: BMI 60+ Significantly low or high BMI is associated with higher medical risk.  Weight management advised as adjunct to other disease management and risk reduction treatments    DVT prophylaxis: lovenox   IV fluids: no continuous IV fluids  Nutrition: regular diet Central lines / other devices: none  Code Status: FULL CODE ACP documentation reviewed: none on file in [facility]  [facility] needs: SNF rehab placement Medical barriers to dispo: none.

## 2023-08-10 NOTE — Evaluation (Signed)
 Occupational Therapy Evaluation Patient Details Name: [name] MRN: [id number] DOB: 02/23/1951 Today's Date: 08/10/2023   History of Present Illness   Patient is a 73 year old female with fall, generalized weakness, UA concerning for UTI. L hip pain with imaging negative. History of overactive bladder, hypertension, hyperlipidemia, recurrent falls, fibromyalgia     Clinical Impressions Pt was seen for OT evaluation this date. Prior to hospital admission, pt was at [facility] and reports being indep with RW and indep with ADL. Pt is alert and oriented to self, and situation. Received attempting to answer the phone using the call bell. Pt presents to acute OT demonstrating impaired ADL performance and functional mobility 2/2 impaired cognition, strength, balance, activity tolerance, and L hip pain (See OT problem list for additional functional deficits). Pt currently requires MIN-MOD A for bed mobility, MAX A to stand with RW, unable to take side steps 2/2 L hip pain, and MAX A for LB ADL. Pt would benefit from skilled OT services to address noted impairments and functional limitations (see below for any additional details) in order to maximize safety and independence while minimizing falls risk and caregiver burden.    If plan is discharge home, recommend the following:   A lot of help with walking and/or transfers;A lot of help with bathing/dressing/bathroom;Direct supervision/assist for medications management;Supervision due to cognitive status;Direct supervision/assist for financial management;Assistance with cooking/housework;Assist for transportation;Help with stairs or ramp for entrance     Functional Status Assessment   Patient has had a recent decline in their functional status and demonstrates the ability to make significant improvements in function in a reasonable and predictable amount of time.     Equipment Recommendations   Other (comment) (defer)      Recommendations for Other Services         Precautions/Restrictions   Precautions Precautions: Fall Restrictions Weight Bearing Restrictions Per Provider Order: No     Mobility Bed Mobility Overal bed mobility: Needs Assistance Bed Mobility: Supine to Sit, Sit to Supine     Supine to sit: Min assist, Mod assist Sit to supine: Mod assist        Transfers Overall transfer level: Needs assistance Equipment used: Rolling walker (2 wheels) Transfers: Sit to/from Stand Sit to Stand: Max assist                  Balance Overall balance assessment: Needs assistance Sitting-balance support: Feet supported Sitting balance-Leahy Scale: Good   Postural control: Posterior lean Standing balance support: Bilateral upper extremity supported Standing balance-Leahy Scale: Poor Standing balance comment: external support required with heavy reliance on rolling walker for support. posterior lean                           ADL either performed or assessed with clinical judgement   ADL Overall ADL's : Needs assistance/impaired     Grooming: Sitting;Set up;Supervision/safety;Wash/dry face;Oral care;Wash/dry hands               Lower Body Dressing: Sit to/from stand;Moderate assistance;Maximal assistance                       Vision         Perception         Praxis         Pertinent Vitals/Pain Pain Assessment Pain Assessment: 0-10 Pain Score: 8  Faces Pain Scale: Hurts little more Pain Location: L hip with  movement Pain Descriptors / Indicators: Discomfort, Grimacing Pain Intervention(s): Limited activity within patient's tolerance, Monitored during session, Repositioned, Patient requesting pain meds-RN notified     Extremity/Trunk Assessment Upper Extremity Assessment Upper Extremity Assessment: Generalized weakness   Lower Extremity Assessment Lower Extremity Assessment: Generalized weakness       Communication  Communication Communication: Impaired Factors Affecting Communication: Hearing impaired   Cognition Arousal: Alert Behavior During Therapy: WFL for tasks assessed/performed Cognition: No family/caregiver present to determine baseline             OT - Cognition Comments: Alert and oriented to self and situation, cues for commands, increased processing time, some confusion noted (attempted to answer the phone but picked up the call bell)                 Following commands: Impaired Following commands impaired: Follows one step commands with increased time, Follows multi-step commands inconsistently     Cueing  General Comments   Cueing Techniques: Verbal cues      Exercises     Shoulder Instructions      Home Living Family/patient expects to be discharged to:: Assisted living                             Home Equipment: Rolling Walker (2 wheels)   Additional Comments: Pt reports living at [facility]      Prior Functioning/Environment Prior Level of Function : Needs assist;History of Falls (last six months)             Mobility Comments: Mod I with mobility, walking with 4 wheeled walker ADLs Comments: patient reoprts she manages her own medication. meals are provided    OT Problem List: Decreased strength;Pain;Decreased cognition;Decreased safety awareness;Decreased activity tolerance;Impaired balance (sitting and/or standing);Decreased knowledge of use of DME or AE   OT Treatment/Interventions: Self-care/ADL training;Therapeutic exercise;Therapeutic activities;DME and/or AE instruction;Patient/family education;Balance training      OT Goals(Current goals can be found in the care plan section)   Acute Rehab OT Goals Patient Stated Goal: have less pain OT Goal Formulation: With patient Time For Goal Achievement: 08/24/23 Potential to Achieve Goals: Good ADL Goals Pt Will Perform Lower Body Dressing: sit to/from stand;with min assist Pt  Will Transfer to Toilet: bedside commode;ambulating;with mod assist (LRAD) Pt Will Perform Toileting - Clothing Manipulation and hygiene: sitting/lateral leans;with set-up;with supervision Additional ADL Goal #1: Pt will complete standing grooming task with PRN UE support with CGA for or longer, 2/2 opportunities.   OT Frequency:  Min 2X/week    Co-evaluation              AM-PAC OT "6 Clicks" Daily Activity     Outcome Measure Help from another person eating meals?: None Help from another person taking care of personal grooming?: None Help from another person toileting, which includes using toliet, bedpan, or urinal?: A Lot Help from another person bathing (including washing, rinsing, drying)?: A Lot Help from another person to put on and taking off regular upper body clothing?: A Little Help from another person to put on and taking off regular lower body clothing?: A Lot 6 Click Score: 17   End of Session Equipment Utilized During Treatment: Rolling walker (2 wheels) Nurse Communication: Mobility status  Activity Tolerance: Patient tolerated treatment well Patient left: in bed;with call bell/phone within reach;with bed alarm set;with nursing/sitter in room  OT Visit Diagnosis: Other abnormalities of gait and mobility (R26.89);History of  falling (Z91.81);Muscle weakness (generalized) (M62.81);Pain Pain - Right/Left: Left Pain - part of body: Hip                Time: 1610-9604 OT Time Calculation (min): 18 min Charges:  OT General Charges $OT Visit: 1 Visit OT Evaluation $OT Eval Low Complexity: 1 Low  Berenda Breaker., MPH, MS, OTR/L ascom [phone] 08/10/23, 1:20 PM

## 2023-08-10 NOTE — Evaluation (Signed)
 Physical Therapy Evaluation Patient Details Name: [name] MRN: [id number] DOB: 1950/08/25 Today's Date: 08/10/2023  History of Present Illness  Patient is a 73 year old female with fall, generalized weakness, UA concerning for UTI. L hip pain with imaging negative. History of overactive bladder, hypertension, hyperlipidemia, recurrent falls, fibromyalgia  Clinical Impression  Patient is agreeable to PT evaluation. She needs increased time for command following. She reports she lives in the independent living part of [place]. She is ambulatory with a 4 wheeled walker at baseline and has a history of falls.  Today the patient required assistance with all mobility. Min- Mod A for bed mobility. Max A for standing. Poor standing balance with activity limited by fatigue and transient nausea. Unable to take steps away from the bed  No dizziness reported with mobility. Recommend to continue PT to maximize independence and facilitate return to prior level of function. Rehabilitation < 3 hours/day recommended after this hospital stay.   Orthostatic vitals taken during PT evaluation:  BP- Lying Pulse- Lying BP- Sitting Pulse- Sitting BP- Standing at 0 minutes Pulse- Standing at 0 minutes  08/10/23 0915 118/53 81 135/82 90 137/70 100          If plan is discharge home, recommend the following: A lot of help with bathing/dressing/bathroom;Two people to help with walking and/or transfers;Assist for transportation;Assistance with cooking/housework   Can travel by private vehicle   No    Equipment Recommendations None recommended by PT  Recommendations for Other Services       Functional Status Assessment Patient has had a recent decline in their functional status and demonstrates the ability to make significant improvements in function in a reasonable and predictable amount of time.     Precautions / Restrictions Precautions Precautions: Fall Restrictions Weight Bearing Restrictions  Per Provider Order: No      Mobility  Bed Mobility Overal bed mobility: Needs Assistance Bed Mobility: Supine to Sit, Sit to Supine     Supine to sit: Min assist Sit to supine: Mod assist   General bed mobility comments: assistance for LLE and trunk support. increased assistance required for return to bed. cues for technique    Transfers Overall transfer level: Needs assistance Equipment used: Rolling walker (2 wheels) Transfers: Sit to/from Stand Sit to Stand: Max assist           General transfer comment: lifting and lowering assistance provided. posterior bias with standing. faciliation and cues for anterior weight shifting. no increased pain in the left hip with weight bearing. no dizziness reported but patient does report transient nausea    Ambulation/Gait             Pre-gait activities: standing tolerance of around 1.5 minutes. cues for balance General Gait Details: unable to at this time  Stairs            Wheelchair Mobility     Tilt Bed    Modified Rankin (Stroke Patients Only)       Balance Overall balance assessment: Needs assistance Sitting-balance support: Feet supported Sitting balance-Leahy Scale: Good   Postural control: Posterior lean Standing balance support: Bilateral upper extremity supported Standing balance-Leahy Scale: Poor Standing balance comment: external support required with heavy reliance on rolling walker for support. posterior lean                             Pertinent Vitals/Pain Pain Assessment Pain Assessment: Faces Faces Pain Scale:  Hurts little more Pain Location: L hip Pain Descriptors / Indicators: Discomfort Pain Intervention(s): Limited activity within patient's tolerance, Monitored during session, Repositioned    Home Living Family/patient expects to be discharged to::  (Independent Living at [facility])                        Prior Function Prior Level of Function : Needs  assist;History of Falls (last six months)             Mobility Comments: Mod I with mobility, walking with 4 wheeled walker ADLs Comments: patient reoprts she manages her own medication. meals are provided     Extremity/Trunk Assessment   Upper Extremity Assessment Upper Extremity Assessment: Defer to OT evaluation    Lower Extremity Assessment Lower Extremity Assessment: Generalized weakness (LLE pain in the left hip that did not increase with mobility)       Communication   Communication Communication: No apparent difficulties    Cognition Arousal: Alert Behavior During Therapy: WFL for tasks assessed/performed   PT - Cognitive impairments: No family/caregiver present to determine baseline                       PT - Cognition Comments: Disoriented to time. Patient is a poor historian. Cooperative throughout session. Slow initiation with cues provided with mobility Following commands: Impaired Following commands impaired: Follows one step commands with increased time     Cueing Cueing Techniques: Verbal cues     General Comments General comments (skin integrity, edema, etc.): orthostatic vitals taken. no dizziness reported with mobility    Exercises     Assessment/Plan    PT Assessment Patient needs continued PT services  PT Problem List Decreased strength;Decreased activity tolerance;Decreased range of motion;Decreased balance;Decreased mobility;Decreased cognition;Pain;Decreased safety awareness;Decreased knowledge of precautions       PT Treatment Interventions DME instruction;Stair training;Gait training;Functional mobility training;Therapeutic activities;Balance training;Therapeutic exercise;Neuromuscular re-education;Cognitive remediation;Patient/family education    PT Goals (Current goals can be found in the Care Plan section)  Acute Rehab PT Goals Patient Stated Goal: to feel better PT Goal Formulation: With patient Time For Goal  Achievement: 08/24/23 Potential to Achieve Goals: Good    Frequency Min 2X/week     Co-evaluation               AM-PAC PT "6 Clicks" Mobility  Outcome Measure Help needed turning from your back to your side while in a flat bed without using bedrails?: A Lot Help needed moving from lying on your back to sitting on the side of a flat bed without using bedrails?: A Lot Help needed moving to and from a bed to a chair (including a wheelchair)?: A Lot Help needed standing up from a chair using your arms (e.g., wheelchair or bedside chair)?: A Lot Help needed to walk in hospital room?: Total Help needed climbing 3-5 steps with a railing? : Total 6 Click Score: 10    End of Session   Activity Tolerance: Patient limited by fatigue Patient left: in bed;with call bell/phone within reach;with bed alarm set Nurse Communication: Mobility status PT Visit Diagnosis: Muscle weakness (generalized) (M62.81);Unsteadiness on feet (R26.81)    Time: 1610-9604 PT Time Calculation (min) (ACUTE ONLY): 19 min   Charges:   PT Evaluation $PT Eval Moderate Complexity: 1 Mod PT Treatments $Therapeutic Activity: 8-22 mins PT General Charges $$ ACUTE PT VISIT: 1 Visit         Ozie Bo, PT,  MPT   Erlene Hawks 08/10/2023, 9:44 AM

## 2023-08-10 NOTE — Assessment & Plan Note (Addendum)
 Follow-up urine culture and continue Rocephin.  Could be contributing to the patient's generalized weakness.

## 2023-08-10 NOTE — Care Management Obs Status (Signed)
 MEDICARE OBSERVATION STATUS NOTIFICATION   Patient Details  Name: [name] MRN: [id number] Date of Birth: 1950-11-10   Medicare Observation Status Notification Given:  Rudolph Cost, CMA 08/10/2023, 10:09 AM

## 2023-08-11 DIAGNOSIS — R531 Weakness: Secondary | ICD-10-CM | POA: Diagnosis not present

## 2023-08-11 DIAGNOSIS — G894 Chronic pain syndrome: Secondary | ICD-10-CM

## 2023-08-11 DIAGNOSIS — W19XXXS Unspecified fall, sequela: Secondary | ICD-10-CM | POA: Diagnosis not present

## 2023-08-11 DIAGNOSIS — N179 Acute kidney failure, unspecified: Secondary | ICD-10-CM | POA: Diagnosis not present

## 2023-08-11 DIAGNOSIS — N3000 Acute cystitis without hematuria: Secondary | ICD-10-CM | POA: Diagnosis not present

## 2023-08-11 LAB — CBC
HCT: 31.1 % — ABNORMAL LOW (ref 36.0–46.0)
Hemoglobin: 10.7 g/dL — ABNORMAL LOW (ref 12.0–15.0)
MCH: 31 pg (ref 26.0–34.0)
MCHC: 34.4 g/dL (ref 30.0–36.0)
MCV: 90.1 fL (ref 80.0–100.0)
Platelets: 161 10*3/uL (ref 150–400)
RBC: 3.45 MIL/uL — ABNORMAL LOW (ref 3.87–5.11)
RDW: 12.1 % (ref 11.5–15.5)
WBC: 5.2 10*3/uL (ref 4.0–10.5)
nRBC: 0 % (ref 0.0–0.2)

## 2023-08-11 LAB — URINE CULTURE

## 2023-08-11 LAB — BASIC METABOLIC PANEL WITH GFR
Anion gap: 6 (ref 5–15)
BUN: 18 mg/dL (ref 8–23)
CO2: 24 mmol/L (ref 22–32)
Calcium: 8.8 mg/dL — ABNORMAL LOW (ref 8.9–10.3)
Chloride: 109 mmol/L (ref 98–111)
Creatinine, Ser: 0.96 mg/dL (ref 0.44–1.00)
GFR, Estimated: >60 mL/min (ref >60)
Glucose, Bld: 92 mg/dL (ref 70–99)
Potassium: 3.3 mmol/L — ABNORMAL LOW (ref 3.5–5.1)
Sodium: 139 mmol/L (ref 135–145)

## 2023-08-11 LAB — TSH: TSH: 2.949 u[IU]/mL (ref 0.350–4.500)

## 2023-08-11 MED ORDER — POTASSIUM CHLORIDE CRYS ER 20 MEQ PO TBCR
40.0000 meq | EXTENDED_RELEASE_TABLET | Freq: Once | ORAL | Status: AC
  Administered 2023-08-11: 40 meq via ORAL
  Filled 2023-08-11: qty 2

## 2023-08-11 MED ORDER — FESOTERODINE FUMARATE ER 4 MG PO TB24
4.0000 mg | ORAL_TABLET | Freq: Every day | ORAL | Status: DC
  Administered 2023-08-11 – 2023-08-15 (×5): 4 mg via ORAL
  Filled 2023-08-11 (×5): qty 1

## 2023-08-11 NOTE — Progress Notes (Signed)
 PT Cancellation Note  Patient Details Name: [name] MRN: [id number] DOB: [birth year]   Cancelled Treatment:    Reason Eval/Treat Not Completed: Other (comment): Pt asleep, awakens to therapist voice. Patient refusing to participate in PT services at this time due to fatigue, request therapist to come back. Will re-attempt at later date/time.    Ashwini Jago M Fairly, PT, DPT 08/11/23 12:03 PM

## 2023-08-11 NOTE — Progress Notes (Signed)
 Progress Note   Patient: [name]:[id number] DOB: Mar 16, 1951 DOA: 08/09/2023     1 DOS: the patient was seen and examined on 08/11/2023   Brief hospital course: 73 y.o. female with medical history significant of Overactive bladder, hypertension, hyperlipidemia, recurrent falls, fibromyalgia, who presents to the ED due to ground-level fall.   [name] states that she does not quite remember what occurred but believes she fell sometime last night.  She is not sure how she fell or how she landed but suspects she may have lost her footing.  Then today, she has been having weakness all over with no focal weakness noted.  She endorses left hip pain.  She denies any dizziness, headache, nausea, vomiting, abdominal pain, urinary symptoms.   ED course: On arrival to the ED, patient was normotensive at 128/62 with heart rate of 84.  She was saturating at 95% on room air.  She was afebrile at 98.5.  Initial workup notable for unremarkable CBC, creatinine 1.16 with GFR 49.  Urinalysis with large leukocytes, rare bacteria and elevated WBC/hpf.  Chest x-ray with no active disease, left femur x-ray with no acute findings, and hip x-ray with no acute findings.  CT of the head with no acute intracranial abnormalities.  CT of the pelvis with postop left hip arthroplasty.  Patient started on IV fluids, Rocephin and fentanyl.  TRH contacted for admission.  4/13.  Physical therapy recommending rehab took 2 people to get her up.  Patient had a fall and feels weak.  Started on Rocephin.  Left hip and pelvis x-ray negative does show a left hip arthroplasty, left femur x-ray negative does show left knee arthroplasty.  CT scan of the pelvis showed postop left hip arthroplasty cannot exclude nondisplaced fracture of the proximal left femur due to artifact from the metallic hardware correlate with left hip radiograph.  Patient is an unsafe discharge home.  Patient is elderly and has osteopenia, had a fall and cannot  rule out a fracture. 4/14.  Today patient able to straight leg raise without using her arms.  Creatinine down to 0.96.  E. coli growing out of urine culture sensitive to Rocephin.  Assessment and Plan: * Fall Patient had a fall and is elderly.  She is an unsafe discharge at this point in time.  Patient needed her arms to lift up her left leg with straight leg raise on 4/13.  She required 2+ assist to stand up on 4/13.  She has osteopenia on the CT scan.  CT scan could not rule out a fracture.  Case discussed with orthopedic surgery and she may have a microfracture there with her osteopenia.  Recommended following up with a joint specialist.  Continue working with PT.  Patient needs rehab.  Today patient was able to straight leg raise without using her arm.  Generalized weakness Patient is presenting with a unwitnessed ground-level fall and persistent generalized weakness.  Physical therapy recommending rehab since it took 2 people to get her up on 4/13.  Continue treatment of UTI with Rocephin.  Patient hydrated overnight and will discontinue fluids today.  Hold statin just in case contributing to weakness.  Add on a TSH.  Acute cystitis without hematuria E. coli growing out of urine cultures.  Continue Rocephin.  I believe this is likely one of the major factors in her generalized weakness and fall.  AKI (acute kidney injury) (HCC) Creatinine 1.16 on presentation down to 0.96 with fluids.  Baseline creatinine 0.71.  Anxiety and depression Continue home bupropion, sertraline, Klonopin.    Chronic pain Continue home tramadol  HTN (hypertension) Hold blood pressure medications today.  Last blood pressure normal range.  Hyperlipidemia, unspecified Will give a statin holiday with generalized weakness.        Subjective: Patient still feels generalized weakness.  Today able to straight leg raise without without using her arms.  Physical Exam: Vitals:   08/10/23 1626 08/10/23 2017  08/11/23 0442 08/11/23 0734  BP: (!) 116/56 112/60 116/66 124/60  Pulse: 91 88 81 75  Resp: 20   18  Temp: 98.5 F (36.9 C) 98.4 F (36.9 C) 98.3 F (36.8 C) (!) 97.5 F (36.4 C)  TempSrc: Oral   Oral  SpO2: 96% 99% 94% 95%  Weight:      Height:       Physical Exam HENT:     Head: Normocephalic.     Mouth/Throat:     Pharynx: No oropharyngeal exudate.  Eyes:     General: Lids are normal.     Conjunctiva/sclera: Conjunctivae normal.  Cardiovascular:     Rate and Rhythm: Normal rate and regular rhythm.     Heart sounds: Normal heart sounds, S1 normal and S2 normal.  Pulmonary:     Breath sounds: No decreased breath sounds, wheezing, rhonchi or rales.  Abdominal:     Palpations: Abdomen is soft.     Tenderness: There is no abdominal tenderness.  Musculoskeletal:     Right lower leg: No swelling.     Left lower leg: No swelling.  Skin:    General: Skin is warm.     Findings: No rash.  Neurological:     Mental Status: She is alert.     Comments: Patient able to straight leg raise without using her arms to lift up her leg.     Data Reviewed: Creatinine 0.96, GFR greater than 60, white blood count 5.2, hemoglobin 10.7, platelet count 161 Urine culture growing E. coli sensitive to Rocephin  Family Communication: Refused  Disposition: Status is: Inpatient Remains inpatient appropriate because: Unsafe discharge.  Will need a 3 night stay in inpatient status in order to go to rehab.  Patient is an elderly patient that had a fall and has osteopenia.  It also took 2 people to get her up yesterday.  Planned Discharge Destination: Rehab    Time spent: 28 minutes  Author: Verla Glaze, MD 08/11/2023 12:15 PM  For on call review [email or web address].

## 2023-08-11 NOTE — Progress Notes (Signed)
 Physical Therapy Treatment Patient Details Name: [name] MRN: [id number] DOB: January 30, 1951 Today's Date: 08/11/2023   History of Present Illness Patient is a 73 year old female with fall, generalized weakness, UA concerning for UTI. L hip pain with imaging negative. History of overactive bladder, hypertension, hyperlipidemia, recurrent falls, fibromyalgia    PT Comments  Patient supine in bed, agreeable to participate in PT services. Pt continues to require increased time for command following, able to complete bed mobility with Mod A with assist for LE's and trunk. Patient able to stand from EOB with Max A, able to stand x 2 attempts from EOB, posterior bias noted. Overall poor standing balance, but able to lateral step alongside EOB. Poor foot clearance noted with lateral steps. No dizziness/pain reported. Discharge recommendation remains appropriate. Will continue to follow acutely.     If plan is discharge home, recommend the following: A lot of help with bathing/dressing/bathroom;Two people to help with walking and/or transfers;Assist for transportation;Assistance with cooking/housework   Can travel by private vehicle     No  Equipment Recommendations  None recommended by PT    Recommendations for Other Services       Precautions / Restrictions Precautions Precautions: Fall Restrictions Weight Bearing Restrictions Per Provider Order: No     Mobility  Bed Mobility Overal bed mobility: Needs Assistance Bed Mobility: Supine to Sit, Sit to Supine     Supine to sit: Mod assist Sit to supine: Mod assist   General bed mobility comments: patient with slow movements throughout, increased time, require assist for LLE and at trunk for bed mobility. Mod A.    Transfers Overall transfer level: Needs assistance Equipment used: Rolling walker (2 wheels) Transfers: Sit to/from Stand Sit to Stand: Max assist           General transfer comment: MAX A for lifting and  lowering required from EOB. Noted posterior bias with standing, requring cues and for anterior weight shifting with minimal improvement. Pt with increased fear of falling. Limited standing tolerance. Able to lateral step alongside EOB with use of RW, minimal foot clearance.    Ambulation/Gait                   Stairs             Wheelchair Mobility     Tilt Bed    Modified Rankin (Stroke Patients Only)       Balance Overall balance assessment: Needs assistance Sitting-balance support: Feet supported Sitting balance-Leahy Scale: Good   Postural control: Posterior lean Standing balance support: Bilateral upper extremity supported Standing balance-Leahy Scale: Poor Standing balance comment: external support required with heavy reliance on rolling walker for support. posterior lean, minimal improvement with faciliation/cues.                            Communication Communication Communication: Impaired Factors Affecting Communication: Hearing impaired  Cognition Arousal: Alert Behavior During Therapy: WFL for tasks assessed/performed   PT - Cognitive impairments: No family/caregiver present to determine baseline                         Following commands: Impaired Following commands impaired: Follows one step commands with increased time, Follows multi-step commands inconsistently    Cueing Cueing Techniques: Verbal cues  Exercises      General Comments        Pertinent Vitals/Pain Pain Assessment Pain Assessment: No/denies  pain    Home Living                          Prior Function            PT Goals (current goals can now be found in the care plan section) Acute Rehab PT Goals PT Goal Formulation: With patient Time For Goal Achievement: 08/24/23 Potential to Achieve Goals: Good Progress towards PT goals: Progressing toward goals    Frequency    Min 2X/week      PT Plan      Co-evaluation               AM-PAC PT "6 Clicks" Mobility   Outcome Measure  Help needed turning from your back to your side while in a flat bed without using bedrails?: A Lot Help needed moving from lying on your back to sitting on the side of a flat bed without using bedrails?: A Lot Help needed moving to and from a bed to a chair (including a wheelchair)?: A Lot Help needed standing up from a chair using your arms (e.g., wheelchair or bedside chair)?: A Lot Help needed to walk in hospital room?: Total   6 Click Score: 9    End of Session Equipment Utilized During Treatment: Gait belt Activity Tolerance: Patient limited by fatigue Patient left: in bed;with call bell/phone within reach;with bed alarm set Nurse Communication: Mobility status PT Visit Diagnosis: Muscle weakness (generalized) (M62.81);Unsteadiness on feet (R26.81)     Time: 1610-9604 PT Time Calculation (min) (ACUTE ONLY): 14 min  Charges:    $Therapeutic Activity: 8-22 mins PT General Charges $$ ACUTE PT VISIT: 1 Visit                     Quillian Brunt Fairly, PT, DPT 08/11/23 2:27 PM

## 2023-08-11 NOTE — Plan of Care (Signed)
   Problem: Education: Goal: Knowledge of General Education information will improve Description Including pain rating scale, medication(s)/side effects and non-pharmacologic comfort measures Outcome: Progressing

## 2023-08-11 NOTE — Plan of Care (Signed)

## 2023-08-12 DIAGNOSIS — W19XXXS Unspecified fall, sequela: Secondary | ICD-10-CM | POA: Diagnosis not present

## 2023-08-12 DIAGNOSIS — N179 Acute kidney failure, unspecified: Secondary | ICD-10-CM | POA: Diagnosis not present

## 2023-08-12 DIAGNOSIS — R531 Weakness: Secondary | ICD-10-CM | POA: Diagnosis not present

## 2023-08-12 DIAGNOSIS — R413 Other amnesia: Secondary | ICD-10-CM | POA: Insufficient documentation

## 2023-08-12 DIAGNOSIS — N3000 Acute cystitis without hematuria: Secondary | ICD-10-CM | POA: Diagnosis not present

## 2023-08-12 NOTE — Progress Notes (Signed)
 Occupational Therapy Treatment Patient Details Name: [name] MRN: [id number] DOB: 02-Jan-1951 Today's Date: 08/12/2023   History of present illness Patient is a 73 year old female with fall, generalized weakness, UA concerning for UTI. L hip pain with imaging negative. History of overactive bladder, hypertension, hyperlipidemia, recurrent falls, fibromyalgia   OT comments  Pt seen for OT treatment on this date. Upon arrival to room pt resting in supine with LLE hanging off the bed. Questionable orientation throughout session, when asked initially pt stated location and situation. As session progressed pt asked multiple times, "are we at [facility] house?" and "who are the people outside my room?" Increased processing time to respond to questions. Pt required encouragement to tolerate dressing change at bed level, pt refused further OOB mobility/ADLs due to stated hip pain, despite verbal encouragement. Pt requires MAXA for bed level rolling to the R and L side during bed level pericare and bed change. RN in room to assess pain and assist in bed change. Pt making good progress toward goals, will continue to follow POC. Discharge recommendation remains appropriate.        If plan is discharge home, recommend the following:  A lot of help with walking and/or transfers;A lot of help with bathing/dressing/bathroom;Direct supervision/assist for medications management;Supervision due to cognitive status;Direct supervision/assist for financial management;Assistance with cooking/housework;Assist for transportation;Help with stairs or ramp for entrance   Equipment Recommendations  Other (comment)    Recommendations for Other Services      Precautions / Restrictions Precautions Precautions: Fall Recall of Precautions/Restrictions: Intact       Mobility Bed Mobility Overal bed mobility: Needs Assistance Bed Mobility: Rolling Rolling: Max assist, Used rails         General bed mobility  comments: RN in room to assist full bed change and pericare at bed level    Transfers Overall transfer level: Needs assistance                 General transfer comment: Deferred further mobility/ADLs to next session due to pt agitation and pain.                                                ADL either performed or assessed with clinical judgement   ADL Overall ADL's : Needs assistance/impaired Eating/Feeding: Modified independent;Bed level               Upper Body Dressing : Moderate assistance;Bed level           Toileting- Clothing Manipulation and Hygiene: Maximal assistance;Bed level;+2 for physical assistance         General ADL Comments: Deferred further mobility/ADLs to next session due to pt agitation and pain.    Communication Communication Communication: Impaired Factors Affecting Communication: Hearing impaired   Cognition Arousal: Alert Behavior During Therapy: WFL for tasks assessed/performed Cognition: No family/caregiver present to determine baseline             OT - Cognition Comments: Questionable orientation, when asked initially pt stated location and situation. As session progressed pt asked multiple times, "are we at [facility] house?" and "who are the people outside my room?" Increased processing time to respond to questions.                 Following commands: Impaired Following commands impaired: Follows one step commands with increased time,  Follows multi-step commands inconsistently      Cueing   Cueing Techniques: Verbal cues  Exercises Exercises: Other exercises Other Exercises Other Exercises: Edu: benefits of letting nursing know when she feels the need use the BR, rolling/handplacement technique.    Shoulder Instructions       General Comments Pt very confused on this date, unwilling to participate further due to reported pain. Dispite verbal encouragement    Pertinent Vitals/ Pain        Pain Assessment Pain Assessment: 0-10 Pain Score: 7  Faces Pain Scale: Hurts even more Pain Location: L hip with movement Pain Descriptors / Indicators: Discomfort, Grimacing Pain Intervention(s): Limited activity within patient's tolerance, Repositioned, Monitored during session  Home Living                                          Prior Functioning/Environment              Frequency  Min 2X/week        Progress Toward Goals  OT Goals(current goals can now be found in the care plan section)  Progress towards OT goals: Progressing toward goals  Acute Rehab OT Goals Patient Stated Goal: have less pain OT Goal Formulation: With patient Time For Goal Achievement: 08/24/23 Potential to Achieve Goals: Good  Plan      Co-evaluation                 AM-PAC OT "6 Clicks" Daily Activity     Outcome Measure   Help from another person eating meals?: None Help from another person taking care of personal grooming?: None Help from another person toileting, which includes using toliet, bedpan, or urinal?: A Lot Help from another person bathing (including washing, rinsing, drying)?: A Lot Help from another person to put on and taking off regular upper body clothing?: A Little Help from another person to put on and taking off regular lower body clothing?: A Lot 6 Click Score: 17    End of Session    OT Visit Diagnosis: Other abnormalities of gait and mobility (R26.89);History of falling (Z91.81);Muscle weakness (generalized) (M62.81);Pain Pain - Right/Left: Left Pain - part of body: Hip   Activity Tolerance Treatment limited secondary to agitation;Patient limited by pain   Patient Left in bed;with call bell/phone within reach;with bed alarm set   Nurse Communication Mobility status        Time: 1433-1450 OT Time Calculation (min): 17 min  Charges: OT General Charges $OT Visit: 1 Visit OT Treatments $Self Care/Home Management : 8-22  mins  Rosaria Common M.S. OTR/L  08/12/23, 3:34 PM

## 2023-08-12 NOTE — TOC Progression Note (Signed)
 Transition of Care [facility]) - Progression Note    Patient Details  Name: [name] MRN: [id number] Date of Birth: 13-Jan-1951  Transition of Care [facility]) CM/SW Contact  Crayton Docker, RN 08/12/2023, 11:34 AM  Clinical Narrative:      CM completed FL2, alert to Dr. Clelia Current for co-signature. CM faxed SNF referral to SNFs, [facility], [place], [address] and [address], [address], [place], [facility] and [facility]. CM will continue to follow for discharge care planning needs.       Expected Discharge Plan and Services                                               Social Determinants of Health (SDOH) Interventions SDOH Screenings   Food Insecurity: No Food Insecurity (08/10/2023)  Housing: Low Risk  (08/10/2023)  Transportation Needs: No Transportation Needs (08/10/2023)  Utilities: Not At Risk (08/10/2023)  Social Connections: Unknown (08/10/2023)  Tobacco Use: Medium Risk (08/09/2023)    Readmission Risk Interventions     No data to display

## 2023-08-12 NOTE — Progress Notes (Signed)
 Progress Note   Patient: [name] ZOX:[id number] DOB: May 14, 1950 DOA: 08/09/2023     2 DOS: the patient was seen and examined on 08/12/2023   Brief hospital course: 73 y.o. female with medical history significant of Overactive bladder, hypertension, hyperlipidemia, recurrent falls, fibromyalgia, who presents to the ED due to ground-level fall.   [name] states that she does not quite remember what occurred but believes she fell sometime last night.  She is not sure how she fell or how she landed but suspects she may have lost her footing.  Then today, she has been having weakness all over with no focal weakness noted.  She endorses left hip pain.  She denies any dizziness, headache, nausea, vomiting, abdominal pain, urinary symptoms.   ED course: On arrival to the ED, patient was normotensive at 128/62 with heart rate of 84.  She was saturating at 95% on room air.  She was afebrile at 98.5.  Initial workup notable for unremarkable CBC, creatinine 1.16 with GFR 49.  Urinalysis with large leukocytes, rare bacteria and elevated WBC/hpf.  Chest x-ray with no active disease, left femur x-ray with no acute findings, and hip x-ray with no acute findings.  CT of the head with no acute intracranial abnormalities.  CT of the pelvis with postop left hip arthroplasty.  Patient started on IV fluids, Rocephin and fentanyl.  TRH contacted for admission.  4/13.  Physical therapy recommending rehab took 2 people to get her up.  Patient had a fall and feels weak.  Started on Rocephin.  Left hip and pelvis x-ray negative does show a left hip arthroplasty, left femur x-ray negative does show left knee arthroplasty.  CT scan of the pelvis showed postop left hip arthroplasty cannot exclude nondisplaced fracture of the proximal left femur due to artifact from the metallic hardware correlate with left hip radiograph.  Patient is an unsafe discharge home.  Patient is elderly and has osteopenia, had a fall and cannot  rule out a fracture. 4/14.  Today patient able to straight leg raise without using her arms.  Creatinine down to 0.96.  [name] growing out of urine culture sensitive to Rocephin.  Assessment and Plan: * Fall Patient had a fall and is elderly.  She is an unsafe discharge at this point in time.  Patient needed her arms to lift up her left leg with straight leg raise on 4/13.  She required 2+ assist to stand up on 4/13.  She has osteopenia on the CT scan.  CT scan could not rule out a fracture.  Case discussed with orthopedic surgery and she may have a microfracture there with her osteopenia.  Recommended following up with a joint specialist.  Continue working with PT.  Patient needs rehab.  Today patient able to straight leg raise.  Acute cystitis without hematuria likely playing a role in the patient's fall and weakness.  Generalized weakness Patient is presenting with a unwitnessed ground-level fall and persistent generalized weakness.  Physical therapy recommending rehab since it took 2 people to get her up on 4/13.  Continue treatment of UTI with Rocephin.  Patient hydrated overnight.  Hold statin just in case contributing to weakness.  TSH normal range.  Acute cystitis without hematuria [name] growing out of urine cultures.  Continue Rocephin.  I believe this is likely one of the major factors in her generalized weakness and fall.  AKI (acute kidney injury) (HCC) Creatinine 1.16 on presentation down to 0.96 with fluids.  Baseline creatinine 0.71.  Memory loss Patient is at independent living at [facility] memory care.  Will need to walk in order to go back to that level care.  Anxiety and depression Continue home bupropion, sertraline, Klonopin.    Chronic pain Continue home tramadol  HTN (hypertension) Hold blood pressure medications again today.  Last blood pressure normal range.  Hyperlipidemia, unspecified Will give a statin holiday with generalized weakness.         Subjective: Patient feels okay.  Offers complaints of feeling weak.  Patient able to straight leg raise bilaterally.  Not having any pain.  Admitted with generalized weakness and required 2 people to get her up the other day.  Physical Exam: Vitals:   08/11/23 1545 08/11/23 2000 08/12/23 0446 08/12/23 0807  BP: 128/81 128/71 123/67 128/70  Pulse: 84 87 80 80  Resp: 18 18 18 16   Temp: 97.6 F (36.4 C) 97.9 F (36.6 C) 97.6 F (36.4 C) 97.7 F (36.5 C)  TempSrc:      SpO2: 97% 95% 97% 95%  Weight:      Height:       Physical Exam HENT:     Head: Normocephalic.     Mouth/Throat:     Pharynx: No oropharyngeal exudate.  Eyes:     General: Lids are normal.     Conjunctiva/sclera: Conjunctivae normal.  Cardiovascular:     Rate and Rhythm: Normal rate and regular rhythm.     Heart sounds: Normal heart sounds, S1 normal and S2 normal.  Pulmonary:     Breath sounds: No decreased breath sounds, wheezing, rhonchi or rales.  Abdominal:     Palpations: Abdomen is soft.     Tenderness: There is no abdominal tenderness.  Musculoskeletal:     Right lower leg: No swelling.     Left lower leg: No swelling.  Skin:    General: Skin is warm.     Findings: No rash.  Neurological:     Mental Status: She is alert.     Comments: Patient able to straight leg raise.     Data Reviewed: No new data  Family Communication: Spoke with son at the bedside  Disposition: Status is: Inpatient Remains inpatient appropriate because: Needs a 3 night stay as inpatient in order to go out to rehab.  Planned Discharge Destination: Rehab    Time spent: 28 minutes  Author: Verla Glaze, MD 08/12/2023 12:07 PM  For on call review [email or web address].

## 2023-08-12 NOTE — NC FL2 (Signed)
 [place]  MEDICAID FL2 LEVEL OF CARE FORM     IDENTIFICATION  Patient Name: [name] Birthdate: 02-Nov-1950 Sex: female Admission Date (Current Location): 08/09/2023  [facility] and [place] Number:  Chiropodist and Address:  [facility], [address] [place], [place] [address]      Provider Number: [id number]  Attending Physician Name and Address:  Verla Glaze, MD  Relative Name and Phone Number:  Eulalio Hibbs, son, phone: [phone]    Current Level of Care: Hospital Recommended Level of Care: Skilled Nursing Facility Prior Approval Number:    Date Approved/Denied:   PASRR Number:    Discharge Plan:      Current Diagnoses: Patient Active Problem List   Diagnosis Date Noted   Acute cystitis without hematuria 08/10/2023   Fall 08/10/2023   Generalized weakness 08/09/2023   Chronic pain 08/09/2023   AKI (acute kidney injury) (HCC) 08/09/2023   Anxiety and depression 08/09/2023   Hyperlipidemia, unspecified 12/12/2022   HTN (hypertension) 12/12/2022   Benign essential tremor syndrome 04/11/2016   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   Expected blood loss anemia 02/21/2012   Obese 02/21/2012   Hyponatremia 02/21/2012   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/2012    Orientation RESPIRATION BLADDER Height & Weight        Normal Incontinent Weight: 80.2 kg Height:  5\' 2"  (157.5 cm)  BEHAVIORAL SYMPTOMS/MOOD NEUROLOGICAL BOWEL NUTRITION STATUS      Incontinent Diet (Please see discharge summary)  AMBULATORY STATUS COMMUNICATION OF NEEDS Skin     Verbally Skin abrasions (dry, ecchymosis, non-tenting)                       Personal Care Assistance Level of Assistance              Functional Limitations Info             SPECIAL CARE FACTORS FREQUENCY  PT (By licensed PT), OT (By licensed OT)     PT Frequency: 5x per week OT Frequency: 5x per week            Contractures      Additional Factors Info  Code  Status Code Status Info: Full Code             Current Medications (08/12/2023):  This is the current hospital active medication list Current Facility-Administered Medications  Medication Dose Route Frequency Provider Last Rate Last Admin   acetaminophen (TYLENOL) tablet 650 mg  650 mg Oral Q6H PRN Avi Body, MD       Or   acetaminophen (TYLENOL) suppository 650 mg  650 mg Rectal Q6H PRN Basaraba, Iulia, MD       buPROPion (WELLBUTRIN) tablet 100 mg  100 mg Oral BID Basaraba, Iulia, MD   100 mg at 08/12/23 1884   cefTRIAXone (ROCEPHIN) 1 g in sodium chloride 0.9 % 100 mL IVPB  1 g Intravenous Q24H Avi Body, MD   Stopped at 08/11/23 1858   clonazePAM (KLONOPIN) tablet 0.5 mg  0.5 mg Oral QHS Basaraba, Iulia, MD   0.5 mg at 08/11/23 2030   docusate sodium (COLACE) capsule 100 mg  100 mg Oral Daily Basaraba, Iulia, MD   100 mg at 08/12/23 0851   enoxaparin (LOVENOX) injection 40 mg  40 mg Subcutaneous Q24H Basaraba, Iulia, MD   40 mg at 08/11/23 1828   fesoterodine (TOVIAZ) tablet 4 mg  4 mg Oral Daily Verla Glaze, MD   4 mg at  08/12/23 0852   ondansetron (ZOFRAN) tablet 4 mg  4 mg Oral Q6H PRN Avi Body, MD       Or   ondansetron (ZOFRAN) injection 4 mg  4 mg Intravenous Q6H PRN Basaraba, Iulia, MD       polyethylene glycol (MIRALAX / GLYCOLAX) packet 17 g  17 g Oral Daily PRN Basaraba, Iulia, MD       sertraline (ZOLOFT) tablet 150 mg  150 mg Oral Daily Basaraba, Iulia, MD   150 mg at 08/12/23 0852   sodium chloride flush (NS) 0.9 % injection 3 mL  3 mL Intravenous Q12H Avi Body, MD   3 mL at 08/12/23 0852   traMADol (ULTRAM) tablet 50 mg  50 mg Oral Q8H PRN Verla Glaze, MD   50 mg at 08/11/23 2029     Discharge Medications: Please see discharge summary for a list of discharge medications.  Relevant Imaging Results:  Relevant Lab Results:   Additional Information Allergies:Erythromycin  No severity specified - Other (See Comments) Comments   Methocarbamol  No severity specified - Nausea Only  Prednisone  No severity or reactions specified  Sulfa Antibiotics  No severity specified - Other (See Comments)  Crayton Docker, RN

## 2023-08-12 NOTE — Assessment & Plan Note (Signed)
 Patient is at independent living at [facility] memory care.  Will need to walk in order to go back to that level care.

## 2023-08-13 DIAGNOSIS — R531 Weakness: Secondary | ICD-10-CM | POA: Diagnosis not present

## 2023-08-13 LAB — BASIC METABOLIC PANEL WITH GFR
Anion gap: 6 (ref 5–15)
BUN: 14 mg/dL (ref 8–23)
CO2: 25 mmol/L (ref 22–32)
Calcium: 8.6 mg/dL — ABNORMAL LOW (ref 8.9–10.3)
Chloride: 109 mmol/L (ref 98–111)
Creatinine, Ser: 0.79 mg/dL (ref 0.44–1.00)
GFR, Estimated: >60 mL/min (ref >60)
Glucose, Bld: 87 mg/dL (ref 70–99)
Potassium: 3.6 mmol/L (ref 3.5–5.1)
Sodium: 140 mmol/L (ref 135–145)

## 2023-08-13 LAB — CBC
HCT: 33 % — ABNORMAL LOW (ref 36.0–46.0)
Hemoglobin: 11 g/dL — ABNORMAL LOW (ref 12.0–15.0)
MCH: 30.1 pg (ref 26.0–34.0)
MCHC: 33.3 g/dL (ref 30.0–36.0)
MCV: 90.4 fL (ref 80.0–100.0)
Platelets: 182 10*3/uL (ref 150–400)
RBC: 3.65 MIL/uL — ABNORMAL LOW (ref 3.87–5.11)
RDW: 11.9 % (ref 11.5–15.5)
WBC: 6.1 10*3/uL (ref 4.0–10.5)
nRBC: 0 % (ref 0.0–0.2)

## 2023-08-13 MED ORDER — SODIUM CHLORIDE 0.9 % IV SOLN
1.0000 g | INTRAVENOUS | Status: AC
  Administered 2023-08-13: 1 g via INTRAVENOUS
  Filled 2023-08-13: qty 10

## 2023-08-13 NOTE — TOC Progression Note (Signed)
 Transition of Care [facility]) - Progression Note    Patient Details  Name: CHERMAINE SCHNYDER MRN: [id number] Date of Birth: April 20, 1951  Transition [facility]) CM/SW Contact  Crayton Docker, RN Phone Number: 08/13/2023, 12:45 PM  Clinical Narrative:     Noted, SNF accepted offers. CM call to patient's son, Berta Brittle, phone: [phone] regarding SNF bed offers. Per patient's son, prefers for patient to attend short term rehabilitation in or near [place]. Per patient's son, prefers [facility] or [facility]  CM call to Antony Baumgartner, Admissions, [facility],phone: [phone] regarding bed offer. Per Antony Baumgartner, SNF is extending bed offer. CM requested PASSR in [place] MUST. Screen under review. CM will follow up.   Expected Discharge Plan:  ([facility], [facility]) Barriers to Discharge: Continued Medical Work up  [facility] Services   Discharge Planning Services: CM Consult   Living arrangements for the past 2 months: Apartment                  Social Determinants of Health (SDOH) Interventions SDOH Screenings   Food Insecurity: No Food Insecurity (08/10/2023)  Housing: Low Risk  (08/10/2023)  Transportation Needs: No Transportation Needs (08/10/2023)  Utilities: Not At Risk (08/10/2023)  Social Connections: Unknown (08/10/2023)  Tobacco Use: Medium Risk (08/09/2023)    Readmission Risk Interventions     No data to display

## 2023-08-13 NOTE — Progress Notes (Signed)
 Occupational Therapy Treatment Patient Details Name: [name] MRN: [id number] DOB: 1950/05/05 Today's Date: 08/13/2023   History of present illness Patient is a 73 year old female with fall, generalized weakness, UA concerning for UTI. L hip pain with imaging negative. History of overactive bladder, hypertension, hyperlipidemia, recurrent falls, fibromyalgia   OT comments  Pt seen for OT treatment on this date. Upon arrival to room pt seated in recliner, agreeable to tx. Noted pt only ate a few bites of her lunch stating she wasn't feeling like eating. Pt requested to return to bed. Pt requires MAXA for simulated toilet transfer from recliner to EOB. STS MAXA x2 attempts; on initial STS pt unable to clear chair, 2nd attempt with MAXA + heavy cues pt able to come to full stand, MAXA to initiate side steps towards the EOB. Pt retired in bed with all needs in reach stating she was 0/10 pain. Pt making good progress toward goals, will continue to follow POC. Discharge recommendation remains appropriate.        If plan is discharge home, recommend the following:  A lot of help with walking and/or transfers;A lot of help with bathing/dressing/bathroom;Direct supervision/assist for medications management;Supervision due to cognitive status;Direct supervision/assist for financial management;Assistance with cooking/housework;Assist for transportation;Help with stairs or ramp for entrance   Equipment Recommendations  Other (comment)    Recommendations for Other Services      Precautions / Restrictions Precautions Precautions: Fall Recall of Precautions/Restrictions: Intact Restrictions Weight Bearing Restrictions Per Provider Order: No       Mobility Bed Mobility Overal bed mobility: Needs Assistance Bed Mobility: Sit to Supine       Sit to supine: Mod assist, Used rails   General bed mobility comments: LE support    Transfers Overall transfer level: Needs  assistance Equipment used: Rolling walker (2 wheels) Transfers: Sit to/from Stand Sit to Stand: Max assist           General transfer comment: STS x2 attempts with MAXA from recliner, amb to EOB with RW     Balance Overall balance assessment: Needs assistance Sitting-balance support: Feet supported, Bilateral upper extremity supported Sitting balance-Leahy Scale: Fair     Standing balance support: Bilateral upper extremity supported, During functional activity Standing balance-Leahy Scale: Poor                             ADL either performed or assessed with clinical judgement   ADL Overall ADL's : Needs assistance/impaired Eating/Feeding: Set up (Few bites taken out of her lunch)                       Toilet Transfer: BSC/3in1;Rolling walker (2 wheels);Maximal assistance (Simulated step pivot t/f to EOB from recliner)           Functional mobility during ADLs: Maximal assistance;Rolling walker (2 wheels) General ADL Comments: Pt kindly refused other ADL tasks on this date due to increased pain levels    Extremity/Trunk Assessment              Vision       Perception     Praxis     Communication Communication Communication: Impaired Factors Affecting Communication: Hearing impaired   Cognition Arousal: Alert Behavior During Therapy: WFL for tasks assessed/performed Cognition: No family/caregiver present to determine baseline             OT - Cognition Comments: Alert/oriented x3  Following commands: Impaired Following commands impaired: Follows one step commands with increased time, Follows multi-step commands inconsistently      Cueing   Cueing Techniques: Verbal cues  Exercises Exercises: Other exercises Other Exercises Other Exercises: Edu: Role of therapy, benefits of OOB/recliner transfers    Shoulder Instructions       General Comments Unable to tolerate prolong standing on this date,  heavy reliant on RW when standing    Pertinent Vitals/ Pain       Pain Assessment Pain Assessment: 0-10 Pain Score: 5  (Pre mobility 5, and 0 at rest post mobility while in bed) Faces Pain Scale: Hurts even more Pain Location: L hip with movement Pain Descriptors / Indicators: Discomfort, Grimacing Pain Intervention(s): Limited activity within patient's tolerance, Monitored during session, Repositioned  Home Living                                          Prior Functioning/Environment              Frequency  Min 2X/week        Progress Toward Goals  OT Goals(current goals can now be found in the care plan section)     Acute Rehab OT Goals Patient Stated Goal: have less pain OT Goal Formulation: With patient Time For Goal Achievement: 08/24/23 Potential to Achieve Goals: Good  Plan      Co-evaluation                 AM-PAC OT "6 Clicks" Daily Activity     Outcome Measure   Help from another person eating meals?: None Help from another person taking care of personal grooming?: None Help from another person toileting, which includes using toliet, bedpan, or urinal?: A Lot Help from another person bathing (including washing, rinsing, drying)?: A Lot Help from another person to put on and taking off regular upper body clothing?: A Little Help from another person to put on and taking off regular lower body clothing?: A Lot 6 Click Score: 17    End of Session Equipment Utilized During Treatment: Rolling walker (2 wheels)  OT Visit Diagnosis: Other abnormalities of gait and mobility (R26.89);History of falling (Z91.81);Muscle weakness (generalized) (M62.81);Pain Pain - Right/Left: Left Pain - part of body: Hip   Activity Tolerance Patient tolerated treatment well   Patient Left in bed;with call bell/phone within reach;with bed alarm set   Nurse Communication Mobility status        Time: 1610-9604 OT Time Calculation (min): 35  min  Charges: OT General Charges $OT Visit: 1 Visit OT Treatments $Self Care/Home Management : 23-37 mins  Rosaria Common M.S. OTR/L  08/13/23, 3:53 PM

## 2023-08-13 NOTE — Progress Notes (Signed)
 PROGRESS NOTE    [name]   MWN:[id number] DOB: 1950/08/15  DOA: 08/09/2023 Date of Service: 08/13/23 which is hospital day 3  PCP: [facility] course / significant events:   HPI: 73 y.o. female with medical history significant of Overactive bladder, hypertension, hyperlipidemia, recurrent falls, fibromyalgia, who presents to the ED due to ground-level fall.  04/12: admitted to hospitalist, fall, AKI, UTI 04/13: PT recs rehab. CT pelvis showed postop left hip arthroplasty cannot exclude nondisplaced fracture of the proximal left femur due to artifact from the metallic hardware correlate with left hip radiograph.  Patient is an unsafe discharge home.  Patient is elderly and has osteopenia, had a fall and cannot rule out a fracture. 04/14.  Today patient able to straight leg raise without using her arms.  Creatinine down to 0.96.  E. coli growing out of urine culture sensitive to Rocephin. 04/15-04/16: pending SNF placement for rehab     Consultants:  none  Procedures/Surgeries: none      ASSESSMENT & PLAN:   Fall Generalized weakness Debility Patient had a fall and is elderly.  She is an unsafe discharge at this point in time.  Patient needed her arms to lift up her left leg with straight leg raise on 4/13.  She required 2+ assist to stand up on 4/13.  She has osteopenia on the CT scan.  CT scan could not rule out a fracture.  Case discussed with orthopedic surgery and she may have a microfracture there with her osteopenia.  Recommended following up with a joint specialist.  Continue working with PT.  Patient needs rehab - pending placement. Acute cystitis without hematuria likely playing a role in the patient's fall and weakness.   Acute cystitis without hematuria likely one of the major factors in her generalized weakness and fall. E. coli growing out of urine cultures.  Continue Rocephin x5 days total   AKI (acute kidney  injury) (HCC) Creatinine 1.16 on presentation down to 0.96 with fluids.  Baseline creatinine 0.71.   Memory loss Patient is at independent living at [facility] memory care.  Will need to walk in order to go back to that level care.   Anxiety and depression Continue home bupropion, sertraline, Klonopin.     Chronic pain Continue home tramadol   HTN (hypertension) Hold blood pressure medications.  Last blood pressure normal range.   Hyperlipidemia, unspecified statin holiday with generalized weaknes    Class 1 obesity based on BMI: Body mass index is 32.34 kg/m.  Underweight - under 18  overweight - 25 to 29 obese - 30 or more Class 1 obesity: BMI of 30.0 to 34 Class 2 obesity: BMI of 35.0 to 39 Class 3 obesity: BMI of 40.0 to 49 Super Morbid Obesity: BMI 50-59 Super-super Morbid Obesity: BMI 60+ Significantly low or high BMI is associated with higher medical risk.  Weight management advised as adjunct to other disease management and risk reduction treatments    DVT prophylaxis: lovenox  IV fluids: no continuous IV fluids  Nutrition: regular diet Central lines / other devices: none  Code Status: FULL CODE ACP documentation reviewed: none on file in [facility] needs: SNF rehab placement Medical barriers to dispo: none.               Subjective / Brief ROS:  Patient reports no concerns She is confused, appears baseline, not oriented to place / time Denies CP/SOB.  Pain controlled.  Denies new weakness.  Tolerating diet.  Reports no concerns w/ urination/defecation.   Family Communication: none at this time     Objective Findings:  Vitals:   08/12/23 1747 08/12/23 2052 08/13/23 0547 08/13/23 0852  BP: 122/70 119/63 134/75 137/77  Pulse: 81 81 76 75  Resp: 18 18 19 17   Temp: 98.2 F (36.8 C) 98.2 F (36.8 C) 98.8 F (37.1 C) 97.9 F (36.6 C)  TempSrc:      SpO2: 96% 96% 98% 97%  Weight:      Height:        Intake/Output Summary (Last  24 hours) at 08/13/2023 1448 Last data filed at 08/13/2023 0700 Gross per 24 hour  Intake 100 ml  Output --  Net 100 ml   Filed Weights   08/09/23 1202  Weight: 80.2 kg    Examination:  Physical Exam Constitutional:      General: She is not in acute distress. Cardiovascular:     Rate and Rhythm: Normal rate and regular rhythm.  Pulmonary:     Effort: Pulmonary effort is normal.     Breath sounds: Normal breath sounds.  Abdominal:     Palpations: Abdomen is soft.  Musculoskeletal:     Right lower leg: No edema.     Left lower leg: No edema.  Skin:    General: Skin is warm and dry.  Neurological:     Mental Status: She is alert. Mental status is at baseline.  Psychiatric:        Mood and Affect: Mood normal.        Behavior: Behavior normal.          Scheduled Medications:   buPROPion  100 mg Oral BID   clonazePAM  0.5 mg Oral QHS   docusate sodium  100 mg Oral Daily   enoxaparin (LOVENOX) injection  40 mg Subcutaneous Q24H   fesoterodine  4 mg Oral Daily   sertraline  150 mg Oral Daily   sodium chloride flush  3 mL Intravenous Q12H    Continuous Infusions:  cefTRIAXone (ROCEPHIN)  IV      PRN Medications:  acetaminophen **OR** acetaminophen, ondansetron **OR** ondansetron (ZOFRAN) IV, polyethylene glycol, traMADol  Antimicrobials from admission:  Anti-infectives (From admission, onward)    Start     Dose/Rate Route Frequency Ordered Stop   08/13/23 1800  cefTRIAXone (ROCEPHIN) 1 g in sodium chloride 0.9 % 100 mL IVPB        1 g 200 mL/hr over 30 Minutes Intravenous Every 24 hours 08/13/23 1310 08/14/23 1759   08/10/23 1900  cefTRIAXone (ROCEPHIN) 1 g in sodium chloride 0.9 % 100 mL IVPB  Status:  Discontinued        1 g 200 mL/hr over 30 Minutes Intravenous Every 24 hours 08/09/23 2003 08/13/23 1310   08/10/23 1500  cefTRIAXone (ROCEPHIN) 2 g in sodium chloride 0.9 % 100 mL IVPB  Status:  Discontinued        2 g 200 mL/hr over 30 Minutes Intravenous  Every 24 hours 08/09/23 2003 08/09/23 2003   08/09/23 1800  cefTRIAXone (ROCEPHIN) 2 g in sodium chloride 0.9 % 100 mL IVPB        2 g 200 mL/hr over 30 Minutes Intravenous  Once 08/09/23 1746 08/09/23 2003           Data Reviewed:  I have personally reviewed the following...  CBC: Recent Labs  Lab 08/09/23 1217 08/10/23 0316 08/11/23 0538 08/13/23 0301  WBC 6.2  6.2 5.2 6.1  HGB 12.6 11.5* 10.7* 11.0*  HCT 37.7 34.4* 31.1* 33.0*  MCV 90.4 90.5 90.1 90.4  PLT 190 194 161 182   Basic Metabolic Panel: Recent Labs  Lab 08/09/23 1217 08/10/23 0316 08/11/23 0538 08/13/23 0301  NA 136 135 139 140  K 4.8 3.9 3.3* 3.6  CL 104 103 109 109  CO2 24 20* 24 25  GLUCOSE 103* 81 92 87  BUN 23 18 18 14   CREATININE 1.16* 1.05* 0.96 0.79  CALCIUM 9.3 8.8* 8.8* 8.6*   GFR: Estimated Creatinine Clearance: 60.5 mL/min (by C-G formula based on SCr of 0.79 mg/dL). Liver Function Tests: No results for input(s): "AST", "ALT", "ALKPHOS", "BILITOT", "PROT", "ALBUMIN" in the last 168 hours. No results for input(s): "LIPASE", "AMYLASE" in the last 168 hours. No results for input(s): "AMMONIA" in the last 168 hours. Coagulation Profile: No results for input(s): "INR", "PROTIME" in the last 168 hours. Cardiac Enzymes: Recent Labs  Lab 08/10/23 0316  CKTOTAL 69   BNP (last 3 results) No results for input(s): "PROBNP" in the last 8760 hours. HbA1C: No results for input(s): "HGBA1C" in the last 72 hours. CBG: No results for input(s): "GLUCAP" in the last 168 hours. Lipid Profile: No results for input(s): "CHOL", "HDL", "LDLCALC", "TRIG", "CHOLHDL", "LDLDIRECT" in the last 72 hours. Thyroid Function Tests: Recent Labs    08/11/23 0538  TSH 2.949   Anemia Panel: No results for input(s): "VITAMINB12", "FOLATE", "FERRITIN", "TIBC", "IRON", "RETICCTPCT" in the last 72 hours. Most Recent Urinalysis On File:     Component Value Date/Time   COLORURINE YELLOW (A) 08/09/2023 1510    APPEARANCEUR HAZY (A) 08/09/2023 1510   APPEARANCEUR Clear 03/18/2023 1423   LABSPEC 1.009 08/09/2023 1510   PHURINE 6.0 08/09/2023 1510   GLUCOSEU NEGATIVE 08/09/2023 1510   HGBUR NEGATIVE 08/09/2023 1510   BILIRUBINUR NEGATIVE 08/09/2023 1510   BILIRUBINUR Negative 03/18/2023 1423   KETONESUR NEGATIVE 08/09/2023 1510   PROTEINUR NEGATIVE 08/09/2023 1510   UROBILINOGEN 0.2 10/14/2019 1011   UROBILINOGEN 0.2 07/24/2013 1451   NITRITE NEGATIVE 08/09/2023 1510   LEUKOCYTESUR LARGE (A) 08/09/2023 1510   Sepsis Labs: @LABRCNTIP (procalcitonin:4,lacticidven:4) Microbiology: Recent Results (from the past 240 hours)  Urine Culture     Status: Abnormal   Collection Time: 08/09/23  3:10 PM   Specimen: Urine, Random  Result Value Ref Range Status   Specimen Description   Final    URINE, RANDOM Performed at [facility], [address] [place], [place] [address]    Special Requests   Final    URINE, CLEAN CATCH Performed at [facility], [address], [place], [place] [address]    Culture >=100,000 COLONIES/mL ESCHERICHIA COLI (A)  Final   Report Status 08/11/2023 FINAL  Final   Organism ID, Bacteria ESCHERICHIA COLI (A)  Final      Susceptibility   Escherichia coli - MIC*    AMPICILLIN >=32 RESISTANT Resistant     CEFAZOLIN 16 SENSITIVE Sensitive     CEFEPIME <=0.12 SENSITIVE Sensitive     CEFTRIAXONE <=0.25 SENSITIVE Sensitive     CIPROFLOXACIN <=0.25 SENSITIVE Sensitive     GENTAMICIN >=16 RESISTANT Resistant     IMIPENEM <=0.25 SENSITIVE Sensitive     NITROFURANTOIN <=16 SENSITIVE Sensitive     TRIMETH/SULFA >=320 RESISTANT Resistant     AMPICILLIN/SULBACTAM >=32 RESISTANT Resistant     PIP/TAZO 64 INTERMEDIATE Intermediate ug/mL    * >=100,000 COLONIES/mL ESCHERICHIA COLI      Radiology  Studies last 3 days: No results found.        Ernie Sagrero, DO [facility] 08/13/2023, 2:48 PM    Dictation software may have been used to  generate the above note. Typos may occur and escape review in typed/dictated notes. Please contact Dr Authur Leghorn directly for clarity if needed.  Staff may message me via secure chat in Epic  but this may not receive an immediate response,  please page me for urgent matters!  If 7PM-7AM, please contact night coverage [email or web address]

## 2023-08-13 NOTE — Care Management Important Message (Signed)
 Important Message  Patient Details  Name: MARSELA KUAN MRN: [id number] Date of Birth: [birth year]   Important Message Given:  Yes - Medicare IM     Nazaiah Navarrete W, CMA 08/13/2023, 12:28 PM

## 2023-08-13 NOTE — Progress Notes (Signed)
 Physical Therapy Treatment Patient Details Name: [name] MRN: [id number] DOB: 04-18-1951 Today's Date: 08/13/2023   History of Present Illness Patient is a 73 year old female with fall, generalized weakness, UA concerning for UTI. L hip pain with imaging negative. History of overactive bladder, hypertension, hyperlipidemia, recurrent falls, fibromyalgia    PT Comments  Patient received supine in bed, agreeable to PT tx session. Patient lunch tray delivered, but not set up for the patient. Patient require Min to Mod A for bed mobility, assist required with LE's and trunk. Patient able to demo improvements with STS, with ability to stand Min A from EOB with RW. Increased time required, and continue to note posterior lean/retropulsion. Stand step transfer with RW, MIN A from bed > chair. Continued NMR focused on standing balance, anterior weight shift, and reduced UE support with patient tolerating well. Patient left in recliner with meal tray setup, chair alarm set, and all needs in reach. Discharge recommendation remains appropriate. Will continue to follow acutely.    If plan is discharge home, recommend the following: A lot of help with bathing/dressing/bathroom;Two people to help with walking and/or transfers;Assist for transportation;Assistance with cooking/housework   Can travel by private vehicle     No  Equipment Recommendations  None recommended by PT    Recommendations for Other Services       Precautions / Restrictions Precautions Precautions: Fall Restrictions Weight Bearing Restrictions Per Provider Order: No     Mobility  Bed Mobility Overal bed mobility: Needs Assistance Bed Mobility: Rolling, Sidelying to Sit Rolling: Min assist Sidelying to sit: Mod assist       General bed mobility comments: supine > rolling to L side, min A with use of rails. Patient require Mod A for sidelying > sit, with assist for BLE's and trunk support. Once seated EOB, mild  posterior lean with improvement with UE/LE support and improved positioning.    Transfers Overall transfer level: Needs assistance Equipment used: Rolling walker (2 wheels) Transfers: Sit to/from Stand, Bed to chair/wheelchair/BSC Sit to Stand: Min assist   Step pivot transfers: Min assist       General transfer comment: Pt able to stand from EOB with MIN A, cues for hand placement required and significant amount of time required to obtain upright position. Continues to have posterior preference/lean requiring Min A throughout to maintain balance. With use of RW, able to take lateral steps for stand step transfer from bed > recliner, Min A. Cues to push through RW with UE and anterior weight shift onto toes, minimal carryover throughout session    Ambulation/Gait               General Gait Details: Deferred   Stairs             Wheelchair Mobility     Tilt Bed    Modified Rankin (Stroke Patients Only)       Balance Overall balance assessment: Needs assistance Sitting-balance support: Feet supported, Bilateral upper extremity supported Sitting balance-Leahy Scale: Fair   Postural control: Posterior lean Standing balance support: Bilateral upper extremity supported, During functional activity Standing balance-Leahy Scale: Poor Standing balance comment: continue use of RW, posterior lean still noted with standing activity/transfer attempts, minimal improvement with faciliation/cues.                            Communication Communication Communication: Impaired Factors Affecting Communication: Hearing impaired  Cognition Arousal: Alert Behavior During  Therapy: WFL for tasks assessed/performed   PT - Cognitive impairments: No family/caregiver present to determine baseline                       PT - Cognition Comments: Cooperative throughout session. Disoriented to time, but able to determine with cues. Slow initiation throughout session  with all tasks. Following commands: Impaired Following commands impaired: Follows one step commands with increased time, Follows multi-step commands inconsistently    Cueing Cueing Techniques: Verbal cues  Exercises Other Exercises Other Exercises: Standing balance, working on anterior weight shift at RW with minimal improvements. PT provided Min A and intermittent letting go for corrective balance reactions with patient responsive. Trialed working toward improved balance with alternating UE raises and maintaining standing balance with reduced support/reliance on AD. Min A throughout.    General Comments        Pertinent Vitals/Pain Pain Assessment Pain Assessment: No/denies pain    Home Living                          Prior Function            PT Goals (current goals can now be found in the care plan section) Acute Rehab PT Goals PT Goal Formulation: With patient Time For Goal Achievement: 08/24/23 Potential to Achieve Goals: Good Progress towards PT goals: Progressing toward goals    Frequency    Min 2X/week      PT Plan      Co-evaluation              AM-PAC PT "6 Clicks" Mobility   Outcome Measure  Help needed turning from your back to your side while in a flat bed without using bedrails?: A Lot Help needed moving from lying on your back to sitting on the side of a flat bed without using bedrails?: A Lot Help needed moving to and from a bed to a chair (including a wheelchair)?: A Lot Help needed standing up from a chair using your arms (e.g., wheelchair or bedside chair)?: A Lot Help needed to walk in hospital room?: Total Help needed climbing 3-5 steps with a railing? : Total 6 Click Score: 10    End of Session Equipment Utilized During Treatment: Gait belt Activity Tolerance: Patient limited by fatigue Patient left: with call bell/phone within reach;in chair;with chair alarm set Nurse Communication: Mobility status PT Visit Diagnosis:  Muscle weakness (generalized) (M62.81);Unsteadiness on feet (R26.81)     Time: 9629-5284 PT Time Calculation (min) (ACUTE ONLY): 17 min  Charges:    $Therapeutic Activity: 8-22 mins PT General Charges $$ ACUTE PT VISIT: 1 Visit                     Quillian Brunt Fairly, PT, DPT 08/13/23 2:16 PM

## 2023-08-13 NOTE — Care Management Important Message (Signed)
 Important Message  Patient Details  Name: [name] MRN: [id number] Date of Birth: 1950-08-19   Important Message Given:        Brookie Cantor, CMA 08/13/2023, 11:49 AM

## 2023-08-13 NOTE — Plan of Care (Signed)

## 2023-08-14 DIAGNOSIS — R531 Weakness: Secondary | ICD-10-CM | POA: Diagnosis not present

## 2023-08-14 NOTE — TOC Progression Note (Addendum)
 Transition of Care [facility]) - Progression Note    Patient Details  Name: [name] MRN: [id number] Date of Birth: Sep 07, 1950  Transition of Care [facility]) CM/SW Contact  Crayton Docker, RN Phone Number: 08/14/2023, 2:27 PM  Clinical Narrative:     CM follow up in [place] MUST, PASSR is [id number]. CM follow up call placed to Antony Baumgartner, Admissions, [employer], phone: [phone], regarding PASSR approval and coordination of transfer for 08/15/2023, no answer, CM left message for return call.   Call received from [facility]. SNF will accept patient on tomorrow, 08/15/2023 at 1100. CM call placed to [employer], phone: [phone] regarding BLS transport. CM spoke to [place]. BLS transport scheduled for 1100 on 08/15/2023. CM call to patient's son, Berta Brittle regarding pending discharge for 08/15/2023 at 1100. Patient son verbalized understanding and agreement.   Expected Discharge Plan:  ([facility], [facility] [employer]) Barriers to Discharge: Continued Medical Work up  [facility]   Discharge Planning Services: CM Consult   Living arrangements for the past 2 months: Apartment      Social Determinants of Health (SDOH) Interventions SDOH Screenings   Food Insecurity: No Food Insecurity (08/10/2023)  Housing: Low Risk  (08/10/2023)  Transportation Needs: No Transportation Needs (08/10/2023)  Utilities: Not At Risk (08/10/2023)  Social Connections: Unknown (08/10/2023)  Tobacco Use: Medium Risk (08/09/2023)    Readmission Risk Interventions     No data to display

## 2023-08-14 NOTE — Progress Notes (Signed)
 Occupational Therapy Treatment Patient Details Name: [name] MRN: [id number] DOB: [birth year] Today's Date: 08/14/2023   History of present illness Patient is a 73 year old female with fall, generalized weakness, UA concerning for UTI. L hip pain with imaging negative. History of overactive bladder, hypertension, hyperlipidemia, recurrent falls, fibromyalgia   OT comments  Pt. required assist to reposition towards the [facility] in preparation for self-grooming tasks. Pt. is independent with oral care, facial care, and hand hygiene, however, required complete set-up, cues for task initiation, and increased time to complete. Pt. continues to benefit from OT services for ADL training, A/E training, UE there. Ex. and Pt./caregiver education about cognitive compensatory strategies, ADLs, and IADL functioning. OT Discharge recommendations remain appropriate.      If plan is discharge home, recommend the following:  A lot of help with walking and/or transfers;A lot of help with bathing/dressing/bathroom;Direct supervision/assist for medications management;Supervision due to cognitive status;Direct supervision/assist for financial management;Assistance with cooking/housework;Assist for transportation;Help with stairs or ramp for entrance   Equipment Recommendations       Recommendations for Other Services      Precautions / Restrictions Precautions Precautions: Fall Recall of Precautions/Restrictions: Intact Restrictions Weight Bearing Restrictions Per Provider Order: No       Mobility Bed Mobility               General bed mobility comments: Assist required to reposition towards the [facility] in preparaton for self-grooming tasks.    Transfers                         Balance  Pt. Seen for bed level tasks this afternoon                                         ADL either performed or assessed with clinical judgement   ADL Overall ADL's : Needs  assistance/impaired Eating/Feeding: Set up   Grooming: Set up;Supervision/safety;Wash/dry face;Oral care;Wash/dry hands                                      Extremity/Trunk Assessment Upper Extremity Assessment Upper Extremity Assessment: Generalized weakness            Vision       Perception     Praxis     Communication Communication Factors Affecting Communication: Hearing impaired   Cognition Arousal: Alert Behavior During Therapy: WFL for tasks assessed/performed Cognition: No family/caregiver present to determine baseline                                 Following commands impaired: Follows one step commands with increased time, Follows multi-step commands inconsistently      Cueing   Cueing Techniques: Verbal cues  Exercises      Shoulder Instructions       General Comments      Pertinent Vitals/ Pain       Pain Assessment Pain Assessment: No/denies pain Pain Score: 0-No pain  Home Living  Prior Functioning/Environment              Frequency  Min 2X/week        Progress Toward Goals  OT Goals(current goals can now be found in the care plan section)  Progress towards OT goals: Progressing toward goals  Acute Rehab OT Goals Patient Stated Goal: To have less pain OT Goal Formulation: With patient Time For Goal Achievement: 08/24/23 Potential to Achieve Goals: Good  Plan      Co-evaluation                 AM-PAC OT "6 Clicks" Daily Activity     Outcome Measure   Help from another person eating meals?: None Help from another person taking care of personal grooming?: None Help from another person toileting, which includes using toliet, bedpan, or urinal?: A Lot Help from another person bathing (including washing, rinsing, drying)?: A Lot Help from another person to put on and taking off regular upper body clothing?: A Little Help from  another person to put on and taking off regular lower body clothing?: A Lot 6 Click Score: 17    End of Session Equipment Utilized During Treatment: Rolling walker (2 wheels)  OT Visit Diagnosis: Other abnormalities of gait and mobility (R26.89);History of falling (Z91.81);Muscle weakness (generalized) (M62.81);Pain Pain - Right/Left: Left Pain - part of body: Hip   Activity Tolerance Patient tolerated treatment well   Patient Left in bed;with call bell/phone within reach;with bed alarm set   Nurse Communication Mobility status        Time: 5784-6962 OT Time Calculation (min): 22 min  Charges: OT General Charges $OT Visit: 1 Visit OT Treatments $Self Care/Home Management : 8-22 mins  Duey Ghent, MS, OTR/L   Duey Ghent 08/14/2023, 4:55 PM

## 2023-08-14 NOTE — Progress Notes (Signed)
 Physical Therapy Treatment Patient Details Name: [name] MRN: [id number] DOB: [birth year] Today's Date: 08/14/2023   History of Present Illness Patient is a 73 year old female with fall, generalized weakness, UA concerning for UTI. L hip pain with imaging negative. History of overactive bladder, hypertension, hyperlipidemia, recurrent falls, fibromyalgia    PT Comments  Patient supine in bed upon arrival, agreeable to PT tx session. Patient requesting to wash up, PT assist with bed mobility and transfer to recliner. Patient continue to require Min to Mod A for bed mobility, as well as Min A to complete STS and stand step transfer with RW. Slow movement overall, increased time for all mobility attempts. Patient require assist for changing gown, and assist to wash face, arms seated. Patient left in recliner with chair alarm set, and all needs in reach. Discharge recommendation remains appropriate. Will continue to follow acutely.     If plan is discharge home, recommend the following: A lot of help with bathing/dressing/bathroom;Two people to help with walking and/or transfers;Assist for transportation;Assistance with cooking/housework   Can travel by private vehicle     No  Equipment Recommendations  Other (comment) (TBD at next level of care)    Recommendations for Other Services       Precautions / Restrictions Precautions Precautions: Fall Recall of Precautions/Restrictions: Intact Restrictions Weight Bearing Restrictions Per Provider Order: No     Mobility  Bed Mobility Overal bed mobility: Needs Assistance Bed Mobility: Rolling, Sidelying to Sit Rolling: Min assist Sidelying to sit: Mod assist       General bed mobility comments: assist at trunk for full roll onto L side, then Mod A for LE support and intermittent trunk support to upright seated position. Increased time required.    Transfers Overall transfer level: Needs assistance Equipment used: Rolling  walker (2 wheels) Transfers: Sit to/from Stand, Bed to chair/wheelchair/BSC Sit to Stand: Min assist   Step pivot transfers: Min assist       General transfer comment: STS from EOB to RW, increased time to come into upright position, MIN A for lifting. Cues for hand placement. Patient able to stand step transfer from bed > recliner with Min A.    Ambulation/Gait         Gait velocity: Decreased     General Gait Details: Pt able to take some anterior/posterior steps at RW, but would not attempt further gait due to fatigue despite max encouragement from therapist.   Stairs             Wheelchair Mobility     Tilt Bed    Modified Rankin (Stroke Patients Only)       Balance   Sitting-balance support: Feet supported, Bilateral upper extremity supported Sitting balance-Leahy Scale: Fair     Standing balance support: Bilateral upper extremity supported, During functional activity Standing balance-Leahy Scale: Poor Standing balance comment: mild posterior lean required                            Communication Communication Communication: Impaired Factors Affecting Communication: Hearing impaired  Cognition Arousal: Alert Behavior During Therapy: WFL for tasks assessed/performed   PT - Cognitive impairments: No family/caregiver present to determine baseline                         Following commands: Impaired      Cueing Cueing Techniques: Verbal cues  Exercises Other Exercises Other Exercises:  PT assist with donning clean gown, Pt set up with warm cloth for sponge bath per patient request. PT assist intermittent    General Comments        Pertinent Vitals/Pain Pain Assessment Pain Assessment: No/denies pain    Home Living                          Prior Function            PT Goals (current goals can now be found in the care plan section) Acute Rehab PT Goals PT Goal Formulation: With patient Time For Goal  Achievement: 08/24/23 Potential to Achieve Goals: Good Progress towards PT goals: Progressing toward goals    Frequency    Min 2X/week      PT Plan      Co-evaluation              AM-PAC PT "6 Clicks" Mobility   Outcome Measure  Help needed turning from your back to your side while in a flat bed without using bedrails?: A Lot Help needed moving from lying on your back to sitting on the side of a flat bed without using bedrails?: A Lot Help needed moving to and from a bed to a chair (including a wheelchair)?: A Lot Help needed standing up from a chair using your arms (e.g., wheelchair or bedside chair)?: A Lot Help needed to walk in hospital room?: Total Help needed climbing 3-5 steps with a railing? : Total 6 Click Score: 10    End of Session Equipment Utilized During Treatment: Gait belt Activity Tolerance: Patient limited by fatigue Patient left: with call bell/phone within reach;in chair;with chair alarm set Nurse Communication: Mobility status PT Visit Diagnosis: Muscle weakness (generalized) (M62.81);Unsteadiness on feet (R26.81)     Time: 1478-2956 PT Time Calculation (min) (ACUTE ONLY): 17 min  Charges:    $Therapeutic Activity: 8-22 mins PT General Charges $$ ACUTE PT VISIT: 1 Visit                     Quillian Brunt Fairly, PT, DPT 08/14/23 11:57 AM

## 2023-08-14 NOTE — Progress Notes (Signed)
 PROGRESS NOTE    [name]   ZOX:[id number] DOB: 1950-09-25  DOA: 08/09/2023 Date of Service: 08/14/23 which is hospital day 4  PCP: [facility] course / significant events:   HPI: 73 y.o. female with medical history significant of Overactive bladder, hypertension, hyperlipidemia, recurrent falls, fibromyalgia, who presents to the ED due to ground-level fall.  04/12: admitted to hospitalist, fall, AKI, UTI 04/13: PT recs rehab. CT pelvis showed postop left hip arthroplasty cannot exclude nondisplaced fracture of the proximal left femur due to artifact from the metallic hardware correlate with left hip radiograph.  Patient is an unsafe discharge home.  Patient is elderly and has osteopenia, had a fall and cannot rule out a fracture. 04/14.  Today patient able to straight leg raise without using her arms.  Creatinine down to 0.96.  E. coli growing out of urine culture sensitive to Rocephin. 04/15-04/17: pending SNF placement for rehab     Consultants:  none  Procedures/Surgeries: none      ASSESSMENT & PLAN:   Fall Generalized weakness Debility Patient had a fall and is elderly.  She is an unsafe discharge at this point in time.  Patient needed her arms to lift up her left leg with straight leg raise on 4/13.  She required 2+ assist to stand up on 4/13.  She has osteopenia on the CT scan.  CT scan could not rule out a fracture.  Case discussed with orthopedic surgery and she may have a microfracture there with her osteopenia.  Recommended following up with a joint specialist.  Continue working with PT.  Patient needs rehab - pending placement. Acute cystitis without hematuria likely playing a role in the patient's fall and weakness.   Acute cystitis without hematuria likely one of the major factors in her generalized weakness and fall. E. coli growing out of urine cultures.  Continue Rocephin x5 days total   AKI (acute kidney  injury) (HCC) Creatinine 1.16 on presentation down to 0.96 with fluids.  Baseline creatinine 0.71.   Memory loss Patient is at independent living at [facility] memory care.  Will need to walk in order to go back to that level care.   Anxiety and depression Continue home bupropion, sertraline, Klonopin.     Chronic pain Continue home tramadol   HTN (hypertension) Hold blood pressure medications.  Last blood pressure normal range.   Hyperlipidemia, unspecified statin holiday with generalized weaknes    Class 1 obesity based on BMI: Body mass index is 32.34 kg/m.  Underweight - under 18  overweight - 25 to 29 obese - 30 or more Class 1 obesity: BMI of 30.0 to 34 Class 2 obesity: BMI of 35.0 to 39 Class 3 obesity: BMI of 40.0 to 49 Super Morbid Obesity: BMI 50-59 Super-super Morbid Obesity: BMI 60+ Significantly low or high BMI is associated with higher medical risk.  Weight management advised as adjunct to other disease management and risk reduction treatments    DVT prophylaxis: lovenox  IV fluids: no continuous IV fluids  Nutrition: regular diet Central lines / other devices: none  Code Status: FULL CODE ACP documentation reviewed: none on file in [facility]  [facility] needs: SNF rehab placement Medical barriers to dispo: none.               Subjective / Brief ROS:  Patient reports no concerns Nausea yesterday has resolved  Denies CP/SOB.  Pain controlled.  Denies new weakness.  Tolerating  diet.  Reports no concerns w/ urination/defecation.   Family Communication: none at this time     Objective Findings:  Vitals:   08/13/23 1729 08/13/23 2003 08/14/23 0506 08/14/23 0826  BP: 128/65 (!) 117/55 126/63 120/67  Pulse: 77 78 75 70  Resp: 18 16 18 16   Temp: 97.7 F (36.5 C) 98.2 F (36.8 C) 97.6 F (36.4 C) (!) 97.5 F (36.4 C)  TempSrc:  Oral Oral Oral  SpO2: 96% 94% 98% 97%  Weight:      Height:        Intake/Output Summary (Last 24 hours) at  08/14/2023 1316 Last data filed at 08/14/2023 0900 Gross per 24 hour  Intake 340 ml  Output 150 ml  Net 190 ml   Filed Weights   08/09/23 1202  Weight: 80.2 kg    Examination:  Physical Exam Constitutional:      General: She is not in acute distress. Cardiovascular:     Rate and Rhythm: Normal rate and regular rhythm.  Pulmonary:     Effort: Pulmonary effort is normal.     Breath sounds: Normal breath sounds.  Abdominal:     Palpations: Abdomen is soft.  Musculoskeletal:     Right lower leg: No edema.     Left lower leg: No edema.  Skin:    General: Skin is warm and dry.  Neurological:     Mental Status: She is alert. Mental status is at baseline.  Psychiatric:        Mood and Affect: Mood normal.        Behavior: Behavior normal.          Scheduled Medications:   buPROPion  100 mg Oral BID   clonazePAM  0.5 mg Oral QHS   docusate sodium  100 mg Oral Daily   enoxaparin (LOVENOX) injection  40 mg Subcutaneous Q24H   fesoterodine  4 mg Oral Daily   sertraline  150 mg Oral Daily   sodium chloride flush  3 mL Intravenous Q12H    Continuous Infusions:    PRN Medications:  acetaminophen **OR** acetaminophen, ondansetron **OR** ondansetron (ZOFRAN) IV, polyethylene glycol, traMADol  Antimicrobials from admission:  Anti-infectives (From admission, onward)    Start     Dose/Rate Route Frequency Ordered Stop   08/13/23 1800  cefTRIAXone (ROCEPHIN) 1 g in sodium chloride 0.9 % 100 mL IVPB        1 g 200 mL/hr over 30 Minutes Intravenous Every 24 hours 08/13/23 1310 08/13/23 1827   08/10/23 1900  cefTRIAXone (ROCEPHIN) 1 g in sodium chloride 0.9 % 100 mL IVPB  Status:  Discontinued        1 g 200 mL/hr over 30 Minutes Intravenous Every 24 hours 08/09/23 2003 08/13/23 1310   08/10/23 1500  cefTRIAXone (ROCEPHIN) 2 g in sodium chloride 0.9 % 100 mL IVPB  Status:  Discontinued        2 g 200 mL/hr over 30 Minutes Intravenous Every 24 hours 08/09/23 2003 08/09/23  2003   08/09/23 1800  cefTRIAXone (ROCEPHIN) 2 g in sodium chloride 0.9 % 100 mL IVPB        2 g 200 mL/hr over 30 Minutes Intravenous  Once 08/09/23 1746 08/09/23 2003           Data Reviewed:  I have personally reviewed the following...  CBC: Recent Labs  Lab 08/09/23 1217 08/10/23 0316 08/11/23 0538 08/13/23 0301  WBC 6.2 6.2 5.2 6.1  HGB 12.6 11.5* 10.7* 11.0*  HCT 37.7 34.4* 31.1* 33.0*  MCV 90.4 90.5 90.1 90.4  PLT 190 194 161 182   Basic Metabolic Panel: Recent Labs  Lab 08/09/23 1217 08/10/23 0316 08/11/23 0538 08/13/23 0301  NA 136 135 139 140  K 4.8 3.9 3.3* 3.6  CL 104 103 109 109  CO2 24 20* 24 25  GLUCOSE 103* 81 92 87  BUN 23 18 18 14   CREATININE 1.16* 1.05* 0.96 0.79  CALCIUM 9.3 8.8* 8.8* 8.6*   GFR: Estimated Creatinine Clearance: 60.5 mL/min (by C-G formula based on SCr of 0.79 mg/dL). Liver Function Tests: No results for input(s): "AST", "ALT", "ALKPHOS", "BILITOT", "PROT", "ALBUMIN" in the last 168 hours. No results for input(s): "LIPASE", "AMYLASE" in the last 168 hours. No results for input(s): "AMMONIA" in the last 168 hours. Coagulation Profile: No results for input(s): "INR", "PROTIME" in the last 168 hours. Cardiac Enzymes: Recent Labs  Lab 08/10/23 0316  CKTOTAL 69   BNP (last 3 results) No results for input(s): "PROBNP" in the last 8760 hours. HbA1C: No results for input(s): "HGBA1C" in the last 72 hours. CBG: No results for input(s): "GLUCAP" in the last 168 hours. Lipid Profile: No results for input(s): "CHOL", "HDL", "LDLCALC", "TRIG", "CHOLHDL", "LDLDIRECT" in the last 72 hours. Thyroid Function Tests: No results for input(s): "TSH", "T4TOTAL", "FREET4", "T3FREE", "THYROIDAB" in the last 72 hours.  Anemia Panel: No results for input(s): "VITAMINB12", "FOLATE", "FERRITIN", "TIBC", "IRON", "RETICCTPCT" in the last 72 hours. Most Recent Urinalysis On File:     Component Value Date/Time   COLORURINE YELLOW (A)  08/09/2023 1510   APPEARANCEUR HAZY (A) 08/09/2023 1510   APPEARANCEUR Clear 03/18/2023 1423   LABSPEC 1.009 08/09/2023 1510   PHURINE 6.0 08/09/2023 1510   GLUCOSEU NEGATIVE 08/09/2023 1510   HGBUR NEGATIVE 08/09/2023 1510   BILIRUBINUR NEGATIVE 08/09/2023 1510   BILIRUBINUR Negative 03/18/2023 1423   KETONESUR NEGATIVE 08/09/2023 1510   PROTEINUR NEGATIVE 08/09/2023 1510   UROBILINOGEN 0.2 10/14/2019 1011   UROBILINOGEN 0.2 07/24/2013 1451   NITRITE NEGATIVE 08/09/2023 1510   LEUKOCYTESUR LARGE (A) 08/09/2023 1510   Sepsis Labs: @LABRCNTIP (procalcitonin:4,lacticidven:4) Microbiology: Recent Results (from the past 240 hours)  Urine Culture     Status: Abnormal   Collection Time: 08/09/23  3:10 PM   Specimen: Urine, Random  Result Value Ref Range Status   Specimen Description   Final    URINE, RANDOM Performed at [facility], [address] [place], [place] [address]    Special Requests   Final    URINE, CLEAN CATCH Performed at [facility], [address] [place], [place] [address]    Culture >=100,000 COLONIES/mL ESCHERICHIA COLI (A)  Final   Report Status 08/11/2023 FINAL  Final   Organism ID, Bacteria ESCHERICHIA COLI (A)  Final      Susceptibility   Escherichia coli - MIC*    AMPICILLIN >=32 RESISTANT Resistant     CEFAZOLIN 16 SENSITIVE Sensitive     CEFEPIME <=0.12 SENSITIVE Sensitive     CEFTRIAXONE <=0.25 SENSITIVE Sensitive     CIPROFLOXACIN <=0.25 SENSITIVE Sensitive     GENTAMICIN >=16 RESISTANT Resistant     IMIPENEM <=0.25 SENSITIVE Sensitive     NITROFURANTOIN <=16 SENSITIVE Sensitive     TRIMETH/SULFA >=320 RESISTANT Resistant     AMPICILLIN/SULBACTAM >=32 RESISTANT Resistant     PIP/TAZO 64 INTERMEDIATE Intermediate ug/mL    * >=100,000 COLONIES/mL ESCHERICHIA COLI      Radiology Studies last 3 days: No results found.  Clif Serio, DO [facility] 08/14/2023, 1:16 PM    Dictation software may have  been used to generate the above note. Typos may occur and escape review in typed/dictated notes. Please contact Dr Authur Leghorn directly for clarity if needed.  Staff may message me via secure chat in Epic  but this may not receive an immediate response,  please page me for urgent matters!  If 7PM-7AM, please contact night coverage [email or web address]

## 2023-08-14 NOTE — Plan of Care (Signed)

## 2023-08-15 DIAGNOSIS — R531 Weakness: Secondary | ICD-10-CM | POA: Diagnosis not present

## 2023-08-15 MED ORDER — CLONAZEPAM 0.5 MG PO TABS: 0.5000 mg | ORAL_TABLET | Freq: Every day | ORAL | 0 refills | Status: DC

## 2023-08-15 MED ORDER — POLYETHYLENE GLYCOL 3350 17 G PO PACK: 17.0000 g | PACK | Freq: Every day | ORAL | Status: AC

## 2023-08-15 MED ORDER — TRAMADOL HCL 50 MG PO TABS: 50.0000 mg | ORAL_TABLET | Freq: Three times a day (TID) | ORAL | 0 refills | Status: DC | PRN

## 2023-08-15 MED ORDER — BISACODYL 10 MG RE SUPP
10.0000 mg | Freq: Every day | RECTAL | Status: AC | PRN
Start: 2023-08-15 — End: ?

## 2023-08-15 NOTE — Plan of Care (Signed)
  Problem: Clinical Measurements: Goal: Ability to maintain clinical measurements within normal limits will improve Outcome: Progressing Goal: Will remain free from infection Outcome: Progressing Goal: Diagnostic test results will improve Outcome: Progressing Goal: Respiratory complications will improve Outcome: Progressing Goal: Cardiovascular complication will be avoided Outcome: Progressing   Problem: Coping: Goal: Level of anxiety will decrease Outcome: Progressing   Problem: Elimination: Goal: Will not experience complications related to bowel motility Outcome: Progressing

## 2023-08-15 NOTE — TOC Transition Note (Signed)
 Transition of Care [facility]) - Discharge Note   Patient Details  Name: [name] MRN: [id number] Date of Birth: [birth year]  [facility]) CM/SW Contact:  Crayton Docker, RN 08/15/2023, 9:46 AM   Clinical Narrative:     Discharge orders noted, Discharge summary noted. SNF transfer report, discharge orders, discharge summary faxed to [facility]. CM follow up call placed to Antony Baumgartner, Admissions, [facility] SNF regarding pending discharge and BLS transport time of 1100. Per Antony Baumgartner verbalized understanding and agreement. Per Antony Baumgartner, patient will admit to room 602 and RN report number is [phone]. CM alert to RN Magdaleno Schooling regarding patient SNF room number assignment and RN report number.   Final next level of care: Skilled Nursing Facility Barriers to Discharge: No Barriers Identified   Patient Goals and CMS Choice Patient states their goals for this hospitalization and ongoing recovery are:: rehab    [facility] or [facility] [place]  Discharge Placement   SNF: [facility]  Discharge Plan and Services Additional resources added to the After Visit Summary for     Discharge Planning Services: CM Consult              Social Drivers of Health (SDOH) Interventions SDOH Screenings   Food Insecurity: No Food Insecurity (08/10/2023)  Housing: Low Risk  (08/10/2023)  Transportation Needs: No Transportation Needs (08/10/2023)  Utilities: Not At Risk (08/10/2023)  Social Connections: Unknown (08/10/2023)  Tobacco Use: Medium Risk (08/09/2023)     Readmission Risk Interventions     No data to display

## 2023-08-15 NOTE — Discharge Summary (Addendum)
 Physician Discharge Summary   Patient: [name] MRN: [id number]  DOB: 1950/07/19   Admit:     Date of Admission: 08/09/2023 Admitted from: ALF/ILF   Discharge: Date of discharge: 08/15/23 Disposition: Skilled nursing facility Condition at discharge: good  CODE STATUS: FULL CODE     Discharge Physician: Melodi Sprung, DO [facility]     PCP: [facility]  Recommendations for Outpatient Follow-up:  Follow up with PCP [facility] in 1-2 weeks Referral to orthopedics to follow up on L hip arthroplasty w/ potential occult fracture   Discharge Instructions     Ambulatory referral to Orthopedic Surgery   Complete by: As directed    [facility]  [phone]         Discharge Diagnoses: Principal Problem:   Fall Active Problems:   Generalized weakness   Acute cystitis without hematuria   AKI (acute kidney injury) (HCC)   Hyperlipidemia, unspecified   HTN (hypertension)   Chronic pain   Anxiety and depression   Memory loss    Hospital course / significant events:   HPI: 73 y.o. female with medical history significant of Overactive bladder, hypertension, hyperlipidemia, recurrent falls, fibromyalgia, who presents to the ED due to ground-level fall.  04/12: admitted to hospitalist, fall, AKI, UTI 04/13: PT recs rehab. CT pelvis showed postop left hip arthroplasty cannot exclude nondisplaced fracture of the proximal left femur due to artifact from the metallic hardware correlate with left hip radiograph. Hospitalist d/w ortho, recs for outpatient f/u. Patient is an unsafe discharge home. 04/14.  Today patient able to straight leg raise without using her arms.  Creatinine down to 0.96.  E. coli growing out of urine culture sensitive to Rocephin . 04/15-04/17: pending SNF placement for rehab     Consultants:  None formal, informal d/w orthopedics see hospital course    Procedures/Surgeries: none      ASSESSMENT & PLAN:   Fall w/ possible microfracture at L hip arthroplasty Generalized weakness Debility SNF rehab, PT/OT as able Follow outpatient with orthopedics Pain control    Acute cystitis without hematuria, (+)Ecoli likely one of the major factors in her generalized weakness and fall. E. coli growing out of urine cultures.   S/p Rocephin  x5 days total   AKI (acute kidney injury) resolved  Creatinine 1.16 on presentation down to 0.96 with fluids.  Baseline creatinine 0.71.   Memory loss stable Patient is at independent living at [facility] memory care.  Will need to walk in order to go back to that level care.   Anxiety and depression Continue home bupropion , sertraline , Klonopin .     Chronic pain Continue home tramadol    HTN (hypertension) Hold blood pressure medications.  Last blood pressure normal range.   Hyperlipidemia, unspecified statin holiday with generalized weaknes    Class 1 obesity based on BMI: Body mass index is 32.34 kg/m.  Underweight - under 18  overweight - 25 to 29 obese - 30 or more Class 1 obesity: BMI of 30.0 to 34 Class 2 obesity: BMI of 35.0 to 39 Class 3 obesity: BMI of 40.0 to 49 Super Morbid Obesity: BMI 50-59 Super-super Morbid Obesity: BMI 60+ Significantly low or high BMI is associated with higher medical risk.  Weight management advised as adjunct to other disease management and risk reduction treatments             Discharge Instructions  Allergies as of 08/15/2023  Reactions   Erythromycin Other (See Comments)   Upset stomach   Methocarbamol Nausea Only   Prednisone    Sulfa Antibiotics Other (See Comments)   Turns eyes red        Medication List     STOP taking these medications    ascorbic acid 500 MG tablet Commonly known as: VITAMIN C   fluticasone 50 MCG/ACT nasal spray Commonly known as: FLONASE   furosemide 20 MG tablet Commonly known as:  LASIX   lisinopril 10 MG tablet Commonly known as: ZESTRIL   metoCLOPramide  10 MG tablet Commonly known as: REGLAN    potassium chloride  SA 20 MEQ tablet Commonly known as: KLOR-CON  M       TAKE these medications    acetaminophen  325 MG tablet Commonly known as: TYLENOL  Take 325 mg by mouth every 4 (four) hours as needed.   Baclofen 5 MG Tabs Take 1 tablet by mouth every 8 (eight) hours as needed (back pain).   bisacodyl  10 MG suppository Commonly known as: Bisacodyl  Laxative Place 1 suppository (10 mg total) rectally daily as needed for moderate constipation or severe constipation.   buPROPion  ER 100 MG 12 hr tablet Commonly known as: WELLBUTRIN  SR Take 100 mg by mouth 2 (two) times daily. What changed: Another medication with the same name was removed. Continue taking this medication, and follow the directions you see here.   cetirizine 10 MG tablet Commonly known as: ZYRTEC Take 10 mg by mouth daily as needed for allergies.   clonazePAM  0.5 MG tablet Commonly known as: KLONOPIN  Take 1 tablet (0.5 mg total) by mouth at bedtime.   Colace 100 MG capsule Generic drug: docusate sodium  Take 100 mg by mouth daily.   cyanocobalamin 1000 MCG tablet Commonly known as: VITAMIN B12 Take 1,000 mcg by mouth daily.   estradiol 0.1 MG/24HR patch Commonly known as: VIVELLE-DOT Place 1 patch onto the skin 2 (two) times a week.   Fish Oil 300 MG Caps Take 1 capsule by mouth daily.   folic acid 400 MCG tablet Commonly known as: FOLVITE Take 400 mcg by mouth daily.   Lidocaine  Pain Relief 4 % Generic drug: lidocaine  Place 1 patch onto the skin daily. Apply 1 patch topically to lower back once a day on for 12 hours and off for 12 hours.   omeprazole 20 MG capsule Commonly known as: PRILOSEC Take 20 mg by mouth daily.   ondansetron  4 MG disintegrating tablet Commonly known as: ZOFRAN -ODT Take 1 tablet (4 mg total) by mouth every 8 (eight) hours as needed for nausea or  vomiting.   polyethylene glycol 17 g packet Commonly known as: MiraLax  Take 17 g by mouth daily.   pravastatin  80 MG tablet Commonly known as: PRAVACHOL  Take 80 mg by mouth at bedtime.   sertraline  100 MG tablet Commonly known as: ZOLOFT  Take 150 mg by mouth daily.   traMADol  50 MG tablet Commonly known as: Ultram  Take 1 tablet (50 mg total) by mouth every 8 (eight) hours as needed for moderate pain (pain score 4-6) or severe pain (pain score 7-10). What changed:  when to take this reasons to take this   Trospium  Chloride 60 MG Cp24 Take 1 capsule (60 mg total) by mouth daily.   Vitamin D 50 MCG (2000 UT) tablet Take 2,000 Units by mouth daily.         Contact information for follow-up providers     Claiborne Crew, MD Follow up in 2 week(s).   Specialty:  Orthopedic Surgery Contact information: [address] [place] [address] [phone]         [facility] Follow up.   Why: Hospital follow up Contact information: [address] [place] [address] [phone]              Contact information for after-discharge care     Destination     [facility] Preferred SNF .   Service: Skilled Nursing Contact information: [address] [place]  [address] [phone]                     Allergies  Allergen Reactions   Erythromycin Other (See Comments)    Upset stomach   Methocarbamol Nausea Only   Prednisone    Sulfa Antibiotics Other (See Comments)    Turns eyes red     Subjective: pt is participating w/ PT/OT, tolerating diet, pain controlled, no concerns w/ voiding, no CP/SOB, no N/V   Discharge Exam: BP 120/63 (BP Location: Right Arm)   Pulse 67   Temp 97.7 F (36.5 C) (Oral)   Resp 19   Ht 5\' 2"  (1.575 m)   Wt 80.2 kg   SpO2 93%   BMI 32.34 kg/m  General: Pt is alert, awake, not in  acute distress Cardiovascular: RRR, S1/S2 + Respiratory: CTA bilaterally, no wheezing, no rhonchi Abdominal: Soft, NT, ND, bowel sounds + Extremities: no edema, no cyanosis     The results of significant diagnostics from this hospitalization (including imaging, microbiology, ancillary and laboratory) are listed below for reference.     Microbiology: Recent Results (from the past 240 hours)  Urine Culture     Status: Abnormal   Collection Time: 08/09/23  3:10 PM   Specimen: Urine, Random  Result Value Ref Range Status   Specimen Description   Final    URINE, RANDOM Performed at [facility], [address] [place], [place] [address]    Special Requests   Final    URINE, CLEAN CATCH Performed at [facility], [address], [place], [place] [address]    Culture >=100,000 COLONIES/mL ESCHERICHIA COLI (A)  Final   Report Status 08/11/2023 FINAL  Final   Organism ID, Bacteria ESCHERICHIA COLI (A)  Final      Susceptibility   Escherichia coli - MIC*    AMPICILLIN >=32 RESISTANT Resistant     CEFAZOLIN  16 SENSITIVE Sensitive     CEFEPIME <=0.12 SENSITIVE Sensitive     CEFTRIAXONE  <=0.25 SENSITIVE Sensitive     CIPROFLOXACIN <=0.25 SENSITIVE Sensitive     GENTAMICIN >=16 RESISTANT Resistant     IMIPENEM <=0.25 SENSITIVE Sensitive     NITROFURANTOIN <=16 SENSITIVE Sensitive     TRIMETH/SULFA >=320 RESISTANT Resistant     AMPICILLIN/SULBACTAM >=32 RESISTANT Resistant     PIP/TAZO 64 INTERMEDIATE Intermediate ug/mL    * >=100,000 COLONIES/mL ESCHERICHIA COLI     Labs: BNP (last 3 results) No results for input(s): "BNP" in the last 8760 hours. Basic Metabolic Panel: Recent Labs  Lab 08/09/23 1217 08/10/23 0316 08/11/23 0538 08/13/23 0301  NA 136 135 139 140  K 4.8 3.9 3.3* 3.6  CL 104 103 109 109  CO2 24 20* 24 25  GLUCOSE 103* 81 92 87  BUN 23 18 18 14   CREATININE 1.16* 1.05* 0.96 0.79  CALCIUM 9.3 8.8* 8.8* 8.6*   Liver Function Tests: No  results for input(s): "AST", "ALT", "ALKPHOS", "BILITOT", "PROT", "ALBUMIN" in the last 168 hours. No results for input(s): "LIPASE", "AMYLASE" in the last 168 hours. No results for input(s): "AMMONIA" in the last 168 hours. CBC: Recent Labs  Lab 08/09/23 1217 08/10/23 0316 08/11/23 0538 08/13/23 0301  WBC 6.2 6.2 5.2 6.1  HGB 12.6 11.5* 10.7* 11.0*  HCT 37.7 34.4* 31.1* 33.0*  MCV 90.4 90.5 90.1 90.4  PLT 190 194 161 182   Cardiac Enzymes: Recent Labs  Lab 08/10/23 0316  CKTOTAL 69   BNP: Invalid input(s): "POCBNP" CBG: No results for input(s): "GLUCAP" in the last 168 hours. D-Dimer No results for input(s): "DDIMER" in the last 72 hours. Hgb A1c No results for input(s): "HGBA1C" in the last 72 hours. Lipid Profile No results for input(s): "CHOL", "HDL", "LDLCALC", "TRIG", "CHOLHDL", "LDLDIRECT" in the last 72 hours. Thyroid function studies No results for input(s): "TSH", "T4TOTAL", "T3FREE", "THYROIDAB" in the last 72 hours.  Invalid input(s): "FREET3" Anemia work up No results for input(s): "VITAMINB12", "FOLATE", "FERRITIN", "TIBC", "IRON", "RETICCTPCT" in the last 72 hours. Urinalysis    Component Value Date/Time   COLORURINE YELLOW (A) 08/09/2023 1510   APPEARANCEUR HAZY (A) 08/09/2023 1510   APPEARANCEUR Clear 03/18/2023 1423   LABSPEC 1.009 08/09/2023 1510   PHURINE 6.0 08/09/2023 1510   GLUCOSEU NEGATIVE 08/09/2023 1510   HGBUR NEGATIVE 08/09/2023 1510   BILIRUBINUR NEGATIVE 08/09/2023 1510   BILIRUBINUR Negative 03/18/2023 1423   KETONESUR NEGATIVE 08/09/2023 1510   PROTEINUR NEGATIVE 08/09/2023 1510   UROBILINOGEN 0.2 10/14/2019 1011   UROBILINOGEN 0.2 07/24/2013 1451   NITRITE NEGATIVE 08/09/2023 1510   LEUKOCYTESUR LARGE (A) 08/09/2023 1510   Sepsis Labs Recent Labs  Lab 08/09/23 1217 08/10/23 0316 08/11/23 0538 08/13/23 0301  WBC 6.2 6.2 5.2 6.1   Microbiology Recent Results (from the past 240 hours)  Urine Culture     Status:  Abnormal   Collection Time: 08/09/23  3:10 PM   Specimen: Urine, Random  Result Value Ref Range Status   Specimen Description   Final    URINE, RANDOM Performed at [facility], [address], [place], [place] [address]    Special Requests   Final    URINE, CLEAN CATCH Performed at [facility], [address], [place], [place] [address]    Culture >=100,000 COLONIES/mL ESCHERICHIA COLI (A)  Final   Report Status 08/11/2023 FINAL  Final   Organism ID, Bacteria ESCHERICHIA COLI (A)  Final      Susceptibility   Escherichia coli - MIC*    AMPICILLIN >=32 RESISTANT Resistant     CEFAZOLIN  16 SENSITIVE Sensitive     CEFEPIME <=0.12 SENSITIVE Sensitive     CEFTRIAXONE  <=0.25 SENSITIVE Sensitive     CIPROFLOXACIN <=0.25 SENSITIVE Sensitive     GENTAMICIN >=16 RESISTANT Resistant     IMIPENEM <=0.25 SENSITIVE Sensitive     NITROFURANTOIN <=16 SENSITIVE Sensitive     TRIMETH/SULFA >=320 RESISTANT Resistant     AMPICILLIN/SULBACTAM >=32 RESISTANT Resistant     PIP/TAZO 64 INTERMEDIATE Intermediate ug/mL    * >=100,000 COLONIES/mL ESCHERICHIA COLI   Imaging CT PELVIS WO CONTRAST Result Date: 08/09/2023 CLINICAL DATA:  Trauma EXAM: CT PELVIS WITHOUT CONTRAST TECHNIQUE: Multidetector CT imaging of the pelvis was performed following the standard protocol without intravenous contrast. RADIATION DOSE REDUCTION: This exam was performed according to the departmental dose-optimization program which includes automated exposure control, adjustment of the mA and/or kV according to patient size and/or use  of iterative reconstruction technique. COMPARISON:  10/10/2022. FINDINGS: Urinary Tract:  No abnormality visualized. Bowel:  Unremarkable visualized pelvic bowel loops. Vascular/Lymphatic: No pathologically enlarged lymph nodes. Atheromatous calcifications of the aorta and its branches. Reproductive:  Status post hysterectomy. Other: Anterior abdominal periumbilical defect contains omental  fat without incarceration or herniated bowel. Musculoskeletal: Postop left hip arthroplasty. Cannot exclude nondisplaced fracture of the proximal left femur due to artifact from the metallic hardware. Degenerative changes are seen. IMPRESSION: 1. Postop left hip arthroplasty. Cannot exclude a nondisplaced fracture of the proximal left femur due to artifact from the metallic hardware. If there is clinical concern for a fracture, recommend follow-up with dedicated left hip radiographs. 2. No other acute abnormalities. 3. Aortic atherosclerosis. Electronically Signed   By: Sydell Eva M.D.   On: 08/09/2023 14:48   CT Head Wo Contrast Result Date: 08/09/2023 CLINICAL DATA:  Head trauma, minor (Age >= 65y). EXAM: CT HEAD WITHOUT CONTRAST TECHNIQUE: Contiguous axial images were obtained from the base of the skull through the vertex without intravenous contrast. RADIATION DOSE REDUCTION: This exam was performed according to the departmental dose-optimization program which includes automated exposure control, adjustment of the mA and/or kV according to patient size and/or use of iterative reconstruction technique. COMPARISON:  Head CT 01/02/2022 FINDINGS: Brain: There is no evidence of an acute infarct, intracranial hemorrhage, mass, midline shift, or extra-axial fluid collection. There is mild-to-moderate cerebral atrophy. Patchy cerebral white matter hypodensities may have mildly progressed and are nonspecific but compatible with mild-to-moderate chronic small vessel ischemic disease. 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 Vascular: L5 atherosclerosis at the skull base. No hyperdense vessel. Skull: No acute fracture or suspicious lesion. Sinuses/Orbits: Visualized paranasal sinuses and mastoid air cells are clear. Unremarkable orbits. Other: None. IMPRESSION: 1. No evidence of acute intracranial abnormality. 2. Mild-to-moderate chronic small vessel ischemic disease and cerebral atrophy. Electronically  Signed   By: Aundra Lee M.D.   On: 08/09/2023 13:43   DG Chest Portable 1 View Result Date: 08/09/2023 CLINICAL DATA:  [name] EXAM: PORTABLE CHEST - 1 VIEW COMPARISON:  02/18/2012 FINDINGS: Multiple 2 mm densities project over the right upper lung and mediastinum in a somewhat geometric distribution presumably overlying external artifact. Lungs are otherwise clear. No pneumothorax. Heart size and mediastinal contours are within normal limits. Aortic Atherosclerosis (ICD10-170.0). No effusion. Old right fifth rib fracture deformity.  No acute findings. IMPRESSION: 1. No convincing acute cardiopulmonary disease. Electronically Signed   By: Nicoletta Barrier M.D.   On: 08/09/2023 13:22   DG FEMUR MIN 2 VIEWS LEFT Result Date: 08/09/2023 CLINICAL DATA:  [id number] Fall [id number] EXAM: LEFT FEMUR 2 VIEWS COMPARISON:  10/10/2022 FINDINGS: Left hip arthroplasty components stable in position. Knee arthroplasty components project in expected location, the tibial component incompletely visualized. No fracture or dislocation. Regional soft tissues unremarkable. IMPRESSION: 1. No acute findings. 2. Left hip and knee arthroplasty components. Electronically Signed   By: Nicoletta Barrier M.D.   On: 08/09/2023 13:20   DG Hip Unilat With Pelvis 2-3 Views Left Result Date: 08/09/2023 CLINICAL DATA:  [name] EXAM: DG HIP (WITH OR WITHOUT PELVIS) 2-3V LEFT COMPARISON:  08/21/2019 FINDINGS: Left hip arthroplasty components stable in position. No fracture or dislocation. Bony pelvis intact. Mild spondylitic changes in the visualized lower lumbar spine. IMPRESSION: 1. No acute findings. 2. Left hip arthroplasty. Electronically Signed   By: Nicoletta Barrier M.D.   On: 08/09/2023 13:19      Time coordinating discharge: over 30 minutes  SIGNED:  Labron Bloodgood DO [facility]

## 2023-09-25 ENCOUNTER — Other Ambulatory Visit: Payer: Self-pay

## 2023-09-25 ENCOUNTER — Emergency Department

## 2023-09-25 ENCOUNTER — Inpatient Hospital Stay
Admission: EM | Admit: 2023-09-25 | Discharge: 2023-09-29 | DRG: 535 | Disposition: A | Discharge status: Skilled Nursing Facility | Source: Skilled Nursing Facility | Attending: Internal Medicine | Admitting: Internal Medicine

## 2023-09-25 DIAGNOSIS — M797 Fibromyalgia: Secondary | ICD-10-CM | POA: Diagnosis present

## 2023-09-25 DIAGNOSIS — Z96651 Presence of right artificial knee joint: Secondary | ICD-10-CM | POA: Diagnosis present

## 2023-09-25 DIAGNOSIS — S7292XA Unspecified fracture of left femur, initial encounter for closed fracture: Secondary | ICD-10-CM | POA: Insufficient documentation

## 2023-09-25 DIAGNOSIS — K219 Gastro-esophageal reflux disease without esophagitis: Secondary | ICD-10-CM | POA: Diagnosis present

## 2023-09-25 DIAGNOSIS — I10 Essential (primary) hypertension: Secondary | ICD-10-CM | POA: Diagnosis present

## 2023-09-25 DIAGNOSIS — F32A Depression, unspecified: Secondary | ICD-10-CM | POA: Diagnosis present

## 2023-09-25 DIAGNOSIS — F419 Anxiety disorder, unspecified: Secondary | ICD-10-CM | POA: Diagnosis present

## 2023-09-25 DIAGNOSIS — J9601 Acute respiratory failure with hypoxia: Secondary | ICD-10-CM | POA: Insufficient documentation

## 2023-09-25 DIAGNOSIS — R52 Pain, unspecified: Secondary | ICD-10-CM | POA: Diagnosis present

## 2023-09-25 DIAGNOSIS — Z881 Allergy status to other antibiotic agents status: Secondary | ICD-10-CM

## 2023-09-25 DIAGNOSIS — T40415A Adverse effect of fentanyl or fentanyl analogs, initial encounter: Secondary | ICD-10-CM | POA: Diagnosis present

## 2023-09-25 DIAGNOSIS — Z90710 Acquired absence of both cervix and uterus: Secondary | ICD-10-CM

## 2023-09-25 DIAGNOSIS — Z96642 Presence of left artificial hip joint: Secondary | ICD-10-CM | POA: Diagnosis present

## 2023-09-25 DIAGNOSIS — E876 Hypokalemia: Secondary | ICD-10-CM | POA: Diagnosis present

## 2023-09-25 DIAGNOSIS — M9702XA Periprosthetic fracture around internal prosthetic left hip joint, initial encounter: Principal | ICD-10-CM

## 2023-09-25 DIAGNOSIS — Z79899 Other long term (current) drug therapy: Secondary | ICD-10-CM

## 2023-09-25 DIAGNOSIS — G250 Essential tremor: Secondary | ICD-10-CM | POA: Diagnosis present

## 2023-09-25 DIAGNOSIS — W19XXXA Unspecified fall, initial encounter: Secondary | ICD-10-CM | POA: Diagnosis present

## 2023-09-25 DIAGNOSIS — S72145A Nondisplaced intertrochanteric fracture of left femur, initial encounter for closed fracture: Principal | ICD-10-CM | POA: Diagnosis present

## 2023-09-25 DIAGNOSIS — G8929 Other chronic pain: Secondary | ICD-10-CM | POA: Diagnosis present

## 2023-09-25 DIAGNOSIS — E785 Hyperlipidemia, unspecified: Secondary | ICD-10-CM | POA: Diagnosis present

## 2023-09-25 MED ORDER — ACETAMINOPHEN 650 MG RE SUPP: 650.0000 mg | Freq: Four times a day (QID) | RECTAL | Status: DC | PRN

## 2023-09-25 MED ORDER — DOCUSATE SODIUM 100 MG PO CAPS
100.0000 mg | ORAL_CAPSULE | Freq: Every day | ORAL | Status: DC
  Administered 2023-09-25 – 2023-09-29 (×5): 100 mg via ORAL
  Filled 2023-09-25 (×5): qty 1

## 2023-09-25 MED ORDER — MORPHINE SULFATE (PF) 2 MG/ML IV SOLN
2.0000 mg | INTRAVENOUS | Status: DC | PRN
  Administered 2023-09-25: 2 mg via INTRAVENOUS
  Filled 2023-09-25: qty 1

## 2023-09-25 MED ORDER — ENOXAPARIN SODIUM 40 MG/0.4ML IJ SOSY
40.0000 mg | PREFILLED_SYRINGE | INTRAMUSCULAR | Status: DC
  Administered 2023-09-25 – 2023-09-28 (×4): 40 mg via SUBCUTANEOUS
  Filled 2023-09-25 (×4): qty 0.4

## 2023-09-25 MED ORDER — PRAVASTATIN SODIUM 20 MG PO TABS
80.0000 mg | ORAL_TABLET | Freq: Every day | ORAL | Status: DC
  Administered 2023-09-25 – 2023-09-28 (×4): 80 mg via ORAL
  Filled 2023-09-25 (×4): qty 4

## 2023-09-25 MED ORDER — MORPHINE SULFATE (PF) 4 MG/ML IV SOLN: 4.0000 mg | INTRAVENOUS | Status: DC | PRN

## 2023-09-25 MED ORDER — SERTRALINE HCL 50 MG PO TABS
150.0000 mg | ORAL_TABLET | Freq: Every day | ORAL | Status: DC
  Administered 2023-09-26 – 2023-09-29 (×4): 150 mg via ORAL
  Filled 2023-09-25 (×4): qty 3

## 2023-09-25 MED ORDER — ACETAMINOPHEN 325 MG PO TABS: 650.0000 mg | ORAL_TABLET | Freq: Four times a day (QID) | ORAL | Status: DC | PRN

## 2023-09-25 MED ORDER — BUPROPION HCL ER (SR) 100 MG PO TB12
100.0000 mg | ORAL_TABLET | Freq: Two times a day (BID) | ORAL | Status: DC
  Administered 2023-09-26 – 2023-09-29 (×7): 100 mg via ORAL
  Filled 2023-09-25 (×10): qty 1

## 2023-09-25 MED ORDER — CLONAZEPAM 0.5 MG PO TABS
0.5000 mg | ORAL_TABLET | Freq: Every day | ORAL | Status: DC
  Administered 2023-09-25 – 2023-09-28 (×4): 0.5 mg via ORAL
  Filled 2023-09-25 (×4): qty 1

## 2023-09-25 MED ORDER — ONDANSETRON HCL 4 MG PO TABS: 4.0000 mg | ORAL_TABLET | Freq: Four times a day (QID) | ORAL | Status: DC | PRN

## 2023-09-25 MED ORDER — SENNOSIDES-DOCUSATE SODIUM 8.6-50 MG PO TABS
1.0000 | ORAL_TABLET | Freq: Every evening | ORAL | Status: DC | PRN
  Administered 2023-09-26 – 2023-09-28 (×3): 1 via ORAL
  Filled 2023-09-25 (×3): qty 1

## 2023-09-25 MED ORDER — MORPHINE SULFATE (PF) 2 MG/ML IV SOLN: 2.0000 mg | INTRAVENOUS | Status: DC | PRN

## 2023-09-25 MED ORDER — TRAMADOL HCL 50 MG PO TABS
50.0000 mg | ORAL_TABLET | Freq: Three times a day (TID) | ORAL | Status: DC | PRN
  Administered 2023-09-25 – 2023-09-28 (×5): 50 mg via ORAL
  Filled 2023-09-25 (×5): qty 1

## 2023-09-25 MED ORDER — HYDRALAZINE HCL 20 MG/ML IJ SOLN: 5.0000 mg | Freq: Four times a day (QID) | INTRAMUSCULAR | Status: DC | PRN

## 2023-09-25 MED ORDER — VITAMIN B-12 1000 MCG PO TABS
1000.0000 ug | ORAL_TABLET | Freq: Every day | ORAL | Status: DC
  Administered 2023-09-26 – 2023-09-29 (×4): 1000 ug via ORAL
  Filled 2023-09-25 (×4): qty 1

## 2023-09-25 MED ORDER — BISACODYL 10 MG RE SUPP
10.0000 mg | Freq: Every day | RECTAL | Status: DC | PRN
  Filled 2023-09-25: qty 1

## 2023-09-25 MED ORDER — ONDANSETRON HCL 4 MG/2ML IJ SOLN: 4.0000 mg | Freq: Four times a day (QID) | INTRAMUSCULAR | Status: DC | PRN

## 2023-09-25 MED ORDER — POLYETHYLENE GLYCOL 3350 17 G PO PACK
17.0000 g | PACK | Freq: Every day | ORAL | Status: DC
  Administered 2023-09-26 – 2023-09-29 (×4): 17 g via ORAL
  Filled 2023-09-25 (×4): qty 1

## 2023-09-25 NOTE — ED Notes (Signed)
 Pt eating dinner meal  ?

## 2023-09-25 NOTE — Assessment & Plan Note (Signed)
Hydralazine 5 mg IV every 6 hours as needed for SBP greater than 170, 5 days ordered

## 2023-09-25 NOTE — ED Triage Notes (Signed)
 After fentanyl  from [facility] pt oxygen 84%. Pt placed on 4L [place] with improvement to 95%.

## 2023-09-25 NOTE — Consult Note (Signed)
 ORTHOPAEDIC CONSULTATION  REQUESTING PHYSICIAN: Collis Deaner, MD  Chief Complaint:   Left hip injury  History of Present Illness: [name] is a 73 y.o. female with a history of anxiety, fibromyalgia, hypertension history of memory issues recently per son who presented from [facility] after a fall from standing.  Per patient and son she is minimally ambulatory at baseline with a rolling walker ever since a fall a little over a month ago.  She was discharged from the skilled nursing facility but simply came home just a few days ago and had this fall follow-up with acute fall while trying to ambulate across the room by herself.  She reports a history of left hip replacement over 20 years ago and that she had not been having any issues with her left hip or leg prior to the fall.  Any numbness or tingling in the leg at this time and localizes pain over the lateral aspect of her hip.  She is alert and oriented to person and that she is in the hospital.  No chest pain or shortness of breath at this time.  Past Medical History:  Diagnosis Date   Abnormal EKG    PREOP EKG -NSR, CANNOT RULE OUT INFERIOR INFARCT AGE -INDETERMIINATE--FOLLOWED UP BY PT'S MEDICAL DOCTOR WITH ECHOCARDIOGRAM AND PT CLEARED FOR LEFT TOTAL KNEE ARTHROPLASTY SURGERY.   Anxiety    Arthritis    Depression    Fibromyalgia    GERD (gastroesophageal reflux disease)    Glaucoma    Hyperlipidemia    Hypertension    IBS (irritable bowel syndrome)    Incontinence of urine    PONV (postoperative nausea and vomiting)    Shortness of breath    WITH EXERTION   Umbilical hernia    NO PAIN   Past Surgical History:  Procedure Laterality Date   ABDOMINAL HYSTERECTOMY  1990   APPENDECTOMY  1955   CHOLECYSTECTOMY  2007   EYE SURGERY     LASER EYE SURGERY -LEFT FOR TX GLAUCOMA   JOINT REPLACEMENT     LEFT TOTAL HIP REPLACEMENT 1998,  RIGHT TOTAL KNEE  REPLACEMENT OCT 2012   LAPAROSCOPY TO REMOVE SCAR TISSUE  1991   LEFT KNEE ARTHROSCOPY 2012     RT SHOULDER ARTHROSCOPY  2011     TONSILLECTOMY     TOTAL KNEE ARTHROPLASTY  02/20/2012   Procedure: TOTAL KNEE ARTHROPLASTY;  Surgeon: Bevin Bucks, MD;  Location: [facility];  Service: Orthopedics;  Laterality: Left;   TRANSVAGINAL TAPE  2000   Social History   Socioeconomic History   Marital status: Married    Spouse name: Not on file   Number of children: 2   Years of education: 12   Highest education level: Not on file  Occupational History   Occupation: Retired   Tobacco Use   Smoking status: Former    Types: Cigarettes   Smokeless tobacco: Never   Tobacco comments:    QUIT SMOKING 18 YRS AGO  1995  Vaping Use   Vaping status: Never Used  Substance and Sexual Activity   Alcohol use: No   Drug use: No   Sexual activity: Not Currently  Other Topics Concern   Not on file  Social History Narrative   Lives alone   Caffeine use: none   Right handed   Social Drivers of Health   Financial Resource Strain: Not on file  Food Insecurity: No Food Insecurity (08/10/2023)   Hunger Vital Sign  Worried About Programme researcher, broadcasting/film/video in the Last Year: Never true    Ran Out of Food in the Last Year: Never true  Transportation Needs: No Transportation Needs (08/10/2023)   PRAPARE - Administrator, Civil Service (Medical): No    Lack of Transportation (Non-Medical): No  Physical Activity: Not on file  Stress: Not on file  Social Connections: Unknown (08/10/2023)   Social Connection and Isolation Panel [NHANES]    Frequency of Communication with Friends and Family: Never    Frequency of Social Gatherings with Friends and Family: Never    Attends Religious Services: Never    Database administrator or Organizations: No    Attends Engineer, structural: Never    Marital Status: Patient declined   Family History  Problem Relation Age of Onset   Bone cancer Mother     Arthritis Mother    Cancer Mother    Parkinsonism Mother    Arthritis Father    Stroke Father    Diabetes Father    Breast cancer Daughter    Stroke Sister    Parkinsonism Brother    Diabetes Brother    Allergies  Allergen Reactions   Erythromycin Other (See Comments)    Upset stomach   Methocarbamol Nausea Only   Prednisone    Sulfa Antibiotics Other (See Comments)    Turns eyes red   Prior to Admission medications   Medication Sig Start Date End Date Taking? Authorizing Provider  acetaminophen  (TYLENOL ) 325 MG tablet Take 325 mg by mouth every 4 (four) hours as needed. 12/26/22   [provider]  Baclofen 5 MG TABS Take 1 tablet by mouth every 8 (eight) hours as needed (back pain). 03/06/23   [provider]  bisacodyl  (BISACODYL  LAXATIVE) 10 MG suppository Place 1 suppository (10 mg total) rectally daily as needed for moderate constipation or severe constipation. 08/15/23   Alexander, Natalie, DO  buPROPion  ER (WELLBUTRIN  SR) 100 MG 12 hr tablet Take 100 mg by mouth 2 (two) times daily. 07/21/23   [provider]  cetirizine (ZYRTEC) 10 MG tablet Take 10 mg by mouth daily as needed for allergies. 06/16/23   [provider]  Cholecalciferol (VITAMIN D) 50 MCG (2000 UT) tablet Take 2,000 Units by mouth daily.    [provider]  clonazePAM  (KLONOPIN ) 0.5 MG tablet Take 1 tablet (0.5 mg total) by mouth at bedtime. 08/15/23   Alexander, Natalie, DO  cyanocobalamin (VITAMIN B12) 1000 MCG tablet Take 1,000 mcg by mouth daily.    [provider]  docusate sodium  (COLACE) 100 MG capsule Take 100 mg by mouth daily.    [provider]  estradiol (VIVELLE-DOT) 0.1 MG/24HR patch Place 1 patch onto the skin 2 (two) times a week. 10/13/19   [provider]  folic acid (FOLVITE) 400 MCG tablet Take 400 mcg by mouth daily.    [provider]  LIDOCAINE  PAIN RELIEF 4 % Place 1 patch onto the skin daily. Apply 1 patch  topically to lower back once a day on for 12 hours and off for 12 hours. 12/30/22   [provider]  Omega-3 Fatty Acids (FISH OIL) 300 MG CAPS Take 1 capsule by mouth daily.    [provider]  omeprazole (PRILOSEC) 20 MG capsule Take 20 mg by mouth daily. 10/12/19   [provider]  ondansetron  (ZOFRAN -ODT) 4 MG disintegrating tablet Take 1 tablet (4 mg total) by mouth every 8 (eight) hours  as needed for nausea or vomiting. 10/10/22   Viviano Ground, MD  polyethylene glycol (MIRALAX ) 17 g packet Take 17 g by mouth daily. 08/15/23   Alexander, Natalie, DO  pravastatin  (PRAVACHOL ) 80 MG tablet Take 80 mg by mouth at bedtime.    [provider]  sertraline  (ZOLOFT ) 100 MG tablet Take 150 mg by mouth daily.    [provider]  traMADol  (ULTRAM ) 50 MG tablet Take 1 tablet (50 mg total) by mouth every 8 (eight) hours as needed for moderate pain (pain score 4-6) or severe pain (pain score 7-10). 08/15/23   Alexander, Natalie, DO  Trospium  Chloride 60 MG CP24 Take 1 capsule (60 mg total) by mouth daily. 03/18/23   Kathreen Pare, PA-C   DG Hip Unilat W or Wo Pelvis 2-3 Views Left Result Date: 09/25/2023 EXAM: 1 VIEW XRAY OF THE PELVIS 2 VIEWS XRAY OF THE LEFT FEMUR 09/25/2023 01:11:00 PM COMPARISON: 08/09/2023 CLINICAL HISTORY: FINDINGS: BONES AND JOINTS: A nondisplaced intertrochanteric fracture is present about the femoral prosthesis. The prosthesis is intact. The hip is located. No additional fractures are present. SOFT TISSUES: The soft tissues are unremarkable. IMPRESSION: 1. Nondisplaced intertrochanteric fracture about the femoral prosthesis. 2. Intact prosthesis and located hip. 3. No additional fractures. Electronically signed by: Audree Leas MD 09/25/2023 02:57 PM EDT RP Workstation: [id number]    Positive ROS: All other systems have been reviewed and were otherwise negative with the exception of those mentioned in the HPI and as  above.  Physical Exam: General:  Alert, no acute distress Psychiatric: Patient is alert and oriented to person and that she is in hospital Cardiovascular:  No pedal edema Respiratory:  No wheezing, non-labored breathing GI:  Abdomen is soft and non-tender Skin:  No lesions in the area of chief complaint Neurologic:  Sensation intact distally Lymphatic:  No axillary or cervical lymphadenopathy  Orthopedic Exam:  Left lower extremity Skin intact over the hip with a well-healed surgical incision with no erythema or signs of infection Tender to palpation over the greater trochanter No tenderness palpation of the distal femur, knee, tibia, ankle foot or toes Able to dorsiflex and plantarflex the foot and toes neurovascular intact with soft Endorses lateral hip pain with logroll.  No significant tenderness with distracted simulated axial load of the left leg.  X-rays:  X-rays and CT scan images myself.  There is a intertrochanteric periprosthetic fracture around the proximal aspect of the prior total hip replacement.  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  The distal aspect of the stem appears well fit with no subsidence compared to films 1 month ago.  No fracture lines noted extending distally around the femoral components or down the femoral shaft.  Assessment: Periprosthetic greater to lesser trochanter fracture around left prior total hip replacement  Plan: I reviewed the clinical and radiographic findings with the patient and her son.  We reviewed treatment options including surgical and nonsurgical options for this fracture.  The distal aspect of the stem does appear well fit in the canal with no evidence of motion compared to prior films.  There are chronic changes around hip replacement as well with evidence of osteolysis and plastic wear.  She did not have pain with distracted  axial load of the extremity.  We discussed potential risk of nonoperative management including fracture displacement or progression of the fracture, malunion, nonunion.  We also discussed  the surgical risks associated with open reduction, fixation and the potential need for full revision of the hip.  Given the patient's baseline ambulatory status and under shared decision making conversation with the patient and her son who elect to begin with a nonop with 50% partial weightbearing with a rolling walker and abduction precautions.  Will get repeat x-rays in 2 weeks to see how the fracture is doing to ensure no displacement of the femoral component.  We did discuss her largest risk is if she has another fall.  All questions answered patient and son agree with the above plan for outpatient follow-up    Venus Ginsberg MD  Beeper #:  [phone]  09/25/2023 4:18 PM

## 2023-09-25 NOTE — Assessment & Plan Note (Signed)
 PDMP reviewed Current active/most recent prescription: Clonazepam  0.5 mg, quantity 30, 30-day supply; tramadol  50 mg, 14, 5-day supply

## 2023-09-25 NOTE — ED Provider Notes (Signed)
  Physical Exam  BP (!) 171/84   Pulse 85   Temp 98.1 F (36.7 C) (Oral)   Resp 20   SpO2 94%   Physical Exam  Procedures  Procedures  ED Course / MDM   Clinical Course as of 09/25/23 1722  Thu Sep 25, 2023  1535 S/o from Dr. Ruven Coy: - 73 year old female with left hip pain after fall, x-ray periprosthetic fracture, orthopedics consulted [MM]  1540  D/w Dr. Clyda Dark of ortho, evaluated patient, spoke with family -Tentative plan for nonoperative management at this time, would like to proceed with CT hip to ensure no distal fractures.  If not, weightbearing as tolerated.  Do suspect patient would likely require SNF level care--recommends PT eval and SNF placement assuming CT with no distal fractures.  TO DO: - f/u CT - d/w ortho after CT results   [MM]  1618 Reviewed imaging, no distal fracture visualized.  Plan for 50% partial weightbearing as tolerated, proceed with plan for physical therapy eval and potential placement. [MM]  1620 SW and PT consult orders placed [MM]  1706 Hospitalist consult order placed regarding possible admission [MM]  1721 Discussed with hospitalist, agrees with admission [MM]    Clinical Course User Index [MM] Collis Deaner, MD   Medical Decision Making Amount and/or Complexity of Data Reviewed Radiology: ordered.  Risk Decision regarding hospitalization.          Collis Deaner, MD 09/25/23 [phone]

## 2023-09-25 NOTE — ED Notes (Addendum)
 Resumed care from les paramedic.  This rn went into room to find , iv pulled out, all equipment off pt.  Pt states she wants to go and doesn't want anything on her.  Son just arrived.  Dr Vicenta Graft aware.  He states no iv at this time.  Pt alert  siderails up x 2   pt has left hip pain.  Orthopedist now in room with pt and family.  Ct of hip ordered now.

## 2023-09-25 NOTE — ED Provider Notes (Signed)
 [facility] Provider Note    Event Date/Time   First MD Initiated Contact with Patient 09/25/23 1245     (approximate)   History   Chief Complaint: No chief complaint on file.   HPI  [name] is a 73 y.o. female with a history of anxiety, fibromyalgia, hypertension who was in her usual state of health, had a fall from standing today at her SNF.  Complains of left hip pain.  Denies head injury or other complaints.  EMS gave fentanyl  for pain relief, after which she had hypoventilation and some hypoxia.  Denies chest pain or shortness of breath.        Past Medical History:  Diagnosis Date   Abnormal EKG    PREOP EKG -NSR, CANNOT RULE OUT INFERIOR INFARCT AGE -INDETERMIINATE--FOLLOWED UP BY PT'S MEDICAL DOCTOR WITH ECHOCARDIOGRAM AND PT CLEARED FOR LEFT TOTAL KNEE ARTHROPLASTY SURGERY.   Anxiety    Arthritis    Depression    Fibromyalgia    GERD (gastroesophageal reflux disease)    Glaucoma    Hyperlipidemia    Hypertension    IBS (irritable bowel syndrome)    Incontinence of urine    PONV (postoperative nausea and vomiting)    Shortness of breath    WITH EXERTION   Umbilical hernia    NO PAIN    Current Outpatient Rx   Order #: [id number] Class: Historical Med   Order #: [id number] Class: Historical Med   Order #: [id number] Class: No Print   Order #: [id number] Class: Historical Med   Order #: [id number] Class: Historical Med   Order #: [id number] Class: Historical Med   Order #: [id number] Class: Print   Order #: [id number] Class: Historical Med   Order #: [id number] Class: Historical Med   Order #: [id number] Class: Historical Med   Order #: [id number] Class: Historical Med   Order #: [id number] Class: Historical Med   Order #: [id number] Class: Historical Med   Order #: [id number] Class: Historical Med   Order #: [id number] Class: Print   Order #: [id number] Class: No Print   Order #: [id number] Class: Historical Med   Order #: [id number] Class:  Historical Med   Order #: [id number] Class: Print   Order #: [id number] Class: Normal    Past Surgical History:  Procedure Laterality Date   ABDOMINAL HYSTERECTOMY  1990   APPENDECTOMY  1955   CHOLECYSTECTOMY  2007   EYE SURGERY     LASER EYE SURGERY -LEFT FOR TX GLAUCOMA   JOINT REPLACEMENT     LEFT TOTAL HIP REPLACEMENT 1998,  RIGHT TOTAL KNEE REPLACEMENT OCT 2012   LAPAROSCOPY TO REMOVE SCAR TISSUE  1991   LEFT KNEE ARTHROSCOPY 2012     RT SHOULDER ARTHROSCOPY  2011     TONSILLECTOMY     TOTAL KNEE ARTHROPLASTY  02/20/2012   Procedure: TOTAL KNEE ARTHROPLASTY;  Surgeon: Bevin Bucks, MD;  Location: [facility];  Service: Orthopedics;  Laterality: Left;   TRANSVAGINAL TAPE  2000    Physical Exam   Triage Vital Signs: ED Triage Vitals  Encounter Vitals Group     BP 09/25/23 1222 (!) 155/85     Systolic BP Percentile --      Diastolic BP Percentile --      Pulse Rate 09/25/23 1222 90     Resp 09/25/23 1222 20     Temp 09/25/23 1222 98.1 F (36.7 C)     Temp Source 09/25/23 1222 Oral     SpO2 09/25/23 1222 93 %     Weight --  Height --      Head Circumference --      Peak Flow --      Pain Score 09/25/23 1223 8     Pain Loc --      Pain Education --      Exclude from Growth Chart --     Most recent vital signs: Vitals:   09/25/23 1222  BP: (!) 155/85  Pulse: 90  Resp: 20  Temp: 98.1 F (36.7 C)  SpO2: 93%    General: Awake, no distress.  CV:  Good peripheral perfusion.  Regular rate rhythm Resp:  Normal effort.  Clear to auscultation bilaterally Abd:  No distention.  Soft nontender Other:  Head atraumatic.  No midline spinal tenderness.  Intact range of motion in the C-spine.  Symmetric pedal pulses.   ED Results / Procedures / Treatments   Labs (all labs ordered are listed, but only abnormal results are displayed) Labs Reviewed - No data to display   EKG    RADIOLOGY X-ray left hip interpreted by me, left hip status post total hip prosthesis.   There is fracture of the remaining proximal left femur without the hardware.   PROCEDURES:  Procedures   MEDICATIONS ORDERED IN ED: Medications - No data to display   IMPRESSION / MDM / ASSESSMENT AND PLAN / ED COURSE  I reviewed the triage vital signs and the nursing notes.  DDx: Hip fracture, contusion, hip dislocation.  Doubt infection, anemia, electrolyte derangement, ACS, syncope  Patient's presentation is most consistent with acute presentation with potential threat to life or bodily function.  Patient presents with left hip pain after a mechanical fall.  Has tenderness of the left hip.  X-ray shows periprosthetic intertrochanteric fracture.  Discussed with Ortho Dr. Dave Erie who will evaluate, request CT of the left hip.  Will follow-up recommendations.       FINAL CLINICAL IMPRESSION(S) / ED DIAGNOSES   Final diagnoses:  Periprosthetic fracture around internal prosthetic left hip joint, initial encounter (HCC)     Rx / DC Orders   ED Discharge Orders     None        Note:  This document was prepared using Dragon voice recognition software and may include unintentional dictation errors.   Jacquie Maudlin, MD 09/25/23 [phone]

## 2023-09-25 NOTE — Assessment & Plan Note (Addendum)
 Suspect secondary to fentanyl  75 mcg IV one-time dose Continue oxygen supplementation to maintain SpO2 greater than 92% Continuous pulse oximetry

## 2023-09-25 NOTE — Hospital Course (Addendum)
 [name] is a 73 year old female with history of anxiety, fibromyalgia, hypertension, depression, hyperlipidemia, who presents ED for chief concerns of fall from standing position at [facility].  Vitals in the ED showed T of 98.1, rr 20, hr 90, blood pressure 155/85, SpO2 93% on room air.  Serum sodium,**  Labs not done in the ED.  I called lab.  Pending collection and processing of BMP and CBC.  **  ED treatment: None.  Per ED, patient was given fentanyl  75 mcg via EMS.

## 2023-09-25 NOTE — Assessment & Plan Note (Signed)
 Secondary to left nondisplaced intertrochanteric fracture about the femoral prosthesis Symptomatic support: Home tramadol  50 mg p.o. every 6 hours as needed for moderate pain, resumed; morphine 2 mg IV every 4 hours as needed for severe pain, 2 days ordered; morphine 4 mg IV every 4 hours as needed for severe pain not relieved with IV morphine 2 mg, 1 day ordered

## 2023-09-25 NOTE — H&P (Signed)
 History and Physical   [name] UUV:[id number] DOB: 1950-10-06 DOA: 09/25/2023  PCP: Pcp, No  Patient coming from: [facility] via [facility]  I have personally briefly reviewed patient's old medical records in [facility] EMR.  Chief Concern: fall, left hip pain  HPI: [name] is a 73 year old female with history of anxiety, fibromyalgia, hypertension, depression, hyperlipidemia, who presents ED for chief concerns of fall from standing position at [facility].  Vitals in the ED showed T of 98.1, rr 20, hr 90, blood pressure 155/85, SpO2 93% on room air.  Serum sodium,**  Labs not done in the ED.  I called lab.  Pending collection and processing of BMP and CBC.  **  ED treatment: None.  Per ED, patient was given fentanyl  75 mcg via EMS. --------------------------------- At bedside, patient able to tell me her first and last name, age, location, current calendar year.  She reports that she fell today while standing.  She is currently hurting on her left hip.  She denies nausea, vomiting, chest pain, shortness of breath, dysuria, hematuria, diarrhea.  Social history: She lives at [facility] [employer] [facility].  ROS: Constitutional: no weight change, no fever ENT/Mouth: no sore throat, no rhinorrhea Eyes: no eye pain, no vision changes Cardiovascular: no chest pain, no dyspnea,  no edema, no palpitations Respiratory: no cough, no sputum, no wheezing Gastrointestinal: no nausea, no vomiting, no diarrhea, no constipation Genitourinary: no urinary incontinence, no dysuria, no hematuria Musculoskeletal: no arthralgias, no myalgias, + left leg pain Skin: no skin lesions, no pruritus, Neuro: + weakness, no loss of consciousness, no syncope Psych: no anxiety, no depression, no decrease appetite Heme/Lymph: no bruising, no bleeding  ED Course: Discussed with EDP, patient requiring hospitalization for chief concerns of pain control and PT with SNF placement  Assessment/Plan  Principal  Problem:   Inadequate pain control Active Problems:   Benign essential tremor syndrome   Hyperlipidemia, unspecified   HTN (hypertension)   Chronic pain   Anxiety and depression   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 every 6 hours as needed for moderate pain, resumed; morphine 2 mg IV every 4 hours as needed for severe pain, 2 days ordered; morphine 4 mg IV every 4 hours as needed for severe pain not relieved with IV morphine 2 mg, 1 day ordered  Acute hypoxic respiratory failure (HCC) Suspect secondary to fentanyl  75 mcg IV one-time dose Continue oxygen supplementation to maintain SpO2 greater than 92% Continuous pulse oximetry  Closed left femoral fracture (HCC) Nondisplaced intertrochanteric fracture about the femoral prosthesis Present on admission secondary to falling at standing Fall precaution Symptomatic support per above Orthopedic surgeon has been consulted and recommends conservative management at this time.  Pending CT left hip and if negative for distal fracture, orthopedic would recommend PT, SNF placement, and weightbearing as tolerated  Anxiety and depression Home clonazepam  0.5 mg nightly, sertraline  150 mg daily resumed, bupropion  100 mg p.o. twice daily were resumed  Chronic pain PDMP reviewed Current active/most recent prescription: Clonazepam  0.5 mg, quantity 30, 30-day supply; tramadol  50 mg, 14, 5-day supply  HTN (hypertension) Hydralazine 5 mg IV every 6 hours as needed for SBP greater than 170, 5 days ordered  Hyperlipidemia, unspecified Pravastatin  80 mg nightly  resumed  Chart reviewed.   DVT prophylaxis: Enoxaparin  Code Status: Heart healthy Diet:  Heart healthy Family Communication: A phone call was offered, patient declined stating that her son knows she is being admitted to the hospital Disposition Plan: Pending clinical course Consults called: PT, TOC Admission status: Telemetry medical, observation  Past Medical History:  Diagnosis Date   Abnormal EKG    PREOP EKG -NSR, CANNOT RULE OUT INFERIOR INFARCT AGE -INDETERMIINATE--FOLLOWED UP BY PT'S MEDICAL DOCTOR WITH ECHOCARDIOGRAM AND PT CLEARED FOR LEFT TOTAL KNEE ARTHROPLASTY SURGERY.   Anxiety    Arthritis    Depression    Fibromyalgia    GERD (gastroesophageal reflux disease)    Glaucoma    Hyperlipidemia    Hypertension    IBS (irritable bowel syndrome)    Incontinence of urine    PONV (postoperative nausea and vomiting)    Shortness of breath    WITH EXERTION   Umbilical hernia    NO PAIN   Past Surgical History:  Procedure Laterality Date   ABDOMINAL HYSTERECTOMY  1990   APPENDECTOMY  1955   CHOLECYSTECTOMY  2007   EYE SURGERY     LASER EYE SURGERY -LEFT FOR TX GLAUCOMA   JOINT REPLACEMENT     LEFT TOTAL HIP REPLACEMENT 1998,  RIGHT TOTAL KNEE REPLACEMENT OCT 2012   LAPAROSCOPY TO REMOVE SCAR TISSUE  1991   LEFT KNEE ARTHROSCOPY 2012     RT SHOULDER ARTHROSCOPY  2011     TONSILLECTOMY     TOTAL KNEE ARTHROPLASTY  02/20/2012   Procedure: TOTAL KNEE ARTHROPLASTY;  Surgeon: Bevin Bucks, MD;  Location: [facility]S;  Service: Orthopedics;  Laterality: Left;   TRANSVAGINAL TAPE  2000   Social History:  reports that she has quit smoking. Her smoking use included cigarettes. She has never used smokeless tobacco. She reports that she does not drink alcohol and does not use drugs.  Allergies  Allergen Reactions   Erythromycin Other (See Comments)    Upset stomach   Methocarbamol Nausea Only   Prednisone    Sulfa Antibiotics Other (See Comments)    Turns eyes red   Family History  Problem Relation Age of Onset   Bone cancer Mother    Arthritis Mother     Cancer Mother    Parkinsonism Mother    Arthritis Father    Stroke Father    Diabetes Father    Breast cancer Daughter    Stroke Sister    Parkinsonism Brother    Diabetes Brother    Family history: Family history reviewed and not pertinent.  Prior to Admission medications   Medication Sig Start Date End Date Taking? Authorizing Provider  acetaminophen  (TYLENOL ) 325 MG tablet Take 325 mg by mouth every 4 (four) hours as needed. 12/26/22   [provider]  Baclofen 5 MG TABS Take 1 tablet by mouth every 8 (eight) hours as needed (back pain). 03/06/23   [provider]  bisacodyl  (BISACODYL  LAXATIVE) 10 MG suppository Place 1 suppository (10 mg total) rectally daily as needed for moderate constipation or severe constipation. 08/15/23   Alexander, Natalie, DO  buPROPion  ER (WELLBUTRIN  SR) 100 MG 12 hr tablet Take 100 mg by mouth 2 (two) times daily. 07/21/23   [provider]  cetirizine (ZYRTEC) 10 MG tablet Take 10 mg by mouth daily as needed for allergies. 06/16/23   [provider]  Cholecalciferol (VITAMIN D) 50 MCG (2000 UT) tablet Take 2,000 Units by mouth daily.  [provider]  clonazePAM  (KLONOPIN ) 0.5 MG tablet Take 1 tablet (0.5 mg total) by mouth at bedtime. 08/15/23   Alexander, Natalie, DO  cyanocobalamin (VITAMIN B12) 1000 MCG tablet Take 1,000 mcg by mouth daily.    [provider]  docusate sodium  (COLACE) 100 MG capsule Take 100 mg by mouth daily.    [provider]  estradiol (VIVELLE-DOT) 0.1 MG/24HR patch Place 1 patch onto the skin 2 (two) times a week. 10/13/19   [provider]  folic acid (FOLVITE) 400 MCG tablet Take 400 mcg by mouth daily.    [provider]  LIDOCAINE  PAIN RELIEF 4 % Place 1 patch onto the skin daily. Apply 1 patch topically to lower back once a day on for 12 hours and off for 12 hours. 12/30/22   [provider]  Omega-3 Fatty Acids (FISH OIL) 300 MG CAPS Take 1  capsule by mouth daily.    [provider]  omeprazole (PRILOSEC) 20 MG capsule Take 20 mg by mouth daily. 10/12/19   [provider]  ondansetron  (ZOFRAN -ODT) 4 MG disintegrating tablet Take 1 tablet (4 mg total) by mouth every 8 (eight) hours as needed for nausea or vomiting. 10/10/22   Viviano Ground, MD  polyethylene glycol (MIRALAX ) 17 g packet Take 17 g by mouth daily. 08/15/23   Alexander, Natalie, DO  pravastatin  (PRAVACHOL ) 80 MG tablet Take 80 mg by mouth at bedtime.    [provider]  sertraline  (ZOLOFT ) 100 MG tablet Take 150 mg by mouth daily.    [provider]  traMADol  (ULTRAM ) 50 MG tablet Take 1 tablet (50 mg total) by mouth every 8 (eight) hours as needed for moderate pain (pain score 4-6) or severe pain (pain score 7-10). 08/15/23   Alexander, Natalie, DO  Trospium  Chloride 60 MG CP24 Take 1 capsule (60 mg total) by mouth daily. 03/18/23   Kathreen Pare, PA-C   Physical Exam: Vitals:   09/25/23 1530 09/25/23 1800 09/25/23 1814 09/25/23 1845  BP: (!) 171/84 (!) 168/89  (!) 151/90  Pulse: 85 91  91  Resp:    15  Temp:    98.1 F (36.7 C)  TempSrc:    Oral  SpO2: 94% 95%  94%  Weight:   72.6 kg    Constitutional: appears age-appropriate, frail, mildly anxious Eyes: PERRL, lids and conjunctivae normal ENMT: Mucous membranes are moist. Posterior pharynx clear of any exudate or lesions. Age-appropriate dentition. Hearing appropriate Neck: normal, supple, no masses, no thyromegaly Respiratory: clear to auscultation bilaterally, no wheezing, no crackles. Normal respiratory effort. No accessory muscle use.  Cardiovascular: Regular rate and rhythm, no murmurs / rubs / gallops. No extremity edema. 2+ pedal pulses. No carotid bruits.  Abdomen: Obese abdomen, no tenderness, no masses palpated, no hepatosplenomegaly. Bowel sounds positive.  Musculoskeletal: no clubbing / cyanosis. No joint deformity upper and lower extremities. Good ROM, no  contractures, no atrophy. Normal muscle tone.  Skin: no rashes, lesions, ulcers. No induration Neurologic: Sensation intact. Strength 5/5 in all 4.  Psychiatric: Normal judgment and insight. Alert and oriented x 3. Normal mood.   EKG: independently reviewed, showing Not indicated at this time  x-ray on Admission: I personally reviewed and I agree with radiologist reading as below.  CT Hip Left Wo Contrast Result Date: 09/25/2023 CLINICAL DATA:  Hip trauma. EXAM: CT OF THE LEFT HIP WITHOUT CONTRAST TECHNIQUE: Multidetector CT imaging of the left hip was performed according to the standard protocol. Multiplanar CT image  reconstructions were also generated. RADIATION DOSE REDUCTION: This exam was performed according to the departmental dose-optimization program which includes automated exposure control, adjustment of the mA and/or kV according to patient size and/or use of iterative reconstruction technique. COMPARISON:  Left hip radiographs dated Sep 25, 2023 at 1:11 p.m. CT pelvis dated 08/09/2023. FINDINGS: Bones/Joint/Cartilage Status post left total hip arthroplasty. Displaced periprosthetic intertrochanteric fracture of the left proximal femur with fracture margins extending to the medial, lateral, and posterolateral surface of the proximal femoral stem component. There is approximately 3.6 cm of anterior displacement along the anterior fracture margin. Hardware is otherwise intact without dislocation. Acetabular component appears well seated. Pubic symphysis is anatomically aligned with degenerative changes. Ligaments Ligaments are suboptimally evaluated by CT. Soft tissue and Muscles Intramuscular edema with probable hematoma of the left adductor and left anterior compartment musculature of the thigh, predominantly involving the quadriceps musculature. Soft tissue swelling along the left lateral hip. No radiopaque foreign body. No loculated fluid collection identified. IMPRESSION: 1. Displaced  periprosthetic intertrochanteric fracture of the left proximal femur with fracture margins extending to the surface of the proximal femoral stem component, as above. 2. Intramuscular edema with probable hematoma of the left adductor and left anterior compartment musculature of the thigh, predominantly involving the quadriceps musculature. Soft tissue swelling along the left lateral hip. Electronically Signed   By: Mannie Seek M.D.   On: 09/25/2023 18:06   CT Head Wo Contrast Result Date: 09/25/2023 CLINICAL DATA:  Fall.  Blunt head trauma. EXAM: CT HEAD WITHOUT CONTRAST TECHNIQUE: Contiguous axial images were obtained from the base of the skull through the vertex without intravenous contrast. RADIATION DOSE REDUCTION: This exam was performed according to the departmental dose-optimization program which includes automated exposure control, adjustment of the mA and/or kV according to patient size and/or use of iterative reconstruction technique. COMPARISON:  08/09/2023 FINDINGS: Brain: No evidence of intracranial hemorrhage, acute infarction, hydrocephalus, extra-axial collection, or mass lesion/mass effect. Mild diffuse cerebral atrophy and moderate chronic small vessel disease show no significant change. Vascular:  No hyperdense vessel or other acute findings. Skull: No evidence of fracture or other significant bone abnormality. Sinuses/Orbits:  No acute findings. Other: Moderate left posterior parietal scalp hematoma is seen. IMPRESSION: Moderate left posterior parietal scalp hematoma. No evidence of skull fracture or acute intracranial abnormality. Stable cerebral atrophy and chronic small vessel disease. Electronically Signed   By: Marlyce Sine M.D.   On: 09/25/2023 17:53   DG Hip Unilat W or Wo Pelvis 2-3 Views Left Result Date: 09/25/2023 EXAM: 1 VIEW XRAY OF THE PELVIS 2 VIEWS XRAY OF THE LEFT FEMUR 09/25/2023 01:11:00 PM COMPARISON: 08/09/2023 CLINICAL HISTORY: FINDINGS: BONES AND JOINTS: A  nondisplaced intertrochanteric fracture is present about the femoral prosthesis. The prosthesis is intact. The hip is located. No additional fractures are present. SOFT TISSUES: The soft tissues are unremarkable. IMPRESSION: 1. Nondisplaced intertrochanteric fracture about the femoral prosthesis. 2. Intact prosthesis and located hip. 3. No additional fractures. Electronically signed by: Audree Leas MD 09/25/2023 02:57 PM EDT RP Workstation: [id number]   Labs on Admission: I have personally reviewed following labs  CBC: No results for input(s): "WBC", "NEUTROABS", "HGB", "HCT", "MCV", "PLT" in the last 168 hours. Basic Metabolic Panel: No results for input(s): "NA", "K", "CL", "CO2", "GLUCOSE", "BUN", "CREATININE", "CALCIUM", "MG", "PHOS" in the last 168 hours. GFR: CrCl cannot be calculated (Patient's most recent lab result is older than the maximum 21 days allowed.).  Liver Function Tests: No results for input(s): "AST", "  ALT", "ALKPHOS", "BILITOT", "PROT", "ALBUMIN" in the last 168 hours. No results for input(s): "LIPASE", "AMYLASE" in the last 168 hours. No results for input(s): "AMMONIA" in the last 168 hours. Coagulation Profile: No results for input(s): "INR", "PROTIME" in the last 168 hours. Cardiac Enzymes: No results for input(s): "CKTOTAL", "CKMB", "CKMBINDEX", "TROPONINI" in the last 168 hours. BNP (last 3 results) No results for input(s): "PROBNP" in the last 8760 hours. HbA1C: No results for input(s): "HGBA1C" in the last 72 hours. CBG: No results for input(s): "GLUCAP" in the last 168 hours. Lipid Profile: No results for input(s): "CHOL", "HDL", "LDLCALC", "TRIG", "CHOLHDL", "LDLDIRECT" in the last 72 hours. Thyroid Function Tests: No results for input(s): "TSH", "T4TOTAL", "FREET4", "T3FREE", "THYROIDAB" in the last 72 hours. Anemia Panel: No results for input(s): "VITAMINB12", "FOLATE", "FERRITIN", "TIBC", "IRON", "RETICCTPCT" in the last 72 hours. Urine  analysis:    Component Value Date/Time   COLORURINE YELLOW (A) 08/09/2023 1510   APPEARANCEUR HAZY (A) 08/09/2023 1510   APPEARANCEUR Clear 03/18/2023 1423   LABSPEC 1.009 08/09/2023 1510   PHURINE 6.0 08/09/2023 1510   GLUCOSEU NEGATIVE 08/09/2023 1510   HGBUR NEGATIVE 08/09/2023 1510   BILIRUBINUR NEGATIVE 08/09/2023 1510   BILIRUBINUR Negative 03/18/2023 1423   KETONESUR NEGATIVE 08/09/2023 1510   PROTEINUR NEGATIVE 08/09/2023 1510   UROBILINOGEN 0.2 10/14/2019 1011   UROBILINOGEN 0.2 07/24/2013 1451   NITRITE NEGATIVE 08/09/2023 1510   LEUKOCYTESUR LARGE (A) 08/09/2023 1510   This document was prepared using Dragon Voice Recognition software and may include unintentional dictation errors.  Dr. Reinhold Carbine [facility]  If 7PM-7AM, please contact overnight-coverage provider If 7AM-7PM, please contact day attending provider [email or web address]  09/25/2023, 6:58 PM

## 2023-09-25 NOTE — Assessment & Plan Note (Signed)
 Nondisplaced intertrochanteric fracture about the femoral prosthesis Present on admission secondary to falling at standing Fall precaution Symptomatic support per above Orthopedic surgeon has been consulted and recommends conservative management at this time.  Pending CT left hip and if negative for distal fracture, orthopedic would recommend PT, SNF placement, and weightbearing as tolerated

## 2023-09-25 NOTE — Assessment & Plan Note (Signed)
Continue statin 

## 2023-09-25 NOTE — Assessment & Plan Note (Signed)
 Home clonazepam  0.5 mg nightly, sertraline  150 mg daily resumed, bupropion  100 mg p.o. twice daily were resumed

## 2023-09-25 NOTE — ED Triage Notes (Signed)
 First nurse note: Pt to Ed via [facility] from [facility]. Pt fell from standing position. No LOC. No thinner. Pt reports left hip pain. No rotation or deformity noted by Ems. Pain 7/10  fentanyl  4mg  Zofran   144/89 cbg 101 97.8 oral  RR 22 20g LAC

## 2023-09-26 DIAGNOSIS — E876 Hypokalemia: Secondary | ICD-10-CM | POA: Diagnosis present

## 2023-09-26 DIAGNOSIS — R52 Pain, unspecified: Secondary | ICD-10-CM | POA: Diagnosis present

## 2023-09-26 DIAGNOSIS — Z96651 Presence of right artificial knee joint: Secondary | ICD-10-CM | POA: Diagnosis present

## 2023-09-26 DIAGNOSIS — Z90710 Acquired absence of both cervix and uterus: Secondary | ICD-10-CM | POA: Diagnosis not present

## 2023-09-26 DIAGNOSIS — Z881 Allergy status to other antibiotic agents status: Secondary | ICD-10-CM | POA: Diagnosis not present

## 2023-09-26 DIAGNOSIS — S72145A Nondisplaced intertrochanteric fracture of left femur, initial encounter for closed fracture: Secondary | ICD-10-CM | POA: Diagnosis present

## 2023-09-26 DIAGNOSIS — I10 Essential (primary) hypertension: Secondary | ICD-10-CM | POA: Diagnosis present

## 2023-09-26 DIAGNOSIS — E785 Hyperlipidemia, unspecified: Secondary | ICD-10-CM | POA: Diagnosis present

## 2023-09-26 DIAGNOSIS — Z96642 Presence of left artificial hip joint: Secondary | ICD-10-CM | POA: Diagnosis present

## 2023-09-26 DIAGNOSIS — F419 Anxiety disorder, unspecified: Secondary | ICD-10-CM | POA: Diagnosis present

## 2023-09-26 DIAGNOSIS — G250 Essential tremor: Secondary | ICD-10-CM | POA: Diagnosis present

## 2023-09-26 DIAGNOSIS — J9601 Acute respiratory failure with hypoxia: Secondary | ICD-10-CM | POA: Diagnosis present

## 2023-09-26 DIAGNOSIS — K219 Gastro-esophageal reflux disease without esophagitis: Secondary | ICD-10-CM | POA: Diagnosis present

## 2023-09-26 DIAGNOSIS — T40415A Adverse effect of fentanyl or fentanyl analogs, initial encounter: Secondary | ICD-10-CM | POA: Diagnosis present

## 2023-09-26 DIAGNOSIS — M797 Fibromyalgia: Secondary | ICD-10-CM | POA: Diagnosis present

## 2023-09-26 DIAGNOSIS — F32A Depression, unspecified: Secondary | ICD-10-CM | POA: Diagnosis present

## 2023-09-26 DIAGNOSIS — W19XXXA Unspecified fall, initial encounter: Secondary | ICD-10-CM | POA: Diagnosis present

## 2023-09-26 DIAGNOSIS — Z79899 Other long term (current) drug therapy: Secondary | ICD-10-CM | POA: Diagnosis not present

## 2023-09-26 DIAGNOSIS — M9702XA Periprosthetic fracture around internal prosthetic left hip joint, initial encounter: Secondary | ICD-10-CM | POA: Diagnosis present

## 2023-09-26 LAB — CBC
HCT: 28.3 % — ABNORMAL LOW (ref 36.0–46.0)
Hemoglobin: 9.5 g/dL — ABNORMAL LOW (ref 12.0–15.0)
MCH: 30.3 pg (ref 26.0–34.0)
MCHC: 33.6 g/dL (ref 30.0–36.0)
MCV: 90.1 fL (ref 80.0–100.0)
Platelets: 205 10*3/uL (ref 150–400)
RBC: 3.14 MIL/uL — ABNORMAL LOW (ref 3.87–5.11)
RDW: 13.8 % (ref 11.5–15.5)
WBC: 7.2 10*3/uL (ref 4.0–10.5)
nRBC: 0 % (ref 0.0–0.2)

## 2023-09-26 LAB — BASIC METABOLIC PANEL WITH GFR
Anion gap: 10 (ref 5–15)
BUN: 15 mg/dL (ref 8–23)
CO2: 25 mmol/L (ref 22–32)
Calcium: 8.6 mg/dL — ABNORMAL LOW (ref 8.9–10.3)
Chloride: 105 mmol/L (ref 98–111)
Creatinine, Ser: 0.85 mg/dL (ref 0.44–1.00)
GFR, Estimated: >60 mL/min (ref >60)
Glucose, Bld: 111 mg/dL — ABNORMAL HIGH (ref 70–99)
Potassium: 2.9 mmol/L — ABNORMAL LOW (ref 3.5–5.1)
Sodium: 140 mmol/L (ref 135–145)

## 2023-09-26 LAB — MAGNESIUM: Magnesium: 2 mg/dL (ref 1.7–2.4)

## 2023-09-26 MED ORDER — MORPHINE SULFATE (PF) 2 MG/ML IV SOLN
2.0000 mg | INTRAVENOUS | Status: AC | PRN
  Filled 2023-09-26: qty 1

## 2023-09-26 MED ORDER — POTASSIUM CHLORIDE CRYS ER 20 MEQ PO TBCR
40.0000 meq | EXTENDED_RELEASE_TABLET | ORAL | Status: AC
  Administered 2023-09-26 (×2): 40 meq via ORAL
  Filled 2023-09-26 (×2): qty 2

## 2023-09-26 MED ORDER — ACETAMINOPHEN 500 MG PO TABS
1000.0000 mg | ORAL_TABLET | Freq: Three times a day (TID) | ORAL | Status: DC
  Administered 2023-09-26 – 2023-09-29 (×10): 1000 mg via ORAL
  Filled 2023-09-26 (×10): qty 2

## 2023-09-26 NOTE — Care Management Obs Status (Signed)
 MEDICARE OBSERVATION STATUS NOTIFICATION   Patient Details  Name: [name] MRN: [id number] Date of Birth: 04-18-1951   Medicare Observation Status Notification Given:  Rudolph Cost, CMA 09/26/2023, 1:39 PM

## 2023-09-26 NOTE — Evaluation (Signed)
 Physical Therapy Evaluation Patient Details Name: [name] MRN: [id number] DOB: Nov 26, 1950 Today's Date: 09/26/2023  History of Present Illness  Pt is a 73 year old female who presents ED for chief concerns of fall from standing position at [facility]. 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 Pt currently with functional limitations due to the deficits listed below (see PT Problem List). Pt received upright in bed agreeable to PT/OT co-eval for pt/therapist safety and WB status. PT is A&Ox3, accurate reports of PLOF per EMR from prior admission. Reports living at ILF being mod-I with RW and indep with ADL's. Staff provides meals.   To pt, PT educating on WB status and hip abduction precautions. PT demo on safe STS and standing at RW while maintaining 50% PWB. modA+2 for bed mobility and dependent for LLE mobility due to hip abduction precautions. Fair sitting tolerance. Has very poor carryover and does not remember education just provided 2-3 min ago on being PWB and hip abduction precautions. Pt requiring modA+2 to stand with very heavy BUE support needed on RW and blocking feet due to poor anterior weight shift. Pt unable to maintain PWB on LLE and unable to attempt step pivot transfer or lateral steps towards HOB. Requires seated rest after ~20-30 seconds. OT assisting in seated ADL's then maxA+2 to return to supine and placed upright in bed. Pt with all needs in reach. Pt with history of recent falls, poor understanding of WB status and unable to maintain. Due to these deficits pt would benefit from skilled PT services prior to return to ILF.       If plan is discharge home, recommend the following: Two people to help with walking and/or transfers;Two people to help with bathing/dressing/bathroom;Assistance with  cooking/housework;Supervision due to cognitive status;Help with stairs or ramp for entrance   Can travel by private vehicle   No    Equipment Recommendations Other (comment) (TBD)  Recommendations for Other Services       Functional Status Assessment Patient has had a recent decline in their functional status and demonstrates the ability to make significant improvements in function in a reasonable and predictable amount of time.     Precautions / Restrictions Precautions Precautions: Fall Recall of Precautions/Restrictions: Impaired Restrictions Weight Bearing Restrictions Per Provider Order: Yes LLE Weight Bearing Per Provider Order: Partial weight bearing LLE Partial Weight Bearing Percentage or Pounds: 50% Other Position/Activity Restrictions: No abduction      Mobility  Bed Mobility Overal bed mobility: Needs Assistance Bed Mobility: Supine to Sit, Rolling Rolling: Mod assist, Used rails   Supine to sit: +2 for physical assistance, HOB elevated, Mod assist     General bed mobility comments: MOD A for LE management to maintain abduction precautions Patient Response: Cooperative, Anxious  Transfers Overall transfer level: Needs assistance Equipment used: Rolling walker (2 wheels) Transfers: Sit to/from Stand Sit to Stand: Mod assist, +2 physical assistance, From elevated surface           General transfer comment: Unable to maintain WB precautions. In ability to take steps due to pain in L hip and maintain WB status to progress to step pivot to recliner or even laterally step at EOB.    Ambulation/Gait               General Gait Details: deferred due to inability to maintain 50% PWB  Stairs  Wheelchair Mobility     Tilt Bed Tilt Bed Patient Response: Cooperative, Anxious  Modified Rankin (Stroke Patients Only)       Balance Overall balance assessment: Needs assistance Sitting-balance support: Single extremity supported, Feet  supported Sitting balance-Leahy Scale: Fair     Standing balance support: Bilateral upper extremity supported, Reliant on assistive device for balance Standing balance-Leahy Scale: Poor Standing balance comment: Heavy BUE support, 2 person assist.                             Pertinent Vitals/Pain Pain Assessment Pain Assessment: 0-10 Pain Score: 5  Pain Location: L hip Pain Descriptors / Indicators: Aching, Discomfort, Grimacing Pain Intervention(s): Limited activity within patient's tolerance, Monitored during session, Repositioned    Home Living Family/patient expects to be discharged to::  (ILF)                 Home Equipment: Rolling Walker (2 wheels);BSC/3in1 Additional Comments: Pt reports living at [facility]    Prior Function Prior Level of Function : History of Falls (last six months);Independent/Modified Independent;Patient poor historian/Family not available             Mobility Comments: mod-I with RW ADLs Comments: Pt reports she is IND in ADLs, eats meals in the dining hall     Extremity/Trunk Assessment   Upper Extremity Assessment Upper Extremity Assessment: Generalized weakness    Lower Extremity Assessment Lower Extremity Assessment: Generalized weakness;LLE deficits/detail;RLE deficits/detail RLE Deficits / Details: edema LLE Deficits / Details: limited knee extension against gravity due to pain. Edema       Communication   Communication Communication: No apparent difficulties    Cognition Arousal: Alert Behavior During Therapy: WFL for tasks assessed/performed, Anxious   PT - Cognitive impairments: History of cognitive impairments                       PT - Cognition Comments: A&Ox3. Not oriented to time Following commands: Impaired Following commands impaired: Follows one step commands inconsistently, Follows one step commands with increased time     Cueing Cueing Techniques: Verbal cues, Tactile cues,  Gestural cues     General Comments      Exercises General Exercises - Lower Extremity Long Arc Quad: AROM, Strengthening, Left, 10 reps Other Exercises Other Exercises: Role of PT in acute setting, WB status, hip abduction precautions. PT demo on safe transfers using RW and standing while maintaining precautions.   Assessment/Plan    PT Assessment Patient needs continued PT services  PT Problem List Decreased strength;Decreased mobility;Decreased range of motion;Decreased activity tolerance;Decreased balance;Pain;Decreased cognition       PT Treatment Interventions DME instruction;Therapeutic exercise;Gait training;Balance training;Neuromuscular re-education;Functional mobility training;Cognitive remediation;Therapeutic activities;Patient/family education    PT Goals (Current goals can be found in the Care Plan section)  Acute Rehab PT Goals Patient Stated Goal: to return to her ILF PT Goal Formulation: With patient Time For Goal Achievement: 10/10/23 Potential to Achieve Goals: Fair    Frequency 7X/week     Co-evaluation   Reason for Co-Treatment: To address functional/ADL transfers;For patient/therapist safety   OT goals addressed during session: ADL's and self-care       AM-PAC PT "6 Clicks" Mobility  Outcome Measure Help needed turning from your back to your side while in a flat bed without using bedrails?: A Lot Help needed moving from lying on your back to sitting on the side of a  flat bed without using bedrails?: Total Help needed moving to and from a bed to a chair (including a wheelchair)?: Total Help needed standing up from a chair using your arms (e.g., wheelchair or bedside chair)?: A Lot Help needed to walk in hospital room?: Total Help needed climbing 3-5 steps with a railing? : Total 6 Click Score: 8    End of Session Equipment Utilized During Treatment: Gait belt Activity Tolerance: Patient limited by pain Patient left: in bed;with call bell/phone  within reach;with bed alarm set Nurse Communication: Mobility status PT Visit Diagnosis: Muscle weakness (generalized) (M62.81);Difficulty in walking, not elsewhere classified (R26.2);Other abnormalities of gait and mobility (R26.89);History of falling (Z91.81);Pain Pain - Right/Left: Left Pain - part of body: Hip    Time: 9147-8295 PT Time Calculation (min) (ACUTE ONLY): 31 min   Charges:   PT Evaluation $PT Eval Low Complexity: 1 Low PT Treatments $Therapeutic Activity: 8-22 mins PT General Charges $$ ACUTE PT VISIT: 1 Visit        Marc Senior. Fairly IV, PT, DPT Physical Therapist- [place]  [facility] 09/26/2023, 12:55 PM

## 2023-09-26 NOTE — Progress Notes (Signed)
 Occupational Therapy Evaluation Patient Details Name: [name] MRN: [id number] DOB: 1950-08-15 Today's Date: 09/26/2023   History of Present Illness   Pt is a 73 year old female who presents ED for chief concerns of fall from standing position at [facility]. 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 Prior to hospital admission, pt reported IND in ADLs. Pt lives at [facility]. Pt presents to acute OT demonstrating impaired ADL performance and functional mobility 2/2 generalized weakness, decreased activity tolerance, and pain (See OT problem list for additional functional deficits). Pt currently requires MAX A for don/doffing socks, SETUP and SBA for grooming seated EOB, and MOD A for LE management to maintain abduction pcns for bed mobility . Pt would benefit from skilled OT services to address noted impairments and functional limitations (see below for any additional details) in order to maximize safety and independence while minimizing falls risk and caregiver burden. Anticipate the need for follow up OT services upon acute hospital DC.      If plan is discharge home, recommend the following:   A lot of help with walking and/or transfers;A lot of help with bathing/dressing/bathroom;Assist for transportation     Functional Status Assessment   Patient has had a recent decline in their functional status and demonstrates the ability to make significant improvements in function in a reasonable and predictable amount of time.     Equipment Recommendations   Other (comment) (Defer to next venue)     Recommendations for Other Services         Precautions/Restrictions   Precautions Precautions: Other (comment);Fall (Hip Abduction) Recall of Precautions/Restrictions:  Impaired Restrictions Weight Bearing Restrictions Per Provider Order: Yes LLE Weight Bearing Per Provider Order: Partial weight bearing Other Position/Activity Restrictions: No abduction     Mobility Bed Mobility Overal bed mobility: Needs Assistance Bed Mobility: Supine to Sit, Rolling Rolling: Mod assist   Supine to sit: +2 for physical assistance, HOB elevated, Mod assist     General bed mobility comments: MOD A for LE management to maintain abduction pcns    Transfers Overall transfer level: Needs assistance Equipment used: Rolling walker (2 wheels) Transfers: Sit to/from Stand Sit to Stand: Mod assist, +2 physical assistance, From elevated surface                  Balance Overall balance assessment: Needs assistance Sitting-balance support: Single extremity supported, Feet supported Sitting balance-Leahy Scale: Fair     Standing balance support: Bilateral upper extremity supported, Reliant on assistive device for balance Standing balance-Leahy Scale: Poor                             ADL either performed or assessed with clinical judgement   ADL Overall ADL's : Needs assistance/impaired                                       General ADL Comments: MAX A for don/doffing socks; SETUP for grooming seated EOB     Vision         Perception         Praxis         Pertinent Vitals/Pain Pain Assessment Pain Assessment: 0-10 Pain Score: 5  Pain Location:  L hip Pain Descriptors / Indicators: Aching, Discomfort, Grimacing Pain Intervention(s): Limited activity within patient's tolerance, Monitored during session, Repositioned     Extremity/Trunk Assessment Upper Extremity Assessment Upper Extremity Assessment: Generalized weakness   Lower Extremity Assessment Lower Extremity Assessment: Generalized weakness;LLE deficits/detail;RLE deficits/detail RLE Deficits / Details: edema LLE Deficits / Details: limited knee  extension against gravity due to pain. Edema       Communication Communication Communication: No apparent difficulties   Cognition Arousal: Alert Behavior During Therapy: WFL for tasks assessed/performed, Anxious Cognition: No family/caregiver present to determine baseline             OT - Cognition Comments: AO x 2 (self and situation)                 Following commands: Impaired Following commands impaired: Follows one step commands inconsistently, Follows one step commands with increased time     Cueing  General Comments   Cueing Techniques: Verbal cues;Tactile cues;Gestural cues      Exercises     Shoulder Instructions      Home Living Family/patient expects to be discharged to::  (ILF)                             Home Equipment: Rolling Walker (2 wheels);BSC/3in1   Additional Comments: Pt reports living at [facility]      Prior Functioning/Environment Prior Level of Function : History of Falls (last six months);Independent/Modified Independent;Patient poor historian/Family not available             Mobility Comments: mod-I with RW ADLs Comments: Pt reports she is IND in ADLs, eats meals in the dining hall    OT Problem List: Decreased strength;Decreased range of motion;Decreased activity tolerance;Impaired balance (sitting and/or standing);Pain   OT Treatment/Interventions: Self-care/ADL training      OT Goals(Current goals can be found in the care plan section)   Acute Rehab OT Goals Patient Stated Goal: to go home OT Goal Formulation: With patient Time For Goal Achievement: 10/10/23 Potential to Achieve Goals: Good ADL Goals Pt Will Perform Grooming: with modified independence;standing Pt Will Perform Lower Body Dressing: with min assist;sit to/from stand Pt Will Transfer to Toilet: with min assist;stand pivot transfer;bedside commode   OT Frequency:  Min 2X/week    Co-evaluation PT/OT/SLP Co-Evaluation/Treatment:  Yes Reason for Co-Treatment: To address functional/ADL transfers;For patient/therapist safety   OT goals addressed during session: ADL's and self-care      AM-PAC OT "6 Clicks" Daily Activity     Outcome Measure Help from another person eating meals?: None Help from another person taking care of personal grooming?: A Little Help from another person toileting, which includes using toliet, bedpan, or urinal?: A Lot Help from another person bathing (including washing, rinsing, drying)?: A Lot Help from another person to put on and taking off regular upper body clothing?: A Little Help from another person to put on and taking off regular lower body clothing?: A Lot 6 Click Score: 16   End of Session Equipment Utilized During Treatment: Gait belt;Rolling walker (2 wheels)  Activity Tolerance: Patient tolerated treatment well;Patient limited by pain Patient left: in bed;with call bell/phone within reach;with bed alarm set  OT Visit Diagnosis: Unsteadiness on feet (R26.81);Other abnormalities of gait and mobility (R26.89);Muscle weakness (generalized) (M62.81);History of falling (Z91.81)                Time: 1610-9604 OT Time Calculation (min): 30 min  Charges:  OT General Charges $OT Visit: 1 Visit OT Evaluation $OT Eval Low Complexity: 1 Low OT Treatments $Self Care/Home Management : 8-22 mins  Stevenson Elbe, Student OT   [employer] 09/26/2023, 12:56 PM

## 2023-09-26 NOTE — Plan of Care (Signed)

## 2023-09-26 NOTE — Progress Notes (Signed)
  Progress Note   Patient: [name] OZH:[id number] DOB: 11/15/1950 DOA: 09/25/2023     0 DOS: the patient was seen and examined on 09/26/2023   Brief hospital course:  "Ms. Rosaline Ezekiel is a 73 year old female with history of anxiety, fibromyalgia, hypertension, depression, hyperlipidemia, who presents ED for chief concerns of fall from standing position at [facility]. "  Patient was found to have a left nondisplaced intertrochanteric fracture about the femoral prosthesis.  Patient was admitted for pain control and consultation with Orthopedic Surgery.  Further hospital course and management as outlined below.    Assessment and Plan:  * Inadequate pain control Closed left femoral fracture  Secondary to mechanical fall causing left nondisplaced intertrochanteric fracture about the femoral prosthesis. --Pain control per orders -- scheduled Tylenol  was added --Ortho consulted -- they discussed conservative versus surgical management. Patient and family elected for conservative path at this time. --Fall precautions --Bowel regimen --Follow up with Ortho in 2 weeks for repeat x-rays --50% partial weight bearing on LLE with rolling walker --Left hip Abduction precautions --PT/OT evaluations, anticipate need for SNF/rehab   Acute hypoxic respiratory failure (HCC) Suspect secondary to fentanyl  75 mcg IV one-time dose --O2 supplementation to maintain SpO2 >90% --Continuous pulse oximetry  Hypokalemia - K 2.9 --40 mEq Kcl x 2 doses to replace --Repeat BMP in AM --Check Mg level  Anxiety and depression --Continue home clonazepam , sertraline , bupropion     Chronic pain PDMP reviewed Current active/most recent prescription: Clonazepam  0.5 mg, quantity 30, 30-day supply; tramadol  50 mg, 14, 5-day supply   HTN (hypertension) --Hydralazine  5 mg IV PRN for now   Hyperlipidemia, unspecified --Pravastatin        Subjective: Pt awake resting in bed when seen this AM. She  reports feeling okay, pain control has been improved since admission.     Physical Exam: Vitals:   09/25/23 1936 09/26/23 0230 09/26/23 0739 09/26/23 1429  BP: (!) 145/69 (!) 154/83 (!) 140/66 (!) 122/58  Pulse: 90 84 84 94  Resp: 20 20 19 16   Temp: 97.6 F (36.4 C) (!) 97.5 F (36.4 C) 98.2 F (36.8 C) 98.2 F (36.8 C)  TempSrc: Oral Oral    SpO2: 93% 94% 94% 95%  Weight:       General exam: awake, alert, no acute distress HEENT: atraumatic, clear conjunctiva, anicteric sclera, moist mucus membranes, hearing grossly normal  Respiratory system: CTAB, no wheezes, rales or rhonchi, normal respiratory effort. Cardiovascular system: normal S1/S2, RRR, no pedal edema.   Gastrointestinal system: soft, NT, ND, no HSM felt, +bowel sounds. Central nervous system: A&O x3. no gross focal neurologic deficits, normal speech Extremities: no edema, normal tone Skin: dry, intact, normal temperature Psychiatry: normal mood, congruent affect, judgement and insight appear normal   Data Reviewed:  Notable lab s-- K 2.9, glucose 111, Hbg 9.5  Family Communication: None present. Pt declined for me to call anyone at this time.  Disposition: Status is: Inpatient Remains inpatient appropriate because: uncontrolled pain, needs SNF placement, electrolyte derangements that require close monitoring and replacement.    Planned Discharge Destination: Skilled nursing facility    Time spent: 45 minutes  Author: Montey Apa, DO 09/26/2023 4:41 PM  For on call review [email or web address].

## 2023-09-27 DIAGNOSIS — R52 Pain, unspecified: Secondary | ICD-10-CM | POA: Diagnosis not present

## 2023-09-27 LAB — BASIC METABOLIC PANEL WITH GFR
Anion gap: 6 (ref 5–15)
BUN: 17 mg/dL (ref 8–23)
CO2: 26 mmol/L (ref 22–32)
Calcium: 8.5 mg/dL — ABNORMAL LOW (ref 8.9–10.3)
Chloride: 110 mmol/L (ref 98–111)
Creatinine, Ser: 0.8 mg/dL (ref 0.44–1.00)
GFR, Estimated: >60 mL/min (ref >60)
Glucose, Bld: 100 mg/dL — ABNORMAL HIGH (ref 70–99)
Potassium: 3.9 mmol/L (ref 3.5–5.1)
Sodium: 142 mmol/L (ref 135–145)

## 2023-09-27 NOTE — Progress Notes (Signed)
  Progress Note   Patient: [name] UJW:[id number] DOB: [birth year] DOA: 09/25/2023     1 DOS: the patient was seen and examined on 09/27/2023   Brief hospital course:  "Ms. Christiann Hagerty is a 73 year old female with history of anxiety, fibromyalgia, hypertension, depression, hyperlipidemia, who presents ED for chief concerns of fall from standing position at [facility]. "  Patient was found to have a left nondisplaced intertrochanteric fracture about the femoral prosthesis.  Patient was admitted for pain control and consultation with Orthopedic Surgery.  Further hospital course and management as outlined below.    Assessment and Plan:  * Inadequate pain control Closed left femoral fracture  Secondary to mechanical fall causing left nondisplaced intertrochanteric fracture about the femoral prosthesis. --Pain control per orders -- scheduled Tylenol  was added --Ortho consulted -- they discussed conservative versus surgical management. Patient and family elected for conservative path at this time. --Fall precautions --Bowel regimen --Follow up with Ortho in 2 weeks for repeat x-rays --50% partial weight bearing on LLE with rolling walker --Left hip Abduction precautions --PT/OT evaluations, anticipate need for SNF/rehab   Acute hypoxic respiratory failure (HCC) Suspect secondary to fentanyl  75 mcg IV one-time dose --O2 supplementation to maintain SpO2 >90% --Continuous pulse oximetry  Hypokalemia - K 2.9 --40 mEq Kcl x 2 doses to replace --Repeat BMP in AM --Check Mg level  Anxiety and depression --Continue home clonazepam , sertraline , bupropion     Chronic pain PDMP reviewed Current active/most recent prescription: Clonazepam  0.5 mg, quantity 30, 30-day supply; tramadol  50 mg, 14, 5-day supply   HTN (hypertension) --Hydralazine  5 mg IV PRN for now   Hyperlipidemia, unspecified --Pravastatin        Subjective: Pt awake resting in bed when seen this AM. She  reports pain control is improved today, much better.  Asking to get pulled up in the bed.  Pt denies other acute complaints.    Physical Exam: Vitals:   09/26/23 1429 09/26/23 2038 09/27/23 0407 09/27/23 0749  BP: (!) 122/58 (!) 123/54 125/78 133/63  Pulse: 94 92 87 86  Resp: 16 18 17 18   Temp: 98.2 F (36.8 C) 98.4 F (36.9 C) 97.8 F (36.6 C) 98 F (36.7 C)  TempSrc:  Oral    SpO2: 95% 96% 96% 99%  Weight:       General exam: awake, alert, no acute distress HEENT: moist mucus membranes, hearing grossly normal  Respiratory system: CTAB, no wheezes, rales or rhonchi, normal respiratory effort. Cardiovascular system: normal S1/S2, RRR, no pedal edema.   Gastrointestinal system: soft, NT, ND Central nervous system: A&O x3. no gross focal neurologic deficits, normal speech Extremities: no edema, normal tone Skin: dry, intact, normal temperature Psychiatry: normal mood, congruent affect, judgement and insight appear normal   Data Reviewed:  Notable labs--  Normal BMP except glucose 100, Ca 8.6   Family Communication: None present. Pt updated in detail.   Disposition: Status is: Inpatient Remains inpatient appropriate because: needs SNF placement     Planned Discharge Destination: Skilled nursing facility    Time spent: 35 minutes  Author: Montey Apa, DO 09/27/2023 12:13 PM  For on call review [email or web address].

## 2023-09-27 NOTE — Progress Notes (Signed)
 Physical Therapy Treatment Patient Details Name: [name] MRN: [id number] DOB: July 26, 1950 Today's Date: 09/27/2023   History of Present Illness Pt is a 73 year old female who presents ED for chief concerns of fall from standing position at [facility]. 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 She is alert and able to follow commands however disoriented x 3. Pt endorses no pain at rest but 6-7/10 with movements. Per son, [facility] will not be accepting pt straight back from acute hospital.Son also reports pt's cognition seems to at her baseline. She has had STR 20 days thus far this year but family prepared to pay co-pays for return to STR in hopes to maximize her safety with all mobility. Pt was able to tolerate getting OOB to recliner and performing ther ex but only ~ 1.5 hours prior to sliding in chair and needing to return to bed. Acute PT will continue to follow and progress per current POC.    If plan is discharge home, recommend the following: Two people to help with walking and/or transfers;Two people to help with bathing/dressing/bathroom;Assistance with cooking/housework;Supervision due to cognitive status;Help with stairs or ramp for entrance     Equipment Recommendations  Other (comment)       Precautions / Restrictions Precautions Precautions: Fall Recall of Precautions/Restrictions: Impaired Restrictions Weight Bearing Restrictions Per Provider Order: Yes LLE Weight Bearing Per Provider Order: Partial weight bearing LLE Partial Weight Bearing Percentage or Pounds: 50% Other Position/Activity Restrictions: No abduction     Mobility  Bed Mobility Overal bed mobility: Needs Assistance Bed Mobility: Supine to Sit, Sit to Supine  Supine to sit: Max assist, Total assist, Used rails Sit to supine: Total assist    Transfers Overall transfer level: Needs assistance Equipment used: Rolling walker (2 wheels) Transfers: Sit to/from Stand, Bed to chair/wheelchair/BSC Sit to Stand: Max assist, From elevated surface Stand pivot transfers: Max assist  General transfer comment: Max assist to stand, max assist to stand pivot. Highly recommend +2 assistance for all transfer. pt sliding out of recliner and required total assist to slide anterior prior to lowering LE to transfer back to bed    Ambulation/Gait  General Gait Details: Currently unable    Balance Overall balance assessment: Needs assistance Sitting-balance support: Feet supported, Bilateral upper extremity supported Sitting balance-Leahy Scale: Fair     Standing balance support: Bilateral upper extremity supported, During functional activity, Reliant on assistive device for balance Standing balance-Leahy Scale: Poor       Communication Communication Communication: No apparent difficulties  Cognition Arousal: Alert Behavior During Therapy: WFL for tasks assessed/performed, Anxious   PT - Cognitive impairments: History of cognitive impairments    PT - Cognition Comments: Pt was only oriented x 1 Following commands: Intact Following commands impaired: Follows one step commands with increased time    Cueing Cueing Techniques: Verbal cues, Tactile cues, Gestural cues     General Comments General comments (skin integrity, edema, etc.): Gave pt HEP handout and she perform a few but fell asleep during.      Pertinent Vitals/Pain Pain Assessment Pain Assessment: 0-10 Pain Score: 6  Pain Location: L hip Pain Descriptors / Indicators: Aching, Discomfort, Grimacing, Guarding Pain Intervention(s): Limited activity within patient's tolerance, Monitored during session, Premedicated before session, Repositioned, Ice applied     PT Goals (current goals can now be found in the care plan  section) Acute Rehab PT Goals Patient Stated Goal: none  stated Progress towards PT goals: Progressing toward goals    Frequency    7X/week       AM-PAC PT "6 Clicks" Mobility   Outcome Measure  Help needed turning from your back to your side while in a flat bed without using bedrails?: A Lot Help needed moving from lying on your back to sitting on the side of a flat bed without using bedrails?: A Lot Help needed moving to and from a bed to a chair (including a wheelchair)?: A Lot Help needed standing up from a chair using your arms (e.g., wheelchair or bedside chair)?: Total Help needed to walk in hospital room?: Total Help needed climbing 3-5 steps with a railing? : Total 6 Click Score: 9    End of Session   Activity Tolerance: Patient tolerated treatment well Patient left: in bed;with call bell/phone within reach;with bed alarm set Nurse Communication: Mobility status PT Visit Diagnosis: Muscle weakness (generalized) (M62.81);Difficulty in walking, not elsewhere classified (R26.2);Other abnormalities of gait and mobility (R26.89);History of falling (Z91.81);Pain Pain - Right/Left: Left Pain - part of body: Hip     Time: 9147-8295 PT Time Calculation (min) (ACUTE ONLY): 26 min  Charges:    $Therapeutic Exercise: 8-22 mins $Therapeutic Activity: 8-22 mins PT General Charges $$ ACUTE PT VISIT: 1 Visit                     Chester Costa PTA 09/27/23, 2:48 PM

## 2023-09-28 DIAGNOSIS — R52 Pain, unspecified: Secondary | ICD-10-CM | POA: Diagnosis not present

## 2023-09-28 NOTE — Progress Notes (Signed)
  Progress Note   Patient: [name] ZOX:[id number] DOB: 04-10-1951 DOA: 09/25/2023     2 DOS: the patient was seen and examined on 09/28/2023   Brief hospital course:  "Ms. Letesha Klecker is a 73 year old female with history of anxiety, fibromyalgia, hypertension, depression, hyperlipidemia, who presents ED for chief concerns of fall from standing position at [facility]. "  Patient was found to have a left nondisplaced intertrochanteric fracture about the femoral prosthesis.  Patient was admitted for pain control and consultation with Orthopedic Surgery.  Further hospital course and management as outlined below.    Assessment and Plan:  * Inadequate pain control Closed left femoral fracture  Secondary to mechanical fall causing left nondisplaced intertrochanteric fracture about the femoral prosthesis. --Pain control per orders -- scheduled Tylenol  was added --Ortho consulted -- they discussed conservative versus surgical management. Patient and family elected for conservative path at this time. --Fall precautions --Bowel regimen --Follow up with Ortho in 2 weeks for repeat x-rays --50% partial weight bearing on LLE with rolling walker --Left hip Abduction precautions --PT/OT evaluations, anticipate need for SNF/rehab   Acute hypoxic respiratory failure (HCC) Suspect secondary to fentanyl  75 mcg IV one-time dose --O2 supplementation to maintain SpO2 >90% --Continuous pulse oximetry  Hypokalemia - K 2.9 was replaced >> 3.9. Mg 2.0 --Monitor BMP   Anxiety and depression --Continue home clonazepam , sertraline , bupropion     Chronic pain PDMP reviewed Current active/most recent prescription: Clonazepam  0.5 mg, quantity 30, 30-day supply; tramadol  50 mg, 14, 5-day supply   HTN (hypertension) --Hydralazine  5 mg IV PRN for now   Hyperlipidemia, unspecified --Pravastatin        Subjective: Pt awake resting in bed this AM, needing to get pulled up in the bed. Asks for  bedside table to be moved to the side. She reports not having any pain at rest, but a lot of pain with any movement still.  No other acute complaints.    Physical Exam: Vitals:   09/27/23 1600 09/27/23 1939 09/28/23 0441 09/28/23 0751  BP: 130/63 (!) 129/51 121/69 113/61  Pulse: 98 93 88 85  Resp: 20 16 18 16   Temp: 98.5 F (36.9 C) 98.4 F (36.9 C) 98.5 F (36.9 C) 97.7 F (36.5 C)  TempSrc: Oral Oral  Oral  SpO2: 100% 94% 96% 97%  Weight:       General exam: awake, alert, no acute distress HEENT: moist mucus membranes, hearing grossly normal  Respiratory system: on room air, normal respiratory effort. Cardiovascular system: RRR, no pedal edema.   Gastrointestinal system: soft, NT, ND Central nervous system: A&O x3. no gross focal neurologic deficits, normal speech Extremities: no edema, normal tone Skin: dry, intact, normal temperature Psychiatry: normal mood, congruent affect, judgement and insight appear normal   Data Reviewed: No new labs today  Notable labs 5/31--  Normal BMP except glucose 100, Ca 8.6   Family Communication: None present. Pt updated in detail.   Disposition: Status is: Inpatient Remains inpatient appropriate because: needs SNF placement     Planned Discharge Destination: Skilled nursing facility    Time spent: 35 minutes  Author: Montey Apa, DO 09/28/2023 11:45 AM  For on call review [email or web address].

## 2023-09-28 NOTE — TOC Progression Note (Signed)
 Transition of Care [facility]) - Progression Note    Patient Details  Name: CELESTA FUNDERBURK MRN: [id number] Date of Birth: [birth year]  Transition of [facility]) CM/SW Contact  Alexandra Ice, RN Phone Number: 09/28/2023, 3:16 PM  Clinical Narrative:    Wilhelm Hansen, son, discussed discharge plan. He is agreeable to SNF placement, and would like for her to return to [facility].  He understands patient will be in co-pay days and is able to pay the copay amount. He stated that [facility] will be unable to accept back because patient will only be able to turn and pivot and transfer to chair. She may need long-term placement. They are in the middle spending down her income to try and qualify her for Medicaid. TOC will send referral to [facility] for assistance.        Expected Discharge Plan and Services                                               Social Determinants of Health (SDOH) Interventions SDOH Screenings   Food Insecurity: No Food Insecurity (09/25/2023)  Housing: Low Risk  (09/25/2023)  Transportation Needs: No Transportation Needs (09/25/2023)  Utilities: Not At Risk (09/25/2023)  Social Connections: Moderately Integrated (09/25/2023)  Tobacco Use: Medium Risk (09/25/2023)    Readmission Risk Interventions     No data to display

## 2023-09-28 NOTE — Progress Notes (Signed)
 Physical Therapy Treatment Patient Details Name: [name] MRN: [id number] DOB: May 23, 1950 Today's Date: 09/28/2023   History of Present Illness Pt is a 73 year old female who presents ED for chief concerns of fall from standing position at [facility]. 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  No ABD per orders.  She is assisted to EOB with bed features and heavy mod/max a x 1 with increased participation and ability to assist today.  Steady in sitting for LE ex and awaiting tech to assist.  She is able to stand with mod a x 2 and takes very poor quality steps to chair with heavy encouragement and max a to guide hips to chair.  She does remain up in chair with needs met.  Tech encouraged to use Stedy and +2 back to bed for safety and comfort of pt.   If plan is discharge home, recommend the following: Two people to help with walking and/or transfers;Two people to help with bathing/dressing/bathroom;Assistance with cooking/housework;Supervision due to cognitive status;Help with stairs or ramp for entrance   Can travel by private vehicle        Equipment Recommendations       Recommendations for Other Services       Precautions / Restrictions Precautions Precautions: Fall Recall of Precautions/Restrictions: Impaired Restrictions Weight Bearing Restrictions Per Provider Order: Yes LLE Weight Bearing Per Provider Order: Partial weight bearing LLE Partial Weight Bearing Percentage or Pounds: 50% Other Position/Activity Restrictions: No abduction     Mobility  Bed Mobility Overal bed mobility: Needs Assistance Bed Mobility: Supine to Sit     Supine to sit: Mod assist, Max assist, HOB elevated       Patient Response: Cooperative  Transfers Overall transfer level: Needs assistance Equipment used: Rolling walker (2  wheels) Transfers: Sit to/from Stand Sit to Stand: Mod assist, +2 physical assistance                Ambulation/Gait Ambulation/Gait assistance: Mod assist, +2 physical assistance Gait Distance (Feet): 2 Feet Assistive device: Rolling walker (2 wheels) Gait Pattern/deviations: Step-to pattern Gait velocity: dec     General Gait Details: poor quality steps to turn to chair   Stairs             Wheelchair Mobility     Tilt Bed Tilt Bed Patient Response: Cooperative  Modified Rankin (Stroke Patients Only)       Balance Overall balance assessment: Needs assistance Sitting-balance support: Feet supported, Bilateral upper extremity supported Sitting balance-Leahy Scale: Fair     Standing balance support: Bilateral upper extremity supported, During functional activity, Reliant on assistive device for balance Standing balance-Leahy Scale: Poor Standing balance comment: Heavy BUE support, 2 person assist.                            Communication Communication Communication: No apparent difficulties Factors Affecting Communication: Hearing impaired  Cognition Arousal: Alert Behavior During Therapy: WFL for tasks assessed/performed, Anxious   PT - Cognitive impairments: History of cognitive impairments                         Following commands: Intact Following commands impaired: Follows one step commands with increased time    Cueing Cueing Techniques: Verbal cues, Tactile cues, Gestural cues  Exercises Other Exercises Other Exercises: supine  heel slides L and isometric ex    General Comments        Pertinent Vitals/Pain Pain Assessment Pain Assessment: Faces Faces Pain Scale: Hurts even more Pain Location: L hip Pain Descriptors / Indicators: Aching, Discomfort, Grimacing, Guarding Pain Intervention(s): Limited activity within patient's tolerance, Monitored during session, Repositioned    Home Living                           Prior Function            PT Goals (current goals can now be found in the care plan section) Progress towards PT goals: Progressing toward goals    Frequency    7X/week      PT Plan      Co-evaluation              AM-PAC PT "6 Clicks" Mobility   Outcome Measure  Help needed turning from your back to your side while in a flat bed without using bedrails?: A Lot Help needed moving from lying on your back to sitting on the side of a flat bed without using bedrails?: A Lot Help needed moving to and from a bed to a chair (including a wheelchair)?: A Lot Help needed standing up from a chair using your arms (e.g., wheelchair or bedside chair)?: Total Help needed to walk in hospital room?: Total Help needed climbing 3-5 steps with a railing? : Total 6 Click Score: 9    End of Session Equipment Utilized During Treatment: Gait belt Activity Tolerance: Patient tolerated treatment well;Patient limited by fatigue Patient left: in chair;with chair alarm set;with call bell/phone within reach Nurse Communication: Mobility status PT Visit Diagnosis: Muscle weakness (generalized) (M62.81);Difficulty in walking, not elsewhere classified (R26.2);Other abnormalities of gait and mobility (R26.89);History of falling (Z91.81);Pain Pain - Right/Left: Left Pain - part of body: Hip     Time: 0454-0981 PT Time Calculation (min) (ACUTE ONLY): 13 min  Charges:    $Therapeutic Activity: 8-22 mins PT General Charges $$ ACUTE PT VISIT: 1 Visit                   Charlanne Cong, PTA 09/28/23, 1:28 PM

## 2023-09-28 NOTE — NC FL2 (Signed)
 [place]  MEDICAID FL2 LEVEL OF CARE FORM     IDENTIFICATION  Patient Name: [name] Birthdate: Apr 16, 1951 Sex: female Admission Date (Current Location): 09/25/2023  [facility] and [place] Number:  Chiropodist and Address:  [facility], [address] [place], [place] [address]      Provider Number: [id number]  Attending Physician Name and Address:  Montey Apa, DO  Relative Name and Phone Number:  Son: Eulalio Hibbs [phone]    Current Level of Care: Hospital Recommended Level of Care: Skilled Nursing Facility Prior Approval Number:    Date Approved/Denied:   PASRR Number: [id number]  Discharge Plan: SNF    Current Diagnoses: Patient Active Problem List   Diagnosis Date Noted   Hypokalemia 09/26/2023   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) 09/25/2023   Inadequate pain control 09/25/2023   Closed left femoral fracture (HCC) 09/25/2023   Acute hypoxic respiratory failure (HCC) 09/25/2023   Memory loss 08/12/2023   Acute cystitis without hematuria 08/10/2023   Fall 08/10/2023   Generalized weakness 08/09/2023   Chronic pain 08/09/2023   AKI (acute kidney injury) (HCC) 08/09/2023   Anxiety and depression 08/09/2023   Hyperlipidemia, unspecified 12/12/2022   HTN (hypertension) 12/12/2022   Benign essential tremor syndrome 04/11/2016   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   Expected blood loss anemia 02/21/2012   Obese 02/21/2012   Hyponatremia 02/21/2012   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/2012    Orientation RESPIRATION BLADDER Height & Weight     Self  Normal Incontinent Weight: 72.6 kg Height:     BEHAVIORAL SYMPTOMS/MOOD NEUROLOGICAL BOWEL NUTRITION STATUS      Incontinent Diet (Heart healthy, thin liquid)  AMBULATORY STATUS COMMUNICATION OF NEEDS Skin   Extensive Assist Verbally Normal                       Personal Care Assistance Level of Assistance  Bathing, Feeding,  Dressing Bathing Assistance: Maximum assistance Feeding assistance: Maximum assistance Dressing Assistance: Maximum assistance     Functional Limitations Info             SPECIAL CARE FACTORS FREQUENCY  PT (By licensed PT), OT (By licensed OT)     PT Frequency: 5 times per week OT Frequency: 5 times per week            Contractures Contractures Info: Not present    Additional Factors Info  Code Status, Allergies Code Status Info: Full Code Allergies Info: Erythromycin, Methocarbamol, Prednisone, Sulfa           Current Medications (09/28/2023):  This is the current hospital active medication list Current Facility-Administered Medications  Medication Dose Route Frequency Provider Last Rate Last Admin   acetaminophen  (TYLENOL ) tablet 1,000 mg  1,000 mg Oral Q8H Griffith, Kelly A, DO   1,000 mg at 09/28/23 1413   bisacodyl  (DULCOLAX) suppository 10 mg  10 mg Rectal Daily PRN Cox, Amy N, DO       buPROPion  ER (WELLBUTRIN  SR) 12 hr tablet 100 mg  100 mg Oral BID Cox, Amy N, DO   100 mg at 09/28/23 7253   clonazePAM  (KLONOPIN ) tablet 0.5 mg  0.5 mg Oral QHS Cox, Amy N, DO   0.5 mg at 09/27/23 2250   cyanocobalamin  (VITAMIN B12) tablet 1,000 mcg  1,000 mcg Oral Daily Cox, Amy N, DO   1,000 mcg at 09/28/23 0903   docusate sodium  (COLACE) capsule 100 mg  100 mg Oral  Daily Cox, Amy N, DO   100 mg at 09/28/23 8119   enoxaparin  (LOVENOX ) injection 40 mg  40 mg Subcutaneous Q24H Cox, Amy N, DO   40 mg at 09/27/23 2249   hydrALAZINE  (APRESOLINE ) injection 5 mg  5 mg Intravenous Q6H PRN Cox, Amy N, DO       ondansetron  (ZOFRAN ) tablet 4 mg  4 mg Oral Q6H PRN Cox, Amy N, DO       Or   ondansetron  (ZOFRAN ) injection 4 mg  4 mg Intravenous Q6H PRN Cox, Amy N, DO       polyethylene glycol (MIRALAX  / GLYCOLAX ) packet 17 g  17 g Oral Daily Cox, Amy N, DO   17 g at 09/28/23 0903   pravastatin  (PRAVACHOL ) tablet 80 mg  80 mg Oral QHS Cox, Amy N, DO   80 mg at 09/27/23 2250   senna-docusate  (Senokot-S) tablet 1 tablet  1 tablet Oral QHS PRN Cox, Amy N, DO   1 tablet at 09/27/23 2250   sertraline  (ZOLOFT ) tablet 150 mg  150 mg Oral Daily Cox, Amy N, DO   150 mg at 09/28/23 1478   traMADol  (ULTRAM ) tablet 50 mg  50 mg Oral Q8H PRN Cox, Amy N, DO   50 mg at 09/28/23 2956     Discharge Medications: Please see discharge summary for a list of discharge medications.  Relevant Imaging Results:  Relevant Lab Results:   Additional Information SSN: [id number]  Alexandra Ice, RN

## 2023-09-28 NOTE — TOC Progression Note (Signed)
 Transition of Care [facility]) - Progression Note    Patient Details  Name: [name] MRN: [id number] Date of Birth: [birth year]  Transition of Care [facility]) CM/SW Contact  Alexandra Ice, RN Phone Number: 09/28/2023, 4:30 PM  Clinical Narrative:     PASRR completed. FL2 pending MD signature, once signed, will send SNF referrals       Expected Discharge Plan and Services                                               Social Determinants of Health (SDOH) Interventions SDOH Screenings   Food Insecurity: No Food Insecurity (09/25/2023)  Housing: Low Risk  (09/25/2023)  Transportation Needs: No Transportation Needs (09/25/2023)  Utilities: Not At Risk (09/25/2023)  Social Connections: Moderately Integrated (09/25/2023)  Tobacco Use: Medium Risk (09/25/2023)    Readmission Risk Interventions     No data to display

## 2023-09-29 DIAGNOSIS — R52 Pain, unspecified: Secondary | ICD-10-CM | POA: Diagnosis not present

## 2023-09-29 LAB — BASIC METABOLIC PANEL WITH GFR
Anion gap: 7 (ref 5–15)
BUN: 17 mg/dL (ref 8–23)
CO2: 26 mmol/L (ref 22–32)
Calcium: 8.1 mg/dL — ABNORMAL LOW (ref 8.9–10.3)
Chloride: 105 mmol/L (ref 98–111)
Creatinine, Ser: 0.76 mg/dL (ref 0.44–1.00)
GFR, Estimated: >60 mL/min (ref >60)
Glucose, Bld: 91 mg/dL (ref 70–99)
Potassium: 3.9 mmol/L (ref 3.5–5.1)
Sodium: 138 mmol/L (ref 135–145)

## 2023-09-29 MED ORDER — SENNOSIDES-DOCUSATE SODIUM 8.6-50 MG PO TABS: 1.0000 | ORAL_TABLET | Freq: Two times a day (BID) | ORAL | Status: AC | PRN

## 2023-09-29 MED ORDER — CLONAZEPAM 0.5 MG PO TABS: 0.5000 mg | ORAL_TABLET | Freq: Every day | ORAL | 0 refills | Status: AC

## 2023-09-29 MED ORDER — SENNOSIDES-DOCUSATE SODIUM 8.6-50 MG PO TABS: 1.0000 | ORAL_TABLET | Freq: Two times a day (BID) | ORAL | Status: DC

## 2023-09-29 MED ORDER — TRAMADOL HCL 50 MG PO TABS: 50.0000 mg | ORAL_TABLET | Freq: Three times a day (TID) | ORAL | 0 refills | Status: AC | PRN

## 2023-09-29 MED ORDER — ACETAMINOPHEN 500 MG PO TABS: 1000.0000 mg | ORAL_TABLET | Freq: Three times a day (TID) | ORAL | Status: AC

## 2023-09-29 MED ORDER — ENOXAPARIN SODIUM 40 MG/0.4ML IJ SOSY: 40.0000 mg | PREFILLED_SYRINGE | INTRAMUSCULAR | Status: AC

## 2023-09-29 NOTE — Progress Notes (Signed)
  Progress Note   Patient: [name] UEA:[id number] DOB: June 22, 1950 DOA: 09/25/2023     3 DOS: the patient was seen and examined on 09/29/2023   Brief hospital course:  "[name] is a 73 year old female with history of anxiety, fibromyalgia, hypertension, depression, hyperlipidemia, who presents ED for chief concerns of fall from standing position at [facility]. "  Patient was found to have a left nondisplaced intertrochanteric fracture about the femoral prosthesis.  Patient was admitted for pain control and consultation with Orthopedic Surgery.  Further hospital course and management as outlined below.    Assessment and Plan:  * Inadequate pain control Closed left femoral fracture  Secondary to mechanical fall causing left nondisplaced intertrochanteric fracture about the femoral prosthesis. --Pain control per orders -- scheduled Tylenol  was added --Ortho consulted -- they discussed conservative versus surgical management. Patient and family elected for conservative path at this time. --Fall precautions --Bowel regimen --Follow up with Ortho in 2 weeks for repeat x-rays --50% partial weight bearing on LLE with rolling walker --Left hip Abduction precautions --PT/OT evaluations, anticipate need for SNF/rehab   Acute hypoxic respiratory failure (HCC) Suspect secondary to fentanyl  75 mcg IV one-time dose --O2 supplementation to maintain SpO2 >90% --Continuous pulse oximetry  Hypokalemia - K 2.9 was replaced >> 3.9. Mg 2.0 --Monitor BMP   Anxiety and depression --Continue home clonazepam , sertraline , bupropion     Chronic pain PDMP reviewed Current active/most recent prescription: Clonazepam  0.5 mg, quantity 30, 30-day supply; tramadol  50 mg, 14, 5-day supply   HTN (hypertension) --Hydralazine  5 mg IV PRN for now   Hyperlipidemia, unspecified --Pravastatin        Subjective: Pt awake resting in bed this AM. She denies pain or other acute  complaints.   Physical Exam: Vitals:   09/28/23 1613 09/28/23 2037 09/29/23 0456 09/29/23 0839  BP: (!) 158/68 (!) 124/54 (!) 128/53 134/71  Pulse: 86 86 82 88  Resp: 16 18 18 16   Temp: 98.1 F (36.7 C) 98.2 F (36.8 C) (!) 97.5 F (36.4 C) 98 F (36.7 C)  TempSrc: Oral Oral Oral Oral  SpO2: 97% 97% 97% 96%  Weight:       General exam: awake, alert, no acute distress HEENT: moist mucus membranes, hearing grossly normal  Respiratory system: on room air, normal respiratory effort. Cardiovascular system: RRR, no pedal edema.   Gastrointestinal system: soft, NT, ND Central nervous system: A&O x3. no gross focal neurologic deficits, normal speech Extremities: no edema, normal tone Skin: dry, intact, normal temperature Psychiatry: normal mood, congruent affect, judgement and insight appear normal   Data Reviewed:  Notable labs --   Normal BMP except Ca 8.1  Family Communication: None present. Pt updated in detail.   Disposition: Status is: Inpatient Remains inpatient appropriate because: needs SNF placement     Planned Discharge Destination: Skilled nursing facility    Time spent: 35 minutes  Author: Montey Apa, DO 09/29/2023 11:20 AM  For on call review [email or web address].

## 2023-09-29 NOTE — Discharge Summary (Addendum)
 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 [facility] Course:  [name] is a 73 year old female with history of anxiety, fibromyalgia, hypertension, depression, hyperlipidemia, who presents ED for chief concerns of fall from standing position at [facility].    Patient was found to have a left nondisplaced intertrochanteric fracture about the femoral prosthesis.  Patient was admitted for pain control and consultation with Orthopedic Surgery.   Further hospital course and management as outlined below.   6/2 -- pt doing well today.  Medically stable for discharge to rehab.   Assessment and Plan:  * Inadequate pain control Closed left femoral fracture  Secondary to mechanical fall causing left nondisplaced intertrochanteric fracture about the femoral prosthesis. --Pain control per orders -- scheduled Tylenol , PRN tramadol  --Ortho consulted -- they discussed conservative versus surgical management. Patient and family elected for conservative path at this time. --Fall precautions --Bowel regimen --Follow up with Ortho in 2 weeks for repeat x-rays --50% partial weight bearing on LLE with rolling walker --Left hip Abduction  precautions --PT/OT evaluations, anticipate need for SNF/rehab   Hypoxia -  Suspect secondary to fentanyl  75 mcg IV one-time dose --O2 supplementation to maintain SpO2 >90% Stable on room air   Hypokalemia - K 2.9 was replaced >> 3.9 last two. Mg 2.0 --Monitor BMP at follow up   Anxiety and depression --Continue home clonazepam , sertraline , bupropion     Chronic pain PDMP reviewed Current active/most recent prescription: Clonazepam  0.5 mg, quantity 30, 30-day supply; tramadol  50 mg, 14, 5-day supply   HTN (hypertension) --Hydralazine  5 mg IV PRN for now   Hyperlipidemia, unspecified --Pravastatin         Consultants: Orhto Procedures performed: None   Disposition: Skilled nursing facility Diet recommendation:  Cardiac diet   DISCHARGE MEDICATION: Allergies as of 09/29/2023       Reactions   Sulfa Antibiotics Other (See Comments), Dermatitis   Turns eyes red Substance with sulfonamide structure and antibacterial mechanism of action (substance)   Erythromycin Other (See Comments)   Upset stomach   Methocarbamol Nausea Only   Prednisone         Medication List     TAKE these medications    acetaminophen  500 MG tablet Commonly known as: TYLENOL  Take 2 tablets (1,000 mg total) by mouth every 8 (eight) hours. What changed:  medication strength how much to take when to take this reasons to take this   bisacodyl  10 MG suppository Commonly known as: Bisacodyl  Laxative Place 1 suppository (10 mg total) rectally daily as needed for moderate constipation or severe constipation.   buPROPion  ER 100 MG 12 hr tablet Commonly known as: WELLBUTRIN  SR Take 100 mg by mouth 2 (two) times daily.  cetirizine 10 MG tablet Commonly known as: ZYRTEC Take 10 mg by mouth daily as needed for allergies.   clonazePAM  0.5 MG tablet Commonly known as: KLONOPIN  Take 1 tablet (0.5 mg total) by mouth at bedtime.   Colace 100 MG capsule Generic drug: docusate sodium  Take 100  mg by mouth daily.   cyanocobalamin  1000 MCG tablet Commonly known as: VITAMIN B12 Take 1,000 mcg by mouth daily.   enoxaparin  40 MG/0.4ML injection Commonly known as: LOVENOX  Inject 0.4 mLs (40 mg total) into the skin daily for 28 days.   estradiol 0.1 MG/24HR patch Commonly known as: VIVELLE-DOT Place 1 patch onto the skin 2 (two) times a week.   Fish Oil 300 MG Caps Take 1 capsule by mouth daily.   folic acid 400 MCG tablet Commonly known as: FOLVITE Take 400 mcg by mouth daily.   omeprazole 20 MG capsule Commonly known as: PRILOSEC Take 20 mg by mouth daily.   polyethylene glycol 17 g packet Commonly known as: MiraLax  Take 17 g by mouth daily.   pravastatin  80 MG tablet Commonly known as: PRAVACHOL  Take 80 mg by mouth at bedtime.   senna-docusate 8.6-50 MG tablet Commonly known as: Senokot-S Take 1 tablet by mouth 2 (two) times daily as needed for moderate constipation.   sertraline  100 MG tablet Commonly known as: ZOLOFT  Take 150 mg by mouth daily.   traMADol  50 MG tablet Commonly known as: Ultram  Take 1 tablet (50 mg total) by mouth every 8 (eight) hours as needed for moderate pain (pain score 4-6) or severe pain (pain score 7-10).   Trospium  Chloride 60 MG Cp24 Take 1 capsule (60 mg total) by mouth daily.   Vitamin D 50 MCG (2000 UT) tablet Take 2,000 Units by mouth daily.        Contact information for after-discharge care     Destination     [facility] Preferred SNF .   Service: Skilled Nursing Contact information: [address] [place]  [address] [phone]                    Discharge Exam: Cleavon Curls Weights   09/25/23 1814  Weight: 72.6 kg   General exam: awake, alert, no acute distress HEENT: moist mucus membranes, hearing grossly normal  Respiratory system: CTAB, no wheezes, rales or rhonchi, normal respiratory  effort. Cardiovascular system: normal S1/S2, RRR Gastrointestinal system: soft, NT, ND Central nervous system: A&O x2+. no gross focal neurologic deficits, normal speech Extremities: no edema, normal tone Skin: dry, intact, normal temperature Psychiatry: normal mood, congruent affect   Condition at discharge: stable  The results of significant diagnostics from this hospitalization (including imaging, microbiology, ancillary and laboratory) are listed below for reference.   Imaging Studies: CT Hip Left Wo Contrast Result Date: 09/25/2023 CLINICAL DATA:  Hip trauma. EXAM: CT OF THE LEFT HIP WITHOUT CONTRAST TECHNIQUE: Multidetector CT imaging of the left hip was performed according to the standard protocol. Multiplanar CT image reconstructions were also generated. RADIATION DOSE REDUCTION: This exam was performed according to the departmental dose-optimization program which includes automated exposure control, adjustment of the mA and/or kV according to patient size and/or use of iterative reconstruction technique. COMPARISON:  Left hip radiographs dated Sep 25, 2023 at 1:11 p.m. CT pelvis dated 08/09/2023. FINDINGS: Bones/Joint/Cartilage Status post left total hip arthroplasty. Displaced periprosthetic intertrochanteric fracture of the left proximal femur with fracture margins extending to the medial, lateral, and  posterolateral surface of the proximal femoral stem component. There is approximately 3.6 cm of anterior displacement along the anterior fracture margin. Hardware is otherwise intact without dislocation. Acetabular component appears well seated. Pubic symphysis is anatomically aligned with degenerative changes. Ligaments Ligaments are suboptimally evaluated by CT. Soft tissue and Muscles Intramuscular edema with probable hematoma of the left adductor and left anterior compartment musculature of the thigh, predominantly involving the quadriceps musculature. Soft tissue swelling along the left  lateral hip. No radiopaque foreign body. No loculated fluid collection identified. IMPRESSION: 1. Displaced periprosthetic intertrochanteric fracture of the left proximal femur with fracture margins extending to the surface of the proximal femoral stem component, as above. 2. Intramuscular edema with probable hematoma of the left adductor and left anterior compartment musculature of the thigh, predominantly involving the quadriceps musculature. Soft tissue swelling along the left lateral hip. Electronically Signed   By: Mannie Seek M.D.   On: 09/25/2023 18:06   CT Head Wo Contrast Result Date: 09/25/2023 CLINICAL DATA:  Fall.  Blunt head trauma. EXAM: CT HEAD WITHOUT CONTRAST TECHNIQUE: Contiguous axial images were obtained from the base of the skull through the vertex without intravenous contrast. RADIATION DOSE REDUCTION: This exam was performed according to the departmental dose-optimization program which includes automated exposure control, adjustment of the mA and/or kV according to patient size and/or use of iterative reconstruction technique. COMPARISON:  08/09/2023 FINDINGS: Brain: No evidence of intracranial hemorrhage, acute infarction, hydrocephalus, extra-axial collection, or mass lesion/mass effect. Mild diffuse cerebral atrophy and moderate chronic small vessel disease show no significant change. Vascular:  No hyperdense vessel or other acute findings. Skull: No evidence of fracture or other significant bone abnormality. Sinuses/Orbits:  No acute findings. Other: Moderate left posterior parietal scalp hematoma is seen. IMPRESSION: Moderate left posterior parietal scalp hematoma. No evidence of skull fracture or acute intracranial abnormality. Stable cerebral atrophy and chronic small vessel disease. Electronically Signed   By: Marlyce Sine M.D.   On: 09/25/2023 17:53   DG Hip Unilat W or Wo Pelvis 2-3 Views Left Result Date: 09/25/2023 EXAM: 1 VIEW XRAY OF THE PELVIS 2 VIEWS XRAY OF THE LEFT  FEMUR 09/25/2023 01:11:00 PM COMPARISON: 08/09/2023 CLINICAL HISTORY: FINDINGS: BONES AND JOINTS: A nondisplaced intertrochanteric fracture is present about the femoral prosthesis. The prosthesis is intact. The hip is located. No additional fractures are present. SOFT TISSUES: The soft tissues are unremarkable. IMPRESSION: 1. Nondisplaced intertrochanteric fracture about the femoral prosthesis. 2. Intact prosthesis and located hip. 3. No additional fractures. Electronically signed by: Audree Leas MD 09/25/2023 02:57 PM EDT RP Workstation: [id number]    Microbiology: Results for orders placed or performed during the hospital encounter of 08/09/23  Urine Culture     Status: Abnormal   Collection Time: 08/09/23  3:10 PM   Specimen: Urine, Random  Result Value Ref Range Status   Specimen Description   Final    URINE, RANDOM Performed at [facility], [address], [place], [place] [address]    Special Requests   Final    URINE, CLEAN CATCH Performed at [facility], [address], [place], [place] [address]    Culture >=100,000 COLONIES/mL ESCHERICHIA COLI (A)  Final   Report Status 08/11/2023 FINAL  Final   Organism ID, Bacteria ESCHERICHIA COLI (A)  Final      Susceptibility   Escherichia coli - MIC*    AMPICILLIN >=32 RESISTANT Resistant     CEFAZOLIN  16 SENSITIVE Sensitive     CEFEPIME <=0.12  SENSITIVE Sensitive     CEFTRIAXONE  <=0.25 SENSITIVE Sensitive     CIPROFLOXACIN <=0.25 SENSITIVE Sensitive     GENTAMICIN >=16 RESISTANT Resistant     IMIPENEM <=0.25 SENSITIVE Sensitive     NITROFURANTOIN <=16 SENSITIVE Sensitive     TRIMETH/SULFA >=320 RESISTANT Resistant     AMPICILLIN/SULBACTAM >=32 RESISTANT Resistant     PIP/TAZO 64 INTERMEDIATE Intermediate ug/mL    * >=100,000 COLONIES/mL ESCHERICHIA COLI    Labs: CBC: Recent Labs  Lab 09/26/23 0520  WBC 7.2  HGB 9.5*  HCT 28.3*  MCV 90.1  PLT 205   Basic Metabolic Panel: Recent Labs  Lab  09/26/23 0520 09/27/23 0449 09/29/23 0239  NA 140 142 138  K 2.9* 3.9 3.9  CL 105 110 105  CO2 25 26 26   GLUCOSE 111* 100* 91  BUN 15 17 17   CREATININE 0.85 0.80 0.76  CALCIUM 8.6* 8.5* 8.1*  MG 2.0  --   --    Liver Function Tests: No results for input(s): AST, ALT, ALKPHOS, BILITOT, PROT, ALBUMIN in the last 168 hours. CBG: No results for input(s): GLUCAP in the last 168 hours.  Discharge time spent: less than 30 minutes.  Signed: Montey Apa, DO [facility] 09/29/2023

## 2023-09-29 NOTE — Progress Notes (Signed)
 Physical Therapy Treatment Patient Details Name: [name] MRN: [id number] DOB: [birth year] Today's Date: 09/29/2023   History of Present Illness Pt is a 73 year old female who presents ED for chief concerns of fall from standing position at [facility]. 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  Participated in exercises as described below.  She is able to get to EOB with heavy mod a x 1.  Steady in sitting for seated AROM.  Stood with +2 mod a and is able to take improved but still quite limited steps to recliner.  Positioned for comfort.   If plan is discharge home, recommend the following: Two people to help with walking and/or transfers;Two people to help with bathing/dressing/bathroom;Assistance with cooking/housework;Supervision due to cognitive status;Help with stairs or ramp for entrance   Can travel by private vehicle        Equipment Recommendations       Recommendations for Other Services       Precautions / Restrictions Precautions Precautions: Fall Recall of Precautions/Restrictions: Impaired Restrictions Weight Bearing Restrictions Per Provider Order: Yes LLE Weight Bearing Per Provider Order: Partial weight bearing LLE Partial Weight Bearing Percentage or Pounds: 50% Other Position/Activity Restrictions: No abduction     Mobility  Bed Mobility Overal bed mobility: Needs Assistance Bed Mobility: Supine to Sit     Supine to sit: Mod assist, Max assist, HOB elevated       Patient Response: Cooperative  Transfers Overall transfer level: Needs assistance Equipment used: Rolling walker (2 wheels) Transfers: Sit to/from Stand Sit to Stand: Mod assist, +2 physical assistance                Ambulation/Gait Ambulation/Gait assistance: Mod assist, +2 physical assistance Gait Distance (Feet): 3 Feet Assistive  device: Rolling walker (2 wheels) Gait Pattern/deviations: Step-to pattern Gait velocity: dec     General Gait Details: poor quality steps to turn to chair but overall improved from yesteray with better quality and less assist to complete transfer   Stairs             Wheelchair Mobility     Tilt Bed Tilt Bed Patient Response: Cooperative  Modified Rankin (Stroke Patients Only)       Balance Overall balance assessment: Needs assistance Sitting-balance support: Feet supported, Bilateral upper extremity supported Sitting balance-Leahy Scale: Fair     Standing balance support: Bilateral upper extremity supported, During functional activity, Reliant on assistive device for balance Standing balance-Leahy Scale: Poor Standing balance comment: Heavy BUE support, 2 person assist.                            Communication Communication Communication: No apparent difficulties Factors Affecting Communication: Hearing impaired  Cognition Arousal: Alert Behavior During Therapy: WFL for tasks assessed/performed, Anxious   PT - Cognitive impairments: History of cognitive impairments                         Following commands: Intact Following commands impaired: Follows one step commands with increased time    Cueing Cueing Techniques: Verbal cues, Tactile cues, Gestural cues  Exercises General Exercises - Lower Extremity Long Arc Quad: AROM, Strengthening, Left, 10 reps Other Exercises Other Exercises: supine heel slides L and isometric ex Other Exercises: seated AROM EOB    General Comments  Pertinent Vitals/Pain Pain Assessment Pain Assessment: Faces Faces Pain Scale: Hurts little more Pain Location: L hip Pain Descriptors / Indicators: Aching, Discomfort, Grimacing, Guarding Pain Intervention(s): Limited activity within patient's tolerance, Monitored during session, Repositioned, Premedicated before session    Home Living                           Prior Function            PT Goals (current goals can now be found in the care plan section) Progress towards PT goals: Progressing toward goals    Frequency    7X/week      PT Plan      Co-evaluation              AM-PAC PT "6 Clicks" Mobility   Outcome Measure  Help needed turning from your back to your side while in a flat bed without using bedrails?: A Lot Help needed moving from lying on your back to sitting on the side of a flat bed without using bedrails?: A Lot Help needed moving to and from a bed to a chair (including a wheelchair)?: A Lot Help needed standing up from a chair using your arms (e.g., wheelchair or bedside chair)?: A Lot Help needed to walk in hospital room?: Total Help needed climbing 3-5 steps with a railing? : Total 6 Click Score: 10    End of Session Equipment Utilized During Treatment: Gait belt Activity Tolerance: Patient tolerated treatment well;Patient limited by fatigue Patient left: in chair;with chair alarm set;with call bell/phone within reach Nurse Communication: Mobility status PT Visit Diagnosis: Muscle weakness (generalized) (M62.81);Difficulty in walking, not elsewhere classified (R26.2);Other abnormalities of gait and mobility (R26.89);History of falling (Z91.81);Pain Pain - Right/Left: Left Pain - part of body: Hip     Time: 9562-1308 PT Time Calculation (min) (ACUTE ONLY): 12 min  Charges:    $Therapeutic Activity: 8-22 mins PT General Charges $$ ACUTE PT VISIT: 1 Visit                   Charlanne Cong, PTA 09/29/23, 11:05 AM

## 2023-09-29 NOTE — Care Management Important Message (Signed)
 Important Message  Patient Details  Name: [name] MRN: [id number] Date of Birth: 18-Sep-1950   Important Message Given:  Yes - Medicare IM     Agusta Hackenberg W, CMA 09/29/2023, 2:10 PM

## 2023-09-29 NOTE — TOC Progression Note (Signed)
 Transition of Care [facility]) - Progression Note    Patient Details  Name: [name] MRN: [id number] Date of Birth: [birth year]  Transition of Care [facility]) CM/SW Contact  Alexandra Ice, RN Phone Number: 09/29/2023, 8:19 AM  Clinical Narrative:     [place] completed. Referral sent to [employer].        Expected Discharge Plan and Services                                               Social Determinants of Health (SDOH) Interventions SDOH Screenings   Food Insecurity: No Food Insecurity (09/25/2023)  Housing: Low Risk  (09/25/2023)  Transportation Needs: No Transportation Needs (09/25/2023)  Utilities: Not At Risk (09/25/2023)  Social Connections: Moderately Integrated (09/25/2023)  Tobacco Use: Medium Risk (09/25/2023)    Readmission Risk Interventions     No data to display

## 2023-09-29 NOTE — TOC Progression Note (Signed)
 Transition of Care [facility]) - Progression Note    Patient Details  Name: [name] MRN: [id number] Date of Birth: 1951-03-04  Transition of Care [facility]) CM/SW Contact  Alexandra Ice, RN Phone Number: 09/29/2023, 1:46 PM  Clinical Narrative:     Eldora Greet with Antony Baumgartner at [facility], discussed patient and family is able to pay for copay days. She stated family would have to pay $1466.50. TOC also discussed the possibility of patient going LTC, as [facility] may  not be able to accept back. She stated it would be $315/day for LTC bed. She is able to accept patient back but will need payment from family.  TOC contacted Eddie, son, and provided the copay amount, he stated he would be able to cover amount. He stated he spoke with [facility] and they are able to take patient back if she is able to turn and pivot.  TOC notified Antony Baumgartner at [employer] [employer] that son is able to pay, and confirmed will need discharge and orders by 3pm. Notified MD and bedside of discharge plan.       Expected Discharge Plan and Services                                               Social Determinants of Health (SDOH) Interventions SDOH Screenings   Food Insecurity: No Food Insecurity (09/25/2023)  Housing: Low Risk  (09/25/2023)  Transportation Needs: No Transportation Needs (09/25/2023)  Utilities: Not At Risk (09/25/2023)  Social Connections: Moderately Integrated (09/25/2023)  Tobacco Use: Medium Risk (09/25/2023)    Readmission Risk Interventions     No data to display

## 2023-09-29 NOTE — TOC Transition Note (Signed)
 Transition of Care [facility]) - Discharge Note   Patient Details  Name: [name] MRN: [id number] Date of Birth: [birth year]  Transition of Care [facility]) CM/SW Contact:  Alexandra Ice, RN Phone Number: 09/29/2023, 3:21 PM   Clinical Narrative:     Patient to discharge to [facility]. Discharge summary and orders sent to facility via [facility]. Spoke with Verdie Gladden at [facility], patient is #3 on schedule for pick up. EMS packet printed to nurse station. Patient going to room 611B, nurse to call report to [phone]. Contacted Eddied, let him know patient to discharge today. He stated he is on his way to drop of check to [employer] [employer]. Notified MD and bedside nurse.   Final next level of care: Skilled Nursing Facility Barriers to Discharge: Barriers Resolved   Patient Goals and CMS Choice   CMS Medicare.gov Compare Post Acute Care list provided to:: Patient Represenative (must comment) (Son: Eddie Rascoe) Choice offered to / list presented to : Adult Children      Discharge Placement                Patient to be transferred to facility by: [facility] Name of family member notified: Eddie Patient and family notified of of transfer: 09/29/23  Discharge Plan and Services Additional resources added to the After Visit Summary for                    DME Agency: NA       HH Arranged: NA          Social Drivers of Health (SDOH) Interventions SDOH Screenings   Food Insecurity: No Food Insecurity (09/25/2023)  Housing: Low Risk  (09/25/2023)  Transportation Needs: No Transportation Needs (09/25/2023)  Utilities: Not At Risk (09/25/2023)  Social Connections: Moderately Integrated (09/25/2023)  Tobacco Use: Medium Risk (09/25/2023)     Readmission Risk Interventions     No data to display

## 2024-03-10 ENCOUNTER — Ambulatory Visit: Payer: Self-pay | Admitting: Physician Assistant

## 2024-03-10 DIAGNOSIS — N3281 Overactive bladder: Secondary | ICD-10-CM
